# Patient Record
Sex: Male | Born: 1965 | Hispanic: No | Marital: Married | Smoking: Never smoker
Health system: Southern US, Community
[De-identification: ages and names within clinical notes are randomized; demographics above are authoritative.]

## PROBLEM LIST (undated history)

## (undated) DIAGNOSIS — I441 Atrioventricular block, second degree: Secondary | ICD-10-CM

## (undated) DIAGNOSIS — I639 Cerebral infarction, unspecified: Secondary | ICD-10-CM

## (undated) DIAGNOSIS — I4430 Unspecified atrioventricular block: Secondary | ICD-10-CM

## (undated) DIAGNOSIS — R001 Bradycardia, unspecified: Secondary | ICD-10-CM

## (undated) HISTORY — DX: Atrioventricular block, second degree: I44.1

## (undated) HISTORY — DX: Unspecified atrioventricular block: I44.30

## (undated) HISTORY — DX: Bradycardia, unspecified: R00.1

---

## 1998-10-30 ENCOUNTER — Other Ambulatory Visit: Admission: RE | Admit: 1998-10-30 | Discharge: 1998-10-30 | Payer: Self-pay | Admitting: Urology

## 2010-01-21 ENCOUNTER — Encounter: Payer: Self-pay | Admitting: Internal Medicine

## 2010-05-25 ENCOUNTER — Encounter: Payer: Self-pay | Admitting: Cardiology

## 2010-06-17 ENCOUNTER — Encounter: Payer: Self-pay | Admitting: Internal Medicine

## 2010-06-17 ENCOUNTER — Institutional Professional Consult (permissible substitution) (INDEPENDENT_AMBULATORY_CARE_PROVIDER_SITE_OTHER): Payer: BC Managed Care – PPO | Admitting: Internal Medicine

## 2010-06-17 DIAGNOSIS — I441 Atrioventricular block, second degree: Secondary | ICD-10-CM

## 2010-06-24 NOTE — Assessment & Plan Note (Signed)
Summary: sinoatrial node dysfunction/ [facility] [phone]/...   Vital Signs:  Patient profile:   45 year old male Height:      71 inches Weight:      152 pounds BMI:     21.28 Pulse rate:   45 / minute Resp:     16 per minute BP sitting:   131 / 84  (right arm)  Vitals Entered By: Marrion Coy, CNA (June 17, 2010 12:04 PM)  Visit Type:  Initial Consult Referring Provider:  Dr Anne Fu Primary Provider:  Dr Laurann Montana   History of Present Illness: Mr Coolman is a pleasant 45 yo WM with a h/o longstanding bradycardia who presents today for EP consultation.  He reports always noticing that his heart rate was "low".  He states that when giving blood in past (at least 10 years), his heart rate was almost always 40s.  Most recently he evaluated by Dr Cliffton Asters 9/11 for routine physical and was found to be bradycardic.  She did an ekg and found 2:1 AV conduction. The patient states that his energy is preserved.  He remains very active.  He will ride his bike up to 50 miles on occasion, without difficulty.  He finds mild SOB when going up hills, but is able to maintaing activies with good endurance.  He denies lightheadednss, dizziness, presyncope, or syncope.  He also denies CP, SOB, orthopnea, PND,  or edema.  He does not take medicines.  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  Maternal grandfather died suddenly at age 57 but no other FH of sudden death or arrhythmias.  THis grandfather weighed over 300 lbs and was felt to chronically be in poor health.  Social History: Pt lives in [place] with spouse.  Two healthy children.  Works as a Quarry manager.  Tob- none.  ETOH- rare.  Review of Systems       All  systems are reviewed and negative except as listed in the HPI.   Physical Exam  General:  Well developed, well nourished, in no acute distress. Head:  normocephalic and atraumatic Eyes:  PERRLA/EOM intact; conjunctiva and lids normal. Mouth:  Teeth, gums and palate normal. Oral mucosa normal. Neck:  Neck supple, no JVD. No masses, thyromegaly or abnormal cervical nodes. Lungs:  Clear bilaterally to auscultation and percussion. Heart:  brady regular rhythm, no m/r/g Abdomen:  Bowel sounds positive; abdomen soft and non-tender without masses, organomegaly, or hernias noted. No hepatosplenomegaly. Msk:  Back normal, normal gait. Muscle strength and tone normal. Extremities:  No clubbing or cyanosis. Neurologic:  Alert and oriented x 3. Skin:  Intact without lesions or rashes. Psych:  Normal affect.   Echocardiogram  Procedure date:  01/092012  Findings:      Upper normal left ventricular septal wall thickness. There were no regional wall motion abnormalities. Left ventricular ejection fraction est. at 65-70%. Mild mitral valve regugitation. Trivial tricuspid regurgitation. Mildly elevated estemated right ventricular systolic pressure. Upper normal RV size.  Donato Schultz, MD    Impression & Recommendations:  Problem # 1:  ATRIOVENTRICULAR BLOCK, 2ND DEGREE (ICD-426.13) The patient presents with second degree AV block.  He appears to be completely asymptomatic.  He reports heart rates in the 40s for 10 years  or more but has not previously had ekgs.  I suspect that this is therefore a longstanding issue for him.  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  I have reviewed his recent holter monitor in detail with reveals 2:1 AV block with occasional junctional escape beats, but no progression past 2:1 block. I also reviewed the patients GXT performed by Dr Anne Fu.  Interestingly he has persistent 2:1 AV block with  exercise.  He does not convert to 1:1 conduction or have progression to further heart block.  His Echo is benign.   Given that this is longstanding, QRS is very narrow, and he is asypmtomatic, I would not recommend pacemaker implantation at this time.  He will follow closely with Dr Anne Fu.  Should he begin to develop presyncope, syncope, or significant decline in exercise tolerance, then we should consider pacemaker at that time.  I have discussed the patient's case and reviewed ekgs/holters/gxt with both Dr Anne Fu and also Dr Ladona Ridgel who agree with my assessment and recomendations.  I will see the patient as needed.  Patient Instructions: 1)  Your physician recommends that you schedule a follow-up appointment with Dr. Anne Fu. Follow up with Dr. Johney Frame as needed.

## 2010-07-14 NOTE — Letter (Signed)
Summary: [facility] Cardiology 2011  [facility] Cardiology 2011   Imported By: Marylou Mccoy 07/09/2010 14:38:20  _____________________________________________________________________  External Attachment:    Type:   Image     Comment:   External Document

## 2019-08-24 ENCOUNTER — Telehealth: Payer: Self-pay

## 2019-08-24 NOTE — Telephone Encounter (Signed)
Called pt to update fam hx on medical record. Left message asking pt to call the office.

## 2019-08-28 ENCOUNTER — Encounter: Payer: Self-pay | Admitting: Cardiology

## 2019-08-28 ENCOUNTER — Ambulatory Visit: Payer: 59 | Admitting: Cardiology

## 2019-08-28 ENCOUNTER — Other Ambulatory Visit: Payer: Self-pay

## 2019-08-28 ENCOUNTER — Encounter: Payer: Self-pay | Admitting: *Deleted

## 2019-08-28 ENCOUNTER — Telehealth: Payer: Self-pay | Admitting: *Deleted

## 2019-08-28 VITALS — BP 124/90 | HR 46 | Ht 71.0 in | Wt 156.8 lb

## 2019-08-28 DIAGNOSIS — R001 Bradycardia, unspecified: Secondary | ICD-10-CM | POA: Diagnosis not present

## 2019-08-28 DIAGNOSIS — R9431 Abnormal electrocardiogram [ECG] [EKG]: Secondary | ICD-10-CM

## 2019-08-28 DIAGNOSIS — I441 Atrioventricular block, second degree: Secondary | ICD-10-CM | POA: Diagnosis not present

## 2019-08-28 NOTE — Progress Notes (Signed)
Cardiology Office Note:    Date:  08/28/2019   ID:  Luke Jimenez, DOB 26-Mar-1966, MRN [id number]  PCP:  Shon Hale, MD  Cardiologist:  No primary care provider on file.  Electrophysiologist:  None   Referring MD: Shon Hale, *     History of Present Illness:    Luke Jimenez is a 54 y.o. male here for the evaluation of AV block at the request of Dr. Chanetta Marshall.  Today EKG demonstrates second-degree AV block heart rate 46 bpm with 2-1 AV nodal conduction.  Secondary P wave is buried within the end of T wave.  Saw Dr. Johney Frame 2012. Now when going up hill more SOB, recover quickly. Changed a lot since then.  Please see below for further details.  Father died 39 from Parkinson's.  Maternal uncle had CABG age 47.  Never smoked.  He is a Architectural technologist. -LDL 148 creatinine 1.1 potassium 5.5  Past Medical History:  Diagnosis Date  . AV block   . Bradycardia     History reviewed. No pertinent surgical history.  Current Medications: Current Meds  Medication Sig  . cetirizine (ZYRTEC) 10 MG tablet Take 10 mg by mouth daily.     Allergies:   Patient has no known allergies.   Social History   Socioeconomic History  . Marital status: Unknown    Spouse name: Not on file  . Number of children: Not on file  . Years of education: Not on file  . Highest education level: Not on file  Occupational History  . Not on file  Tobacco Use  . Smoking status: Never Smoker  . Smokeless tobacco: Never Used  Substance and Sexual Activity  . Alcohol use: Yes    Comment: 1 drink a week  . Drug use: Never  . Sexual activity: Not on file  Other Topics Concern  . Not on file  Social History Narrative  . Not on file   Social Determinants of Health   Financial Resource Strain:   . Difficulty of Paying Living Expenses:   Food Insecurity:   . Worried About Programme researcher, broadcasting/film/video in the Last Year:   . Barista in the Last Year:   Transportation Needs:   . Sales promotion account executive (Medical):   Marland Kitchen Lack of Transportation (Non-Medical):   Physical Activity:   . Days of Exercise per Week:   . Minutes of Exercise per Session:   Stress:   . Feeling of Stress :   Social Connections:   . Frequency of Communication with Friends and Family:   . Frequency of Social Gatherings with Friends and Family:   . Attends Religious Services:   . Active Member of Clubs or Organizations:   . Attends Banker Meetings:   Marland Kitchen Marital Status:      Family History: The patient's family history includes Heart disease in his maternal grandfather.  ROS:   Please see the history of present illness.    Denies any syncope, chest pain, orthopnea, PND all other systems reviewed and are negative.  EKGs/Labs/Other Studies Reviewed:    The following studies were reviewed today: Prior exercise treadmill test office notes consultation with EP Dr. Johney Frame lab work reviewed  EKG:  EKG is  ordered today.  The ekg ordered today demonstrates 2-1 AV conduction heart rate 46 bpm, P wave buried within T wave  Recent Labs: No results found for requested labs within last 8760 hours.  Recent Lipid Panel  No results found for: CHOL, TRIG, HDL, CHOLHDL, VLDL, LDLCALC, LDLDIRECT  Physical Exam:    VS:  BP 124/90   Pulse (!) 46   Ht 5\' 11"  (1.803 m)   Wt 156 lb 12.8 oz (71.1 kg)   SpO2 99%   BMI 21.87 kg/m     Wt Readings from Last 3 Encounters:  08/28/19 156 lb 12.8 oz (71.1 kg)  06/17/10 152 lb (68.9 kg)     GEN:  Well nourished, well developed in no acute distress HEENT: Normal NECK: No JVD; No carotid bruits LYMPHATICS: No lymphadenopathy CARDIAC: Bradycardic regular, no murmurs, rubs, gallops RESPIRATORY:  Clear to auscultation without rales, wheezing or rhonchi  ABDOMEN: Soft, non-tender, non-distended MUSCULOSKELETAL:  No edema; No deformity  SKIN: Warm and dry NEUROLOGIC:  Alert and oriented x 3 PSYCHIATRIC:  Normal affect   ASSESSMENT:    1. Heart  block AV second degree   2. Bradycardia   3. Nonspecific abnormal electrocardiogram (ECG) (EKG)    PLAN:    In order of problems listed above:  Second-degree heart block -2-1 AV nodal conduction.  He has been this way for years.  In 2012 saw Dr. Rayann Heman.  Similar symptoms back then.  Hills during activity would be troublesome.  They have however 10 years later become even harder exertional effort.  His wife and he enjoys hiking and she usually has better stamina going uphill than he does.  He recovers quite quickly however when he needs to stop.  He has not had any dangerous or adverse syncopal episodes.  No chest discomfort with this.  Prior exercise treadmill test showed continuation of 2-1 AV block over 10 to 12 minutes of exercise.  Exercise treadmill reviewed from 2012.  Holter monitor showed no evidence of worsening heart block previously. -I will go ahead and check an echocardiogram to ensure continued proper structure and function of his heart.  Prior echocardiogram showed EF of 70%. -I will also check a Zio patch monitor to ensure that there are no worsening conduction difficulties.  I have asked him to exercise with this patch monitor to see what his peak heart rate is. -Ultimately, we may have him revisit with Dr. Rayann Heman.  I explained to him that a pacemaker may be warranted at some point but we have to figure out what that inflection point is.  Nevertheless, we will have a year follow-up.  I will follow up with monitor and echo.   Medication Adjustments/Labs and Tests Ordered: Current medicines are reviewed at length with the patient today.  Concerns regarding medicines are outlined above.  Orders Placed This Encounter  Procedures  . LONG TERM MONITOR (3-14 DAYS)  . EKG 12-Lead  . ECHOCARDIOGRAM COMPLETE   No orders of the defined types were placed in this encounter.   Patient Instructions  Medication Instructions:  NO CHANGES *If you need a refill on your cardiac  medications before your next appointment, please call your pharmacy*    Testing/Procedures: Your physician has requested that you have an echocardiogram. Echocardiography is a painless test that uses sound waves to create images of your heart. It provides your doctor with information about the size and shape of your heart and how well your heart's chambers and valves are working. This procedure takes approximately one hour. There are no restrictions for this procedure.   ZIO PATCH MONITOR  14 DAY   Follow-Up: At [facility], you and your health needs are our priority.  As part of our  continuing mission to provide you with exceptional heart care, we have created designated Provider Care Teams.  These Care Teams include your primary Cardiologist (physician) and Advanced Practice Providers (APPs -  Physician Assistants and Nurse Practitioners) who all work together to provide you with the care you need, when you need it.  We recommend signing up for the patient portal called "MyChart".  Sign up information is provided on this After Visit Summary.  MyChart is used to connect with patients for Virtual Visits (Telemedicine).  Patients are able to view lab/test results, encounter notes, upcoming appointments, etc.  Non-urgent messages can be sent to your provider as well.   To learn more about what you can do with MyChart, go to [email or web address].    Your next appointment:   1 year(s)  The format for your next appointment:   In Person  Provider:   Donato Schultz, MD   Other Instructions NONE     Signed, Donato Schultz, MD  08/28/2019 9:28 AM    [facility]

## 2019-08-28 NOTE — Telephone Encounter (Signed)
Patient enrolled for [employer] to mail a 14 day ZIO XT long term holter monitor to his home.  Instructions mailed to his home and will also be included in his monitor kit.

## 2019-08-28 NOTE — Patient Instructions (Signed)
Medication Instructions:  NO CHANGES *If you need a refill on your cardiac medications before your next appointment, please call your pharmacy*    Testing/Procedures: Your physician has requested that you have an echocardiogram. Echocardiography is a painless test that uses sound waves to create images of your heart. It provides your doctor with information about the size and shape of your heart and how well your heart's chambers and valves are working. This procedure takes approximately one hour. There are no restrictions for this procedure.   ZIO PATCH MONITOR  14 DAY   Follow-Up: At [facility], you and your health needs are our priority.  As part of our continuing mission to provide you with exceptional heart care, we have created designated Provider Care Teams.  These Care Teams include your primary Cardiologist (physician) and Advanced Practice Providers (APPs -  Physician Assistants and Nurse Practitioners) who all work together to provide you with the care you need, when you need it.  We recommend signing up for the patient portal called "MyChart".  Sign up information is provided on this After Visit Summary.  MyChart is used to connect with patients for Virtual Visits (Telemedicine).  Patients are able to view lab/test results, encounter notes, upcoming appointments, etc.  Non-urgent messages can be sent to your provider as well.   To learn more about what you can do with MyChart, go to [email or web address].    Your next appointment:   1 year(s)  The format for your next appointment:   In Person  Provider:   Donato Schultz, MD   Other Instructions NONE

## 2019-09-06 ENCOUNTER — Other Ambulatory Visit (INDEPENDENT_AMBULATORY_CARE_PROVIDER_SITE_OTHER): Payer: 59

## 2019-09-06 ENCOUNTER — Ambulatory Visit (HOSPITAL_COMMUNITY): Payer: 59 | Attending: Cardiovascular Disease

## 2019-09-06 ENCOUNTER — Other Ambulatory Visit: Payer: Self-pay

## 2019-09-06 DIAGNOSIS — R9431 Abnormal electrocardiogram [ECG] [EKG]: Secondary | ICD-10-CM

## 2019-09-06 DIAGNOSIS — I441 Atrioventricular block, second degree: Secondary | ICD-10-CM | POA: Diagnosis not present

## 2019-09-06 DIAGNOSIS — R001 Bradycardia, unspecified: Secondary | ICD-10-CM

## 2019-10-05 ENCOUNTER — Telehealth: Payer: Self-pay | Admitting: Cardiology

## 2019-10-05 NOTE — Telephone Encounter (Signed)
Luke Jimenez..calling with abnormal end of service summary  Possible complete heart block HR 38 bpm...11:52 am 09/11/19.   Pt wore for 13 days --last day 09/20/19.    14 pauses during that time.   Longest pause was for 3.6 seconds on 09/07/19 at 12:56 am.  Report is available on [employer] site.

## 2019-10-05 NOTE — Telephone Encounter (Signed)
New Message  Luke Jimenez is calling to report an abnormal zio patch result

## 2019-10-10 ENCOUNTER — Other Ambulatory Visit: Payer: Self-pay | Admitting: *Deleted

## 2019-10-10 DIAGNOSIS — I441 Atrioventricular block, second degree: Secondary | ICD-10-CM

## 2019-10-10 DIAGNOSIS — R001 Bradycardia, unspecified: Secondary | ICD-10-CM

## 2019-10-10 NOTE — Progress Notes (Signed)
Order for referral to Dr Johney Frame

## 2019-10-30 ENCOUNTER — Telehealth: Payer: Self-pay

## 2019-10-30 NOTE — Telephone Encounter (Signed)
Spoke with pt about his virtuall appt on 10/31/19. Pt stated he can not check his BP. Pt was informed that is not a problem. Pt agreed and confirmed virtual appt.

## 2019-10-31 ENCOUNTER — Encounter: Payer: Self-pay | Admitting: Internal Medicine

## 2019-10-31 ENCOUNTER — Telehealth (INDEPENDENT_AMBULATORY_CARE_PROVIDER_SITE_OTHER): Payer: No Typology Code available for payment source | Admitting: Internal Medicine

## 2019-10-31 ENCOUNTER — Other Ambulatory Visit: Payer: Self-pay

## 2019-10-31 VITALS — Ht 70.0 in | Wt 150.0 lb

## 2019-10-31 DIAGNOSIS — I441 Atrioventricular block, second degree: Secondary | ICD-10-CM | POA: Diagnosis not present

## 2019-10-31 NOTE — Progress Notes (Signed)
Electrophysiology TeleHealth Note   Due to national recommendations of social distancing due to [place] 19, Audio/video telehealth visit is felt to be most appropriate for this patient at this time.  See MyChart message from today for patient consent regarding telehealth for [name].   Date:  11/07/2019   ID:  Luke Jimenez, DOB 1965/12/29, MRN [id number]  Location: home Provider location: [place] [facility] Evaluation Performed: New patient consult  PCP:  Glenis Smoker, MD  Cardiologist:  Dr Marlou Porch Electrophysiologist:  Dr Rayann Heman  Chief Complaint:  bradycardia  History of Present Illness:    Luke Jimenez is a 54 y.o. male who presents via audio/video conferencing for a telehealth visit today.   The patient is referred for new consultation regarding bradycardia by Dr Marlou Porch.  I saw him previously in 2012 (my note reviewed). The patient has chronic second degree AV block.  He is very active and minimally symptomatic. He continues to hike and exercise without limitation.  He does have occasional fatigue but attributes this to "getting older".  He has had NO dizziness, presyncope or syncope.  He is able to do all of his desired activities.   Today, he denies symptoms of palpitations, chest pain, shortness of breath, orthopnea, PND, lower extremity edema, claudication, dizziness, presyncope, syncope, bleeding, or neurologic sequela. The patient is tolerating medications without difficulties and is otherwise without complaint today.     Past Medical History:  Diagnosis Date  . Bradycardia   . Second degree AV block     History reviewed. No pertinent surgical history.  Current Outpatient Medications  Medication Sig Dispense Refill  . cetirizine (ZYRTEC) 10 MG tablet Take 10 mg by mouth daily.    . chlorpheniramine (CHLOR-TRIMETON) 4 MG tablet Take 4 mg by mouth daily.     No current facility-administered medications for this visit.    Allergies:   Patient has no known  allergies.   Social History:  The patient  reports that he has never smoked. He has never used smokeless tobacco. He reports current alcohol use. He reports that he does not use drugs.   Family History:  The patient's  family history includes Heart disease in his maternal grandfather.    ROS:  Please see the history of present illness.   All other systems are personally reviewed and negative.    Exam:    Vital Signs:  Ht 5\' 10"  (1.778 m)   Wt 150 lb (68 kg)   BMI 21.52 kg/m    Well appearing, alert and conversant, regular work of breathing,  good skin color Eyes- anicteric, neuro- grossly intact, skin- no apparent rash or lesions or cyanosis, mouth- oral mucosa is pink   Labs/Other Tests and Data Reviewed:    Recent Labs: No results found for requested labs within last 8760 hours.   Wt Readings from Last 3 Encounters:  10/31/19 150 lb (68 kg)  08/28/19 156 lb 12.8 oz (71.1 kg)  06/17/10 152 lb (68.9 kg)     Other studies personally reviewed: Additional studies/ records that were reviewed today include: my prior notes,  Dr Marlou Porch notes,  Recent event monitor  Review of the above records today demonstrates: as above   ASSESSMENT & PLAN:    1.  Second degree AV block He has documented 2:1 AV block for at least 10 years.  He is minimally symptomatic.  His QRS is narrow and suggests that the issue is within the AV note. Recent event monitor reveals bradycardia  with pauses, which are primarily nocturnal.  He has had no daytime symptomatic pauses. We discussed at length today with his wife on the phone. I did mention risks of worsening AV block with syncope or even death as a possible sequelae.  I have offered PPM today.  We could proceed with pacing with left bundle pacing technique.  I do not think that leadless pacing would allow desired AV tracking and therefore would not be the best option for him. Risks and benefits to PPM were discussed at length including risks of  bleeding and infection.  He is clear that he would not like to proceed with pacing at this time but will contact my office if he changes his mind.  He will follow with Dr Anne Fu and I will see as needed.   Today, I have spent 20 minutes with the patient with telehealth technology discussing AV block .    Signed, Hillis Range MD, [facility] 11/07/2019 9:59 AM   [name] [address] [name] [address] [phone] (office) [phone] (fax)

## 2019-11-07 ENCOUNTER — Encounter: Payer: Self-pay | Admitting: Internal Medicine

## 2019-11-21 ENCOUNTER — Telehealth: Payer: Self-pay

## 2019-11-21 DIAGNOSIS — I441 Atrioventricular block, second degree: Secondary | ICD-10-CM

## 2019-11-21 NOTE — Telephone Encounter (Signed)
Pt scheduled for PPM on August 5  Will get labs/soap august 2  Instruction letter sent  Work up complete

## 2019-12-17 ENCOUNTER — Other Ambulatory Visit: Payer: No Typology Code available for payment source

## 2019-12-17 ENCOUNTER — Telehealth: Payer: Self-pay | Admitting: *Deleted

## 2019-12-17 ENCOUNTER — Other Ambulatory Visit: Payer: Self-pay

## 2019-12-17 DIAGNOSIS — I441 Atrioventricular block, second degree: Secondary | ICD-10-CM

## 2019-12-17 LAB — BASIC METABOLIC PANEL
BUN/Creatinine Ratio: 20 (ref 9–20)
BUN: 25 mg/dL — ABNORMAL HIGH (ref 6–24)
CO2: 22 mmol/L (ref 20–29)
Calcium: 9.8 mg/dL (ref 8.7–10.2)
Chloride: 102 mmol/L (ref 96–106)
Creatinine, Ser: 1.26 mg/dL (ref 0.76–1.27)
GFR calc Af Amer: 73 mL/min/{1.73_m2} (ref >59)
GFR calc non Af Amer: 63 mL/min/{1.73_m2} (ref >59)
Glucose: 93 mg/dL (ref 65–99)
Potassium: 4.5 mmol/L (ref 3.5–5.2)
Sodium: 137 mmol/L (ref 134–144)

## 2019-12-17 LAB — CBC WITH DIFFERENTIAL/PLATELET
Basophils Absolute: 0 10*3/uL (ref 0.0–0.2)
Basos: 1 %
EOS (ABSOLUTE): 0.1 10*3/uL (ref 0.0–0.4)
Eos: 2 %
Hematocrit: 35.6 % — ABNORMAL LOW (ref 37.5–51.0)
Hemoglobin: 11.7 g/dL — ABNORMAL LOW (ref 13.0–17.7)
Lymphocytes Absolute: 1.9 10*3/uL (ref 0.7–3.1)
Lymphs: 34 %
MCH: 25.4 pg — ABNORMAL LOW (ref 26.6–33.0)
MCHC: 32.9 g/dL (ref 31.5–35.7)
MCV: 77 fL — ABNORMAL LOW (ref 79–97)
Monocytes Absolute: 0.7 10*3/uL (ref 0.1–0.9)
Monocytes: 13 %
Neutrophils Absolute: 2.8 10*3/uL (ref 1.4–7.0)
Neutrophils: 50 %
Platelets: 334 10*3/uL (ref 150–450)
RBC: 4.6 x10E6/uL (ref 4.14–5.80)
RDW: 18.5 % — ABNORMAL HIGH (ref 11.6–15.4)
WBC: 5.5 10*3/uL (ref 3.4–10.8)

## 2019-12-17 NOTE — Telephone Encounter (Signed)
Notified pt with update procedure time of 5:30 am Aug 5th.   Patient agreeable

## 2019-12-20 ENCOUNTER — Encounter (HOSPITAL_COMMUNITY): Payer: Self-pay | Admitting: Internal Medicine

## 2019-12-20 ENCOUNTER — Ambulatory Visit (HOSPITAL_COMMUNITY)
Admission: RE | Admit: 2019-12-20 | Discharge: 2019-12-20 | Disposition: A | Discharge status: Home / Self Care | Payer: No Typology Code available for payment source | Attending: Internal Medicine | Admitting: Internal Medicine

## 2019-12-20 ENCOUNTER — Ambulatory Visit (HOSPITAL_COMMUNITY): Payer: No Typology Code available for payment source

## 2019-12-20 ENCOUNTER — Encounter (HOSPITAL_COMMUNITY)
Admission: RE | Disposition: A | Discharge status: Home / Self Care | Payer: Self-pay | Source: Home / Self Care | Attending: Internal Medicine

## 2019-12-20 ENCOUNTER — Other Ambulatory Visit: Payer: Self-pay

## 2019-12-20 DIAGNOSIS — Z950 Presence of cardiac pacemaker: Secondary | ICD-10-CM

## 2019-12-20 DIAGNOSIS — Z20822 Contact with and (suspected) exposure to covid-19: Secondary | ICD-10-CM | POA: Diagnosis not present

## 2019-12-20 DIAGNOSIS — R001 Bradycardia, unspecified: Secondary | ICD-10-CM | POA: Insufficient documentation

## 2019-12-20 DIAGNOSIS — I441 Atrioventricular block, second degree: Secondary | ICD-10-CM

## 2019-12-20 HISTORY — PX: PACEMAKER IMPLANT: EP1218

## 2019-12-20 LAB — SARS CORONAVIRUS 2 BY RT PCR (HOSPITAL ORDER, PERFORMED IN ~~LOC~~ HOSPITAL LAB): SARS Coronavirus 2: NEGATIVE

## 2019-12-20 SURGERY — PACEMAKER IMPLANT

## 2019-12-20 MED ORDER — MIDAZOLAM HCL 5 MG/5ML IJ SOLN
INTRAMUSCULAR | Status: DC | PRN
  Administered 2019-12-20 (×2): 1 mg via INTRAVENOUS
  Administered 2019-12-20: 2 mg via INTRAVENOUS

## 2019-12-20 MED ORDER — HEPARIN (PORCINE) IN NACL 1000-0.9 UT/500ML-% IV SOLN
INTRAVENOUS | Status: DC | PRN
  Administered 2019-12-20: 500 mL

## 2019-12-20 MED ORDER — LIDOCAINE HCL (PF) 1 % IJ SOLN
INTRAMUSCULAR | Status: DC | PRN
  Administered 2019-12-20: 50 mL

## 2019-12-20 MED ORDER — HEPARIN (PORCINE) IN NACL 1000-0.9 UT/500ML-% IV SOLN
INTRAVENOUS | Status: AC
  Filled 2019-12-20: qty 500

## 2019-12-20 MED ORDER — FENTANYL CITRATE (PF) 100 MCG/2ML IJ SOLN
INTRAMUSCULAR | Status: DC | PRN
  Administered 2019-12-20: 25 ug via INTRAVENOUS

## 2019-12-20 MED ORDER — CEFAZOLIN SODIUM-DEXTROSE 2-4 GM/100ML-% IV SOLN
2.0000 g | INTRAVENOUS | Status: AC
  Administered 2019-12-20: 2 g via INTRAVENOUS

## 2019-12-20 MED ORDER — LIDOCAINE HCL (PF) 1 % IJ SOLN
INTRAMUSCULAR | Status: AC
  Filled 2019-12-20: qty 30

## 2019-12-20 MED ORDER — SODIUM CHLORIDE 0.9 % IV SOLN: 250.0000 mL | INTRAVENOUS | Status: DC | PRN

## 2019-12-20 MED ORDER — CEFAZOLIN SODIUM-DEXTROSE 2-4 GM/100ML-% IV SOLN
INTRAVENOUS | Status: AC
  Filled 2019-12-20: qty 100

## 2019-12-20 MED ORDER — SODIUM CHLORIDE 0.9 % IV SOLN
INTRAVENOUS | Status: AC
  Filled 2019-12-20: qty 2

## 2019-12-20 MED ORDER — ACETAMINOPHEN 325 MG PO TABS
325.0000 mg | ORAL_TABLET | ORAL | Status: DC | PRN
  Filled 2019-12-20: qty 2

## 2019-12-20 MED ORDER — SODIUM CHLORIDE 0.9 % IV SOLN
80.0000 mg | INTRAVENOUS | Status: AC
  Administered 2019-12-20: 80 mg
  Filled 2019-12-20: qty 2

## 2019-12-20 MED ORDER — SODIUM CHLORIDE 0.9% FLUSH: 3.0000 mL | INTRAVENOUS | Status: DC | PRN

## 2019-12-20 MED ORDER — CHLORHEXIDINE GLUCONATE 4 % EX LIQD: 4.0000 "application " | Freq: Once | CUTANEOUS | Status: DC

## 2019-12-20 MED ORDER — FENTANYL CITRATE (PF) 100 MCG/2ML IJ SOLN
INTRAMUSCULAR | Status: AC
  Filled 2019-12-20: qty 2

## 2019-12-20 MED ORDER — SODIUM CHLORIDE 0.9% FLUSH: 3.0000 mL | Freq: Two times a day (BID) | INTRAVENOUS | Status: DC

## 2019-12-20 MED ORDER — SODIUM CHLORIDE 0.9 % IV SOLN: INTRAVENOUS | Status: DC

## 2019-12-20 MED ORDER — MIDAZOLAM HCL 5 MG/5ML IJ SOLN
INTRAMUSCULAR | Status: AC
  Filled 2019-12-20: qty 5

## 2019-12-20 MED ORDER — ONDANSETRON HCL 4 MG/2ML IJ SOLN: 4.0000 mg | Freq: Four times a day (QID) | INTRAMUSCULAR | Status: DC | PRN

## 2019-12-20 SURGICAL SUPPLY — 12 items
CABLE SURGICAL S-101-97-12 (CABLE) ×2 IMPLANT
CATH RIGHTSITE C315HIS02 (CATHETERS) ×2 IMPLANT
IPG PACE AZUR XT DR MRI W1DR01 (Pacemaker) IMPLANT
LEAD CAPSURE NOVUS 5076-52CM (Lead) ×1 IMPLANT
LEAD SELECT SECURE 3830 383069 (Lead) IMPLANT
PACE AZURE XT DR MRI W1DR01 (Pacemaker) ×2 IMPLANT
PAD PRO RADIOLUCENT 2001M-C (PAD) ×2 IMPLANT
SELECT SECURE 3830 383069 (Lead) ×2 IMPLANT
SHEATH 7FR PRELUDE SNAP 13 (SHEATH) ×2 IMPLANT
SLITTER 6232ADJ (MISCELLANEOUS) ×1 IMPLANT
TRAY PACEMAKER INSERTION (PACKS) ×2 IMPLANT
WIRE HI TORQ VERSACORE-J 145CM (WIRE) ×1 IMPLANT

## 2019-12-20 NOTE — Progress Notes (Signed)
Pt back from CXR, no complications.

## 2019-12-20 NOTE — Discharge Instructions (Signed)
    Supplemental Discharge Instructions for  Pacemaker/Defibrillator Patients   Tomorrow, 12/21/2019, PLEASE SEND A REMOTE DEVICE TRANSMISSION    Activity No heavy lifting or vigorous activity with your left/right arm for 6 to 8 weeks.  Do not raise your left/right arm above your head for one week.  Gradually raise your affected arm as drawn below.             12/24/2019                   12/25/2019                12/26/2019               12/27/2019              __  NO DRIVING for  1 week  ; you may begin driving on  [id number]  .  WOUND CARE - Keep the wound area clean and dry.  Do not get this area wet, no showersuntil cleared to at your wound check visit - Tomorrow, 12/21/2019 remove the arm sling - Tomorrow, 12/21/2019, remove the outer plastic bandage.  Underneath there are steri strips, DO NOT remove these - The tape/steri-strips on your wound will fall off; do not pull them off.  No bandage is needed on the site.  DO  NOT apply any creams, oils, or ointments to the wound area. - If you notice any drainage or discharge from the wound, any swelling or bruising at the site, or you develop a fever > 101? F after you are discharged home, call the office at once.  Special Instructions - You are still able to use cellular telephones; use the ear opposite the side where you have your pacemaker/defibrillator.  Avoid carrying your cellular phone near your device. - When traveling through airports, show security personnel your identification card to avoid being screened in the metal detectors.  Ask the security personnel to use the hand wand. - Avoid arc welding equipment, MRI testing (magnetic resonance imaging), TENS units (transcutaneous nerve stimulators).  Call the office for questions about other devices. - Avoid electrical appliances that are in poor condition or are not properly grounded. - Microwave ovens are safe to be near or to operate.

## 2019-12-20 NOTE — H&P (Signed)
Chief Complaint:  bradycardia  History of Present Illness:    Luke Jimenez is a 54 y.o. male who presents for pacemaker implantation.   The patient has chronic second degree AV block.  He is very active and minimally symptomatic. He continues to hike and exercise without limitation.  He does have occasional fatigue but attributes this to "getting older".  He has had NO dizziness, presyncope or syncope.  He is able to do all of his desired activities.   Today, he denies symptoms of palpitations, chest pain, shortness of breath, orthopnea, PND, lower extremity edema, claudication, dizziness, presyncope, syncope, bleeding, or neurologic sequela. The patient is tolerating medications without difficulties and is otherwise without complaint today.         Past Medical History:  Diagnosis Date  . Bradycardia   . Second degree AV block     History reviewed. No pertinent surgical history.        Current Outpatient Medications  Medication Sig Dispense Refill  . cetirizine (ZYRTEC) 10 MG tablet Take 10 mg by mouth daily.    . chlorpheniramine (CHLOR-TRIMETON) 4 MG tablet Take 4 mg by mouth daily.     No current facility-administered medications for this visit.    Allergies:   Patient has no known allergies.   Social History:  The patient  reports that he has never smoked. He has never used smokeless tobacco. He reports current alcohol use. He reports that he does not use drugs.   Family History:  The patient's  family history includes Heart disease in his maternal grandfather.    ROS:  Please see the history of present illness.   All other systems are personally reviewed and negative.   Marland Kitchen Physical Exam: Vitals:   12/20/19 0542  Pulse: (!) 49  Resp: 16  Temp: 98.1 F (36.7 C)  TempSrc: Oral  SpO2: 100%  Weight: 68 kg  Height: 5\' 10"  (1.778 m)    GEN- The patient is well appearing, alert and oriented x 3 today.   Head- normocephalic, atraumatic Eyes-   Sclera clear, conjunctiva pink Ears- hearing intact Oropharynx- clear Neck- supple, Lungs-  , normal work of breathing Heart- bradycardic rhythm  GI- soft  Extremities- no clubbing, cyanosis, or edema, groin is without hematoma    Labs/Other Tests and Data Reviewed:    Recent Labs: No results found for requested labs within last 8760 hours.      Wt Readings from Last 3 Encounters:  10/31/19 150 lb (68 kg)  08/28/19 156 lb 12.8 oz (71.1 kg)  06/17/10 152 lb (68.9 kg)      ASSESSMENT & PLAN:    1.  Second degree AV block He has documented 2:1 AV block for at least 10 years.  He is minimally symptomatic.  His QRS is narrow and suggests that the issue is within the AV note. Recent event monitor reveals bradycardia with pauses, which are primarily nocturnal.  He has had no daytime symptomatic pauses. The patient has symptomatic bradycardia.  I would therefore recommend pacemaker implantation at this time.  Risks, benefits, alternatives to pacemaker implantation were discussed in detail with the patient today. The patient understands that the risks include but are not limited to bleeding, infection, pneumothorax, perforation, tamponade, vascular damage, renal failure, MI, stroke, death,  and lead dislodgement and wishes to proceed.   08/16/10 MD, [facility] 12/20/2019 7:28 AM

## 2019-12-20 NOTE — Progress Notes (Addendum)
pts bp elevated, manual x 2 right arm 20 minutes apart 146/97, 150/98, pt states that manual cuffs usually read him higher than his normal, auto cuff now on right ankle, I paged renee Macky Lower PA EP to inform.  Update was completed, no further orders followed, will continue to f/u with manual pressures. 1130 Dr Johney Frame said pt can leave early.

## 2020-01-01 ENCOUNTER — Other Ambulatory Visit: Payer: Self-pay

## 2020-01-01 ENCOUNTER — Ambulatory Visit (INDEPENDENT_AMBULATORY_CARE_PROVIDER_SITE_OTHER): Payer: No Typology Code available for payment source | Admitting: Emergency Medicine

## 2020-01-01 DIAGNOSIS — I442 Atrioventricular block, complete: Secondary | ICD-10-CM | POA: Diagnosis not present

## 2020-01-01 DIAGNOSIS — Z950 Presence of cardiac pacemaker: Secondary | ICD-10-CM

## 2020-01-09 LAB — CUP PACEART INCLINIC DEVICE CHECK
Battery Remaining Longevity: 129 mo
Battery Voltage: 3.22 V
Brady Statistic AP VP Percent: 0.27 %
Brady Statistic AP VS Percent: 0.01 %
Brady Statistic AS VP Percent: 99.63 %
Brady Statistic AS VS Percent: 0.1 %
Brady Statistic RA Percent Paced: 0.32 %
Brady Statistic RV Percent Paced: 99.89 %
Date Time Interrogation Session: 20210817103700
Implantable Lead Implant Date: 20210805
Implantable Lead Implant Date: 20210805
Implantable Lead Location: 753859
Implantable Lead Location: 753860
Implantable Lead Model: 3830
Implantable Lead Model: 5076
Implantable Pulse Generator Implant Date: 20210805
Lead Channel Impedance Value: 323 Ohm
Lead Channel Impedance Value: 380 Ohm
Lead Channel Impedance Value: 418 Ohm
Lead Channel Impedance Value: 513 Ohm
Lead Channel Pacing Threshold Amplitude: 0.5 V
Lead Channel Pacing Threshold Amplitude: 0.75 V
Lead Channel Pacing Threshold Pulse Width: 0.4 ms
Lead Channel Pacing Threshold Pulse Width: 0.4 ms
Lead Channel Sensing Intrinsic Amplitude: 2.75 mV
Lead Channel Sensing Intrinsic Amplitude: 7.125 mV
Lead Channel Setting Pacing Amplitude: 3.5 V
Lead Channel Setting Pacing Amplitude: 3.5 V
Lead Channel Setting Pacing Pulse Width: 0.4 ms
Lead Channel Setting Sensing Sensitivity: 0.9 mV

## 2020-01-09 NOTE — Progress Notes (Signed)
Wound check appointment. Steri-strips removed. Wound without redness or edema. Incision edges approximated, wound well healed. Normal device function. Thresholds, sensing, and impedances consistent with implant measurements. Device programmed at 3.5V/auto capture programmed on for extra safety margin until 3 month visit. Histogram distribution appropriate for patient and level of activity. AT/AF burden < 0.1%, AT/AF episodes with EGMs that show AT. Presenting rhythm today is AT/ VP , DR Camnitz (DOD) aware , no change in treatment plan at this time.No high ventricular rates noted. Patient educated about wound care, arm mobility, lifting restrictions. ROV with Dr Johney Frame 03/27/20. Enrolled in remote monitoring and next remote 03/19/20.

## 2020-03-24 ENCOUNTER — Ambulatory Visit (INDEPENDENT_AMBULATORY_CARE_PROVIDER_SITE_OTHER): Payer: No Typology Code available for payment source

## 2020-03-24 DIAGNOSIS — I442 Atrioventricular block, complete: Secondary | ICD-10-CM

## 2020-03-26 LAB — CUP PACEART REMOTE DEVICE CHECK
Battery Remaining Longevity: 75 mo
Battery Voltage: 3.14 V
Brady Statistic AP VP Percent: 1.19 %
Brady Statistic AP VS Percent: 0.01 %
Brady Statistic AS VP Percent: 98.44 %
Brady Statistic AS VS Percent: 0.35 %
Brady Statistic RA Percent Paced: 1.98 %
Brady Statistic RV Percent Paced: 99.57 %
Date Time Interrogation Session: 20211108002239
Implantable Lead Implant Date: 20210805
Implantable Lead Implant Date: 20210805
Implantable Lead Location: 753859
Implantable Lead Location: 753860
Implantable Lead Model: 3830
Implantable Lead Model: 5076
Implantable Pulse Generator Implant Date: 20210805
Lead Channel Impedance Value: 342 Ohm
Lead Channel Impedance Value: 380 Ohm
Lead Channel Impedance Value: 475 Ohm
Lead Channel Impedance Value: 494 Ohm
Lead Channel Pacing Threshold Amplitude: 0.375 V
Lead Channel Pacing Threshold Amplitude: 2.25 V
Lead Channel Pacing Threshold Pulse Width: 0.4 ms
Lead Channel Pacing Threshold Pulse Width: 0.4 ms
Lead Channel Sensing Intrinsic Amplitude: 3 mV
Lead Channel Sensing Intrinsic Amplitude: 3 mV
Lead Channel Sensing Intrinsic Amplitude: 7.75 mV
Lead Channel Sensing Intrinsic Amplitude: 7.75 mV
Lead Channel Setting Pacing Amplitude: 3.5 V
Lead Channel Setting Pacing Amplitude: 4.5 V
Lead Channel Setting Pacing Pulse Width: 0.4 ms
Lead Channel Setting Sensing Sensitivity: 0.9 mV

## 2020-03-26 NOTE — Progress Notes (Signed)
Remote pacemaker transmission.   

## 2020-03-27 ENCOUNTER — Other Ambulatory Visit: Payer: Self-pay

## 2020-03-27 ENCOUNTER — Encounter: Payer: Self-pay | Admitting: *Deleted

## 2020-03-27 ENCOUNTER — Ambulatory Visit: Payer: No Typology Code available for payment source | Admitting: Internal Medicine

## 2020-03-27 ENCOUNTER — Encounter: Payer: Self-pay | Admitting: Internal Medicine

## 2020-03-27 VITALS — BP 130/96 | HR 106 | Ht 70.0 in | Wt 151.6 lb

## 2020-03-27 DIAGNOSIS — I442 Atrioventricular block, complete: Secondary | ICD-10-CM | POA: Diagnosis not present

## 2020-03-27 NOTE — Progress Notes (Signed)
      Primary EP:  Dr Johney Frame  Luke Jimenez is a 54 y.o. male who presents today for routine electrophysiology followup.  Since his ppm implant, the patient reports doing very well. No concerns today. Today, he denies symptoms of palpitations, chest pain, shortness of breath,  lower extremity edema, dizziness, presyncope, or syncope.  The patient is otherwise without complaint today.   Past Medical History:  Diagnosis Date  . Bradycardia   . Second degree AV block    Past Surgical History:  Procedure Laterality Date  . PACEMAKER IMPLANT N/A 12/20/2019   Procedure: PACEMAKER IMPLANT;  Surgeon: Hillis Range, MD;  Location: [facility];  Service: Cardiovascular;  Laterality: N/A;    ROS- all systems are reviewed and negative except as per HPI above  Current Outpatient Medications  Medication Sig Dispense Refill  . cetirizine (ZYRTEC) 10 MG tablet Take 10 mg by mouth daily.    . chlorpheniramine (CHLOR-TRIMETON) 4 MG tablet Take 4 mg by mouth daily.    . fluticasone (FLONASE) 50 MCG/ACT nasal spray Place 1 spray into both nostrils daily as needed for allergies or rhinitis.     No current facility-administered medications for this visit.    Physical Exam: Vitals:   03/27/20 0941  BP: (!) 130/96  Pulse: (!) 106  SpO2: 99%  Weight: 151 lb 9.6 oz (68.8 kg)  Height: 5\' 10"  (1.778 m)    GEN- The patient is well appearing, alert and oriented x 3 today.   Head- normocephalic, atraumatic Eyes-  Sclera clear, conjunctiva pink Ears- hearing intact Oropharynx- clear Lungs- Clear to ausculation bilaterally, normal work of breathing Chest- pacemaker pocket is well healed Heart- Regular rate and rhythm, no murmurs, rubs or gallops, PMI not laterally displaced GI- soft, NT, ND, + BS Extremities- no clubbing, cyanosis, or edema  Pacemaker interrogation- reviewed in detail today,  See PACEART report  ekg tracing ordered today is personally reviewed and shows sinus tachycardia, V  pacing  Assessment and Plan:  1. Symptomatic  complete heart block Normal pacemaker function with left bundle pacing He had far field atrial sensing with inappropriate af detection.  I have adjusted to partial plus today to reduce oversening. See Art report No changes today he is not device dependant today  2. Sinus tachycardia Asymptomatic  Risks, benefits and potential toxicities for medications prescribed and/or refilled reviewed with patient today.   Arita Miss MD, [facility] 03/27/2020 10:12 AM

## 2020-03-27 NOTE — Patient Instructions (Addendum)
Medication Instructions:  Your physician recommends that you continue on your current medications as directed. Please refer to the Current Medication list given to you today.  *If you need a refill on your cardiac medications before your next appointment, please call your pharmacy*  Lab Work: None ordered.  If you have labs (blood work) drawn today and your tests are completely normal, you will receive your results only by: Marland Kitchen MyChart Message (if you have MyChart) OR . A paper copy in the mail If you have any lab test that is abnormal or we need to change your treatment, we will call you to review the results.  Testing/Procedures: None ordered.  Follow-Up: At [facility], you and your health needs are our priority.  As part of our continuing mission to provide you with exceptional heart care, we have created designated Provider Care Teams.  These Care Teams include your primary Cardiologist (physician) and Advanced Practice Providers (APPs -  Physician Assistants and Nurse Practitioners) who all work together to provide you with the care you need, when you need it.  We recommend signing up for the patient portal called "MyChart".  Sign up information is provided on this After Visit Summary.  MyChart is used to connect with patients for Virtual Visits (Telemedicine).  Patients are able to view lab/test results, encounter notes, upcoming appointments, etc.  Non-urgent messages can be sent to your provider as well.   To learn more about what you can do with MyChart, go to [email or web address].    Your next appointment:   Your physician wants you to follow-up in: 1 year with Luke Jimenez. You will receive a reminder letter in the mail two months in advance. If you don't receive a letter, please call our office to schedule the follow-up appointment.  Remote monitoring is used to monitor your Pacemaker from home. This monitoring reduces the number of office visits required to check your  device to one time per year. It allows [place] to keep an eye on the functioning of your device to ensure it is working properly. You are scheduled for a device check from home on 06/23/20. You may send your transmission at any time that day. If you have a wireless device, the transmission will be sent automatically. After your physician reviews your transmission, you will receive a postcard with your next transmission date.  Other Instructions:

## 2020-06-23 ENCOUNTER — Ambulatory Visit (INDEPENDENT_AMBULATORY_CARE_PROVIDER_SITE_OTHER): Payer: No Typology Code available for payment source

## 2020-06-23 DIAGNOSIS — I442 Atrioventricular block, complete: Secondary | ICD-10-CM

## 2020-06-25 LAB — CUP PACEART REMOTE DEVICE CHECK
Battery Remaining Longevity: 108 mo
Battery Voltage: 3.05 V
Brady Statistic AP VP Percent: 0.11 %
Brady Statistic AP VS Percent: 0.01 %
Brady Statistic AS VP Percent: 99.04 %
Brady Statistic AS VS Percent: 0.84 %
Brady Statistic RA Percent Paced: 0.12 %
Brady Statistic RV Percent Paced: 99.15 %
Date Time Interrogation Session: 20220207033742
Implantable Lead Implant Date: 20210805
Implantable Lead Implant Date: 20210805
Implantable Lead Location: 753859
Implantable Lead Location: 753860
Implantable Lead Model: 3830
Implantable Lead Model: 5076
Implantable Pulse Generator Implant Date: 20210805
Lead Channel Impedance Value: 380 Ohm
Lead Channel Impedance Value: 380 Ohm
Lead Channel Impedance Value: 494 Ohm
Lead Channel Impedance Value: 627 Ohm
Lead Channel Pacing Threshold Amplitude: 0.625 V
Lead Channel Pacing Threshold Amplitude: 1.375 V
Lead Channel Pacing Threshold Pulse Width: 0.4 ms
Lead Channel Pacing Threshold Pulse Width: 0.4 ms
Lead Channel Sensing Intrinsic Amplitude: 3.5 mV
Lead Channel Sensing Intrinsic Amplitude: 3.5 mV
Lead Channel Sensing Intrinsic Amplitude: 8.625 mV
Lead Channel Sensing Intrinsic Amplitude: 8.625 mV
Lead Channel Setting Pacing Amplitude: 1.5 V
Lead Channel Setting Pacing Amplitude: 3.25 V
Lead Channel Setting Pacing Pulse Width: 0.4 ms
Lead Channel Setting Sensing Sensitivity: 0.9 mV

## 2020-06-30 NOTE — Progress Notes (Signed)
Remote pacemaker transmission.   

## 2020-09-22 ENCOUNTER — Ambulatory Visit (INDEPENDENT_AMBULATORY_CARE_PROVIDER_SITE_OTHER): Payer: No Typology Code available for payment source

## 2020-09-22 DIAGNOSIS — I442 Atrioventricular block, complete: Secondary | ICD-10-CM

## 2020-09-23 LAB — CUP PACEART REMOTE DEVICE CHECK
Battery Remaining Longevity: 114 mo
Battery Voltage: 3.01 V
Brady Statistic AP VP Percent: 0.2 %
Brady Statistic AP VS Percent: 0.01 %
Brady Statistic AS VP Percent: 98.02 %
Brady Statistic AS VS Percent: 1.77 %
Brady Statistic RA Percent Paced: 0.22 %
Brady Statistic RV Percent Paced: 98.22 %
Date Time Interrogation Session: 20220509001518
Implantable Lead Implant Date: 20210805
Implantable Lead Implant Date: 20210805
Implantable Lead Location: 753859
Implantable Lead Location: 753860
Implantable Lead Model: 3830
Implantable Lead Model: 5076
Implantable Pulse Generator Implant Date: 20210805
Lead Channel Impedance Value: 361 Ohm
Lead Channel Impedance Value: 361 Ohm
Lead Channel Impedance Value: 475 Ohm
Lead Channel Impedance Value: 475 Ohm
Lead Channel Pacing Threshold Amplitude: 0.5 V
Lead Channel Pacing Threshold Amplitude: 1.375 V
Lead Channel Pacing Threshold Pulse Width: 0.4 ms
Lead Channel Pacing Threshold Pulse Width: 0.4 ms
Lead Channel Sensing Intrinsic Amplitude: 4 mV
Lead Channel Sensing Intrinsic Amplitude: 4 mV
Lead Channel Sensing Intrinsic Amplitude: 8.25 mV
Lead Channel Sensing Intrinsic Amplitude: 8.25 mV
Lead Channel Setting Pacing Amplitude: 1.5 V
Lead Channel Setting Pacing Amplitude: 2.75 V
Lead Channel Setting Pacing Pulse Width: 0.4 ms
Lead Channel Setting Sensing Sensitivity: 0.9 mV

## 2020-10-14 NOTE — Progress Notes (Signed)
Remote pacemaker transmission.   

## 2020-12-22 ENCOUNTER — Ambulatory Visit (INDEPENDENT_AMBULATORY_CARE_PROVIDER_SITE_OTHER): Payer: No Typology Code available for payment source

## 2020-12-22 DIAGNOSIS — I442 Atrioventricular block, complete: Secondary | ICD-10-CM | POA: Diagnosis not present

## 2020-12-23 LAB — CUP PACEART REMOTE DEVICE CHECK
Battery Remaining Longevity: 121 mo
Battery Voltage: 3.01 V
Brady Statistic AP VP Percent: 0.17 %
Brady Statistic AP VS Percent: 0.01 %
Brady Statistic AS VP Percent: 99.02 %
Brady Statistic AS VS Percent: 0.81 %
Brady Statistic RA Percent Paced: 0.18 %
Brady Statistic RV Percent Paced: 99.18 %
Date Time Interrogation Session: 20220807234342
Implantable Lead Implant Date: 20210805
Implantable Lead Implant Date: 20210805
Implantable Lead Location: 753859
Implantable Lead Location: 753860
Implantable Lead Model: 3830
Implantable Lead Model: 5076
Implantable Pulse Generator Implant Date: 20210805
Lead Channel Impedance Value: 342 Ohm
Lead Channel Impedance Value: 361 Ohm
Lead Channel Impedance Value: 475 Ohm
Lead Channel Impedance Value: 627 Ohm
Lead Channel Pacing Threshold Amplitude: 0.75 V
Lead Channel Pacing Threshold Amplitude: 1.25 V
Lead Channel Pacing Threshold Pulse Width: 0.4 ms
Lead Channel Pacing Threshold Pulse Width: 0.4 ms
Lead Channel Sensing Intrinsic Amplitude: 3.75 mV
Lead Channel Sensing Intrinsic Amplitude: 3.75 mV
Lead Channel Sensing Intrinsic Amplitude: 8.25 mV
Lead Channel Sensing Intrinsic Amplitude: 8.25 mV
Lead Channel Setting Pacing Amplitude: 1.5 V
Lead Channel Setting Pacing Amplitude: 2.5 V
Lead Channel Setting Pacing Pulse Width: 0.4 ms
Lead Channel Setting Sensing Sensitivity: 0.9 mV

## 2021-01-15 NOTE — Progress Notes (Signed)
Remote pacemaker transmission.   

## 2021-03-23 ENCOUNTER — Ambulatory Visit (INDEPENDENT_AMBULATORY_CARE_PROVIDER_SITE_OTHER): Payer: No Typology Code available for payment source

## 2021-03-23 ENCOUNTER — Ambulatory Visit: Payer: No Typology Code available for payment source | Admitting: Cardiology

## 2021-03-23 ENCOUNTER — Other Ambulatory Visit: Payer: Self-pay

## 2021-03-23 ENCOUNTER — Encounter: Payer: Self-pay | Admitting: Cardiology

## 2021-03-23 VITALS — BP 120/80 | HR 57 | Ht 70.0 in | Wt 149.0 lb

## 2021-03-23 DIAGNOSIS — I442 Atrioventricular block, complete: Secondary | ICD-10-CM

## 2021-03-23 DIAGNOSIS — Z950 Presence of cardiac pacemaker: Secondary | ICD-10-CM

## 2021-03-23 LAB — CUP PACEART REMOTE DEVICE CHECK
Battery Remaining Longevity: 101 mo
Battery Voltage: 3 V
Brady Statistic AP VP Percent: 0.22 %
Brady Statistic AP VS Percent: 0.01 %
Brady Statistic AS VP Percent: 98.74 %
Brady Statistic AS VS Percent: 1.04 %
Brady Statistic RA Percent Paced: 0.23 %
Brady Statistic RV Percent Paced: 98.94 %
Date Time Interrogation Session: 20221106225318
Implantable Lead Implant Date: 20210805
Implantable Lead Implant Date: 20210805
Implantable Lead Location: 753859
Implantable Lead Location: 753860
Implantable Lead Model: 3830
Implantable Lead Model: 5076
Implantable Pulse Generator Implant Date: 20210805
Lead Channel Impedance Value: 342 Ohm
Lead Channel Impedance Value: 361 Ohm
Lead Channel Impedance Value: 456 Ohm
Lead Channel Impedance Value: 589 Ohm
Lead Channel Pacing Threshold Amplitude: 0.75 V
Lead Channel Pacing Threshold Amplitude: 1.5 V
Lead Channel Pacing Threshold Pulse Width: 0.4 ms
Lead Channel Pacing Threshold Pulse Width: 0.4 ms
Lead Channel Sensing Intrinsic Amplitude: 4.5 mV
Lead Channel Sensing Intrinsic Amplitude: 4.5 mV
Lead Channel Sensing Intrinsic Amplitude: 7 mV
Lead Channel Sensing Intrinsic Amplitude: 7 mV
Lead Channel Setting Pacing Amplitude: 1.5 V
Lead Channel Setting Pacing Amplitude: 3 V
Lead Channel Setting Pacing Pulse Width: 0.4 ms
Lead Channel Setting Sensing Sensitivity: 0.9 mV

## 2021-03-23 NOTE — Patient Instructions (Signed)
Medication Instructions:  The current medical regimen is effective;  continue present plan and medications.  *If you need a refill on your cardiac medications before your next appointment, please call your pharmacy*  Follow-Up: At [facility], you and your health needs are our priority.  As part of our continuing mission to provide you with exceptional heart care, we have created designated Provider Care Teams.  These Care Teams include your primary Cardiologist (physician) and Advanced Practice Providers (APPs -  Physician Assistants and Nurse Practitioners) who all work together to provide you with the care you need, when you need it.  We recommend signing up for the patient portal called "MyChart".  Sign up information is provided on this After Visit Summary.  MyChart is used to connect with patients for Virtual Visits (Telemedicine).  Patients are able to view lab/test results, encounter notes, upcoming appointments, etc.  Non-urgent messages can be sent to your provider as well.   To learn more about what you can do with MyChart, go to [email or web address].    Your next appointment:   1 year(s)  The format for your next appointment:   In Person  Provider:   Mark Skains, MD   Thank you for choosing [facility]!!    

## 2021-03-23 NOTE — Progress Notes (Signed)
Cardiology Office Note:    Date:  03/23/2021   ID:  Luke Jimenez, DOB Apr 02, 1966, MRN [id number]  PCP:  Shon Hale, MD  Cardiologist:  Donato Schultz, MD  Electrophysiologist:  None   Referring MD: Shon Hale, *    History of Present Illness:    Luke Jimenez is a 55 y.o. male here for the follow-up of bradycardia and second degree AV block status post pacemaker placement in 2021 by Dr. Johney Frame after symptomatic complete heart block noted.   At his last visit, EKG demonstrated second-degree AV block heart rate 46 bpm with 2-1 AV nodal conduction.  Secondary P wave was buried within the end of T wave.  When going up hill more SOB, recover quickly. Changed a lot since then.    He had a pacemaker implanted by Dr. Johney Frame on 12/20/19.  Today, he has been doing well. He is recovering well after the pacemaker implantation.  He reports becoming short of breath only when going uphill. He went on a 10 mile hike and was at 2000-3000 ft elevation. He was doing well on flat and downgrade slopes. However, he had to stop to catch his breath on the inclines.  He denies any palpitations or chest pain. No lightheadedness, headaches, syncope, orthopnea, PND or lower extremity edema.   Past Medical History:  Diagnosis Date   Bradycardia    Second degree AV block     Past Surgical History:  Procedure Laterality Date   PACEMAKER IMPLANT N/A 12/20/2019   Procedure: PACEMAKER IMPLANT;  Surgeon: Hillis Range, MD;  Location: [facility];  Service: Cardiovascular;  Laterality: N/A;    Current Medications: Current Meds  Medication Sig   cetirizine (ZYRTEC) 10 MG tablet Take 10 mg by mouth daily.   chlorpheniramine (CHLOR-TRIMETON) 4 MG tablet Take 4 mg by mouth daily.   fluticasone (FLONASE) 50 MCG/ACT nasal spray Place 1 spray into both nostrils daily as needed for allergies or rhinitis.     Allergies:   Patient has no known allergies.   Social History   Socioeconomic  History   Marital status: Married    Spouse name: Not on file   Number of children: Not on file   Years of education: Not on file   Highest education level: Not on file  Occupational History   Not on file  Tobacco Use   Smoking status: Never   Smokeless tobacco: Never  Vaping Use   Vaping Use: Never used  Substance and Sexual Activity   Alcohol use: Yes    Comment: 1 drink a week   Drug use: Never   Sexual activity: Not on file  Other Topics Concern   Not on file  Social History Narrative   Not on file   Social Determinants of Health   Financial Resource Strain: Not on file  Food Insecurity: Not on file  Transportation Needs: Not on file  Physical Activity: Not on file  Stress: Not on file  Social Connections: Not on file     Family History: The patient's family history includes Heart disease in his maternal grandfather.  ROS:   Please see the history of present illness.  (+) Dyspnea on exertion   all other systems reviewed and are negative.  EKGs/Labs/Other Studies Reviewed:    The following studies were reviewed today: Prior exercise treadmill test office notes consultation with EP Dr. Johney Frame lab work reviewed  Monitor 10/05/19 Occasional high degree AV block (several P waves with non conduced  QRS) Majority occur at night - question sleep apnea. However 2:1 AVB was noted in clinic. Max heart rate 75 bpm (during activity), min 21 bpm, avg 44 Longest pause 3.7 seconds (14 total events 3.0-3.7 seconds).  Echo 09/06/19 1. Left ventricular ejection fraction, by estimation, is 55 to 60%. The  left ventricle has normal function. The left ventricle has no regional  wall motion abnormalities. Left ventricular diastolic parameters are  indeterminate.   2. Right ventricular systolic function is normal. The right ventricular  size is normal. There is mildly elevated pulmonary artery systolic  pressure.   3. Left atrial size was mildly dilated.   4. Cannot r/o PFO.    5. ? patient in 2:1 AV block so MR is diastolic at times. The mitral  valve is normal in structure. Mild mitral valve regurgitation. No evidence  of mitral stenosis.   6. The aortic valve is tricuspid. Aortic valve regurgitation is not  visualized. No aortic stenosis is present.   7. Aortic dilatation noted. There is mild dilatation of the aortic root  measuring 39 mm.   8. The inferior vena cava is normal in size with greater than 50%  respiratory variability, suggesting right atrial pressure of 3 mmHg.  EKG:  EKG was personally reviewed 03/23/21: Sinus rhythm with PVC appears to be 2-1 AV nodal conduction once again, rate 57 bpm, deep T wave inversion in V2 V3.  Mild T wave inversion noted in inferior leads. 08/28/19: 2-1 AV conduction heart rate 46 bpm, P wave buried within T wave  Recent Labs: No results found for requested labs within last 8760 hours.  Recent Lipid Panel No results found for: CHOL, TRIG, HDL, CHOLHDL, VLDL, LDLCALC, LDLDIRECT  Physical Exam:    VS:  BP 120/80 (BP Location: Left Arm, Patient Position: Sitting, Cuff Size: Normal)   Pulse (!) 57   Ht 5\' 10"  (1.778 m)   Wt 149 lb (67.6 kg)   SpO2 95%   BMI 21.38 kg/m     Wt Readings from Last 3 Encounters:  03/23/21 149 lb (67.6 kg)  03/27/20 151 lb 9.6 oz (68.8 kg)  12/20/19 150 lb (68 kg)     GEN:  Well nourished, well developed in no acute distress HEENT: Normal NECK: No JVD; No carotid bruits LYMPHATICS: No lymphadenopathy CARDIAC: RRR, no rubs, gallops  RESPIRATORY:  Clear to auscultation without rales, wheezing or rhonchi  ABDOMEN: Soft, non-tender, non-distended MUSCULOSKELETAL:  No edema; No deformity  SKIN: Warm and dry NEUROLOGIC:  Alert and oriented x 3 PSYCHIATRIC:  Normal affect   ASSESSMENT:    1. Heart block AV complete (HCC)   2. Cardiac pacemaker     PLAN:   No problem-specific Assessment & Plan notes found for this encounter.  In order of problems listed above:  Second-degree  heart block -2-1 AV nodal conduction.  He has been this way for years.  In 2012 saw Dr. 2013.  Similar symptoms back then.  Hills during activity would be troublesome.  They have however 10 years later become even harder exertional effort.  His wife and he enjoys hiking and she usually has better stamina going uphill than he does.  He recovers quite quickly however when he needs to stop.  He has not had any dangerous or adverse syncopal episodes.  No chest discomfort with this.  Prior exercise treadmill test showed continuation of 2-1 AV block over 10 to 12 minutes of exercise.  Exercise treadmill reviewed from 2012.  Holter  monitor showed no evidence of worsening heart block previously. -I will go ahead and check an echocardiogram to ensure continued proper structure and function of his heart.  Prior echocardiogram showed EF of 70%. -I will also check a Zio patch monitor to ensure that there are no worsening conduction difficulties.  I have asked him to exercise with this patch monitor to see what his peak heart rate is. -Ultimately, we may have him revisit with Dr. Johney Frame.  I explained to him that a pacemaker may be warranted at some point but we have to figure out what that inflection point is.  Nevertheless, we will have a year follow-up.  I will follow up with monitor and echo.   Medication Adjustments/Labs and Tests Ordered: Current medicines are reviewed at length with the patient today.  Concerns regarding medicines are outlined above.  Orders Placed This Encounter  Procedures   EKG 12-Lead    No orders of the defined types were placed in this encounter.   Patient Instructions  Medication Instructions:  The current medical regimen is effective;  continue present plan and medications.  *If you need a refill on your cardiac medications before your next appointment, please call your pharmacy*  Follow-Up: At [facility], you and your health needs are our priority.  As part of our  continuing mission to provide you with exceptional heart care, we have created designated Provider Care Teams.  These Care Teams include your primary Cardiologist (physician) and Advanced Practice Providers (APPs -  Physician Assistants and Nurse Practitioners) who all work together to provide you with the care you need, when you need it.  We recommend signing up for the patient portal called "MyChart".  Sign up information is provided on this After Visit Summary.  MyChart is used to connect with patients for Virtual Visits (Telemedicine).  Patients are able to view lab/test results, encounter notes, upcoming appointments, etc.  Non-urgent messages can be sent to your provider as well.   To learn more about what you can do with MyChart, go to [email or web address].    Your next appointment:   1 year(s)  The format for your next appointment:   In Person  Provider:   Donato Schultz, MD     Thank you for choosing [facility]!!     Sherin Quarry as a scribe for Donato Schultz, MD.,have documented all relevant documentation on the behalf of Donato Schultz, MD,as directed by  Donato Schultz, MD while in the presence of Donato Schultz, MD.  I, Donato Schultz, MD, have reviewed all documentation for this visit. The documentation on 03/23/21 for the exam, diagnosis, procedures, and orders are all accurate and complete.   Signed, Donato Schultz, MD  03/23/2021 1:41 PM    [facility]

## 2021-03-31 NOTE — Progress Notes (Signed)
Remote pacemaker transmission.   

## 2021-04-03 NOTE — Progress Notes (Signed)
Electrophysiology Office Note Date: 04/06/2021  ID:  Tamara Kenyon, DOB December 10, 1965, MRN [id number]  PCP: Shon Hale, MD Primary Cardiologist: Donato Schultz, MD Electrophysiologist: Hillis Range, MD   CC: Pacemaker follow-up  Luke Jimenez is a 55 y.o. male seen today for Hillis Range, MD for routine electrophysiology followup.  Since last being seen in our clinic the patient reports doing very well.  he denies chest pain, palpitations, dyspnea, PND, orthopnea, nausea, vomiting, dizziness, syncope, edema, weight gain, or early satiety.  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     chlorpheniramine (CHLOR-TRIMETON) 4 MG tablet Take 4 mg by mouth daily.     fluticasone (FLONASE) 50 MCG/ACT nasal spray Place 1 spray into both nostrils daily as needed for allergies or rhinitis.     testosterone cypionate (DEPOTESTOSTERONE CYPIONATE) 200 MG/ML injection once a week.     thyroid (ARMOUR) 60 MG tablet Take 60 mg by mouth daily.     No current facility-administered medications for this visit.    Allergies:   Patient has no known allergies.   Social History: Social History   Socioeconomic History   Marital status: Married    Spouse name: Not on file   Number of children: Not on file   Years of education: Not on file   Highest education level: Not on file  Occupational History   Not on file  Tobacco Use   Smoking status: Never   Smokeless tobacco: Never  Vaping Use   Vaping Use: Never used   Substance and Sexual Activity   Alcohol use: Yes    Comment: 1 drink a week   Drug use: Never   Sexual activity: Not on file  Other Topics Concern   Not on file  Social History Narrative   Not on file   Social Determinants of Health   Financial Resource Strain: Not on file  Food Insecurity: Not on file  Transportation Needs: Not on file  Physical Activity: Not on file  Stress: Not on file  Social Connections: Not on file  Intimate Partner Violence: Not on file    Family History: Family History  Problem Relation Age of Onset   Heart disease Maternal Grandfather      Review of Systems: All other systems reviewed and are otherwise negative except as noted above.  Physical Exam: Vitals:   04/06/21 0807  BP: 118/90  Pulse: 98  SpO2: 100%  Weight: 152 lb 3.2 oz (69 kg)  Height: 5\' 10"  (1.778 m)     GEN- The patient is well appearing, alert and oriented x 3 today.   HEENT: normocephalic, atraumatic; sclera clear, conjunctiva pink; hearing intact; oropharynx clear; neck supple  Lungs- Clear to ausculation bilaterally, normal work of breathing.  No wheezes, rales, rhonchi Heart- Regular rate and rhythm, no murmurs, rubs or gallops  GI- soft, non-tender, non-distended, bowel sounds present  Extremities- no clubbing or cyanosis. No edema MS- no significant deformity or atrophy Skin- warm and dry, no rash or  lesion; PPM pocket well healed Psych- euthymic mood, full affect Neuro- strength and sensation are intact  PPM Interrogation- reviewed in detail today,  See PACEART report  EKG:  EKG is not ordered today. Personal review of ekg ordered  03/23/21  shows sinus brady at 57 bpm   Recent Labs: No results found for requested labs within last 8760 hours.   Wt Readings from Last 3 Encounters:  04/06/21 152 lb 3.2 oz (69 kg)  03/23/21 149 lb (67.6 kg)  03/27/20 151 lb 9.6 oz (68.8 kg)     Other studies Reviewed: Additional studies/ records that were reviewed today  include: Previous EP office notes, Previous remote checks, Most recent labwork.   Assessment and Plan:  1. Symptomatic bradycardia / second degree AV block s/p Medtronic PPM  Normal PPM function See Pace Art report Adjustments to amplitude made to further longevity   Current medicines are reviewed at length with the patient today.    Disposition:   Follow up with EP APP in 12 months    Signed, Graciella Freer, PA-C  04/06/2021 8:30 AM  [facility] [address] [place] [address] [phone] (office) [phone] (fax)

## 2021-04-06 ENCOUNTER — Other Ambulatory Visit: Payer: Self-pay

## 2021-04-06 ENCOUNTER — Ambulatory Visit: Payer: No Typology Code available for payment source | Admitting: Student

## 2021-04-06 ENCOUNTER — Encounter: Payer: Self-pay | Admitting: Student

## 2021-04-06 VITALS — BP 118/90 | HR 98 | Ht 70.0 in | Wt 152.2 lb

## 2021-04-06 DIAGNOSIS — R001 Bradycardia, unspecified: Secondary | ICD-10-CM | POA: Diagnosis not present

## 2021-04-06 DIAGNOSIS — I441 Atrioventricular block, second degree: Secondary | ICD-10-CM

## 2021-04-06 LAB — CUP PACEART INCLINIC DEVICE CHECK
Battery Remaining Longevity: 103 mo
Battery Voltage: 2.99 V
Brady Statistic AP VP Percent: 0.17 %
Brady Statistic AP VS Percent: 0.01 %
Brady Statistic AS VP Percent: 98.7 %
Brady Statistic AS VS Percent: 1.12 %
Brady Statistic RA Percent Paced: 0.18 %
Brady Statistic RV Percent Paced: 98.87 %
Date Time Interrogation Session: 20221121082732
Implantable Lead Implant Date: 20210805
Implantable Lead Implant Date: 20210805
Implantable Lead Location: 753859
Implantable Lead Location: 753860
Implantable Lead Model: 3830
Implantable Lead Model: 5076
Implantable Pulse Generator Implant Date: 20210805
Lead Channel Impedance Value: 342 Ohm
Lead Channel Impedance Value: 380 Ohm
Lead Channel Impedance Value: 475 Ohm
Lead Channel Impedance Value: 570 Ohm
Lead Channel Pacing Threshold Amplitude: 0.625 V
Lead Channel Pacing Threshold Amplitude: 1.625 V
Lead Channel Pacing Threshold Pulse Width: 0.4 ms
Lead Channel Pacing Threshold Pulse Width: 0.4 ms
Lead Channel Sensing Intrinsic Amplitude: 2.5 mV
Lead Channel Sensing Intrinsic Amplitude: 2.625 mV
Lead Channel Sensing Intrinsic Amplitude: 7.125 mV
Lead Channel Sensing Intrinsic Amplitude: 7.875 mV
Lead Channel Setting Pacing Amplitude: 1.5 V
Lead Channel Setting Pacing Amplitude: 2.5 V
Lead Channel Setting Pacing Pulse Width: 0.6 ms
Lead Channel Setting Sensing Sensitivity: 0.9 mV

## 2021-04-06 NOTE — Patient Instructions (Signed)
Medication Instructions:  Your physician recommends that you continue on your current medications as directed. Please refer to the Current Medication list given to you today.  *If you need a refill on your cardiac medications before your next appointment, please call your pharmacy*   Lab Work: None If you have labs (blood work) drawn today and your tests are completely normal, you will receive your results only by: MyChart Message (if you have MyChart) OR A paper copy in the mail If you have any lab test that is abnormal or we need to change your treatment, we will call you to review the results.   Follow-Up: At [facility], you and your health needs are our priority.  As part of our continuing mission to provide you with exceptional heart care, we have created designated Provider Care Teams.  These Care Teams include your primary Cardiologist (physician) and Advanced Practice Providers (APPs -  Physician Assistants and Nurse Practitioners) who all work together to provide you with the care you need, when you need it.   Your next appointment:   1 year(s)  The format for your next appointment:   In Person  Provider:   You may see James Allred, MD or one of the following Advanced Practice Providers on your designated Care Team:   Renee Ursuy, PA-C Michael "Andy" Tillery, PA-C  

## 2021-05-15 IMAGING — CR DG CHEST 2V
2 series · 2 of 2 positions shown · non-contrast
Comparison: None.

CLINICAL DATA: Pacemaker placement

EXAM:
CHEST - 2 VIEW

[w chest pa]
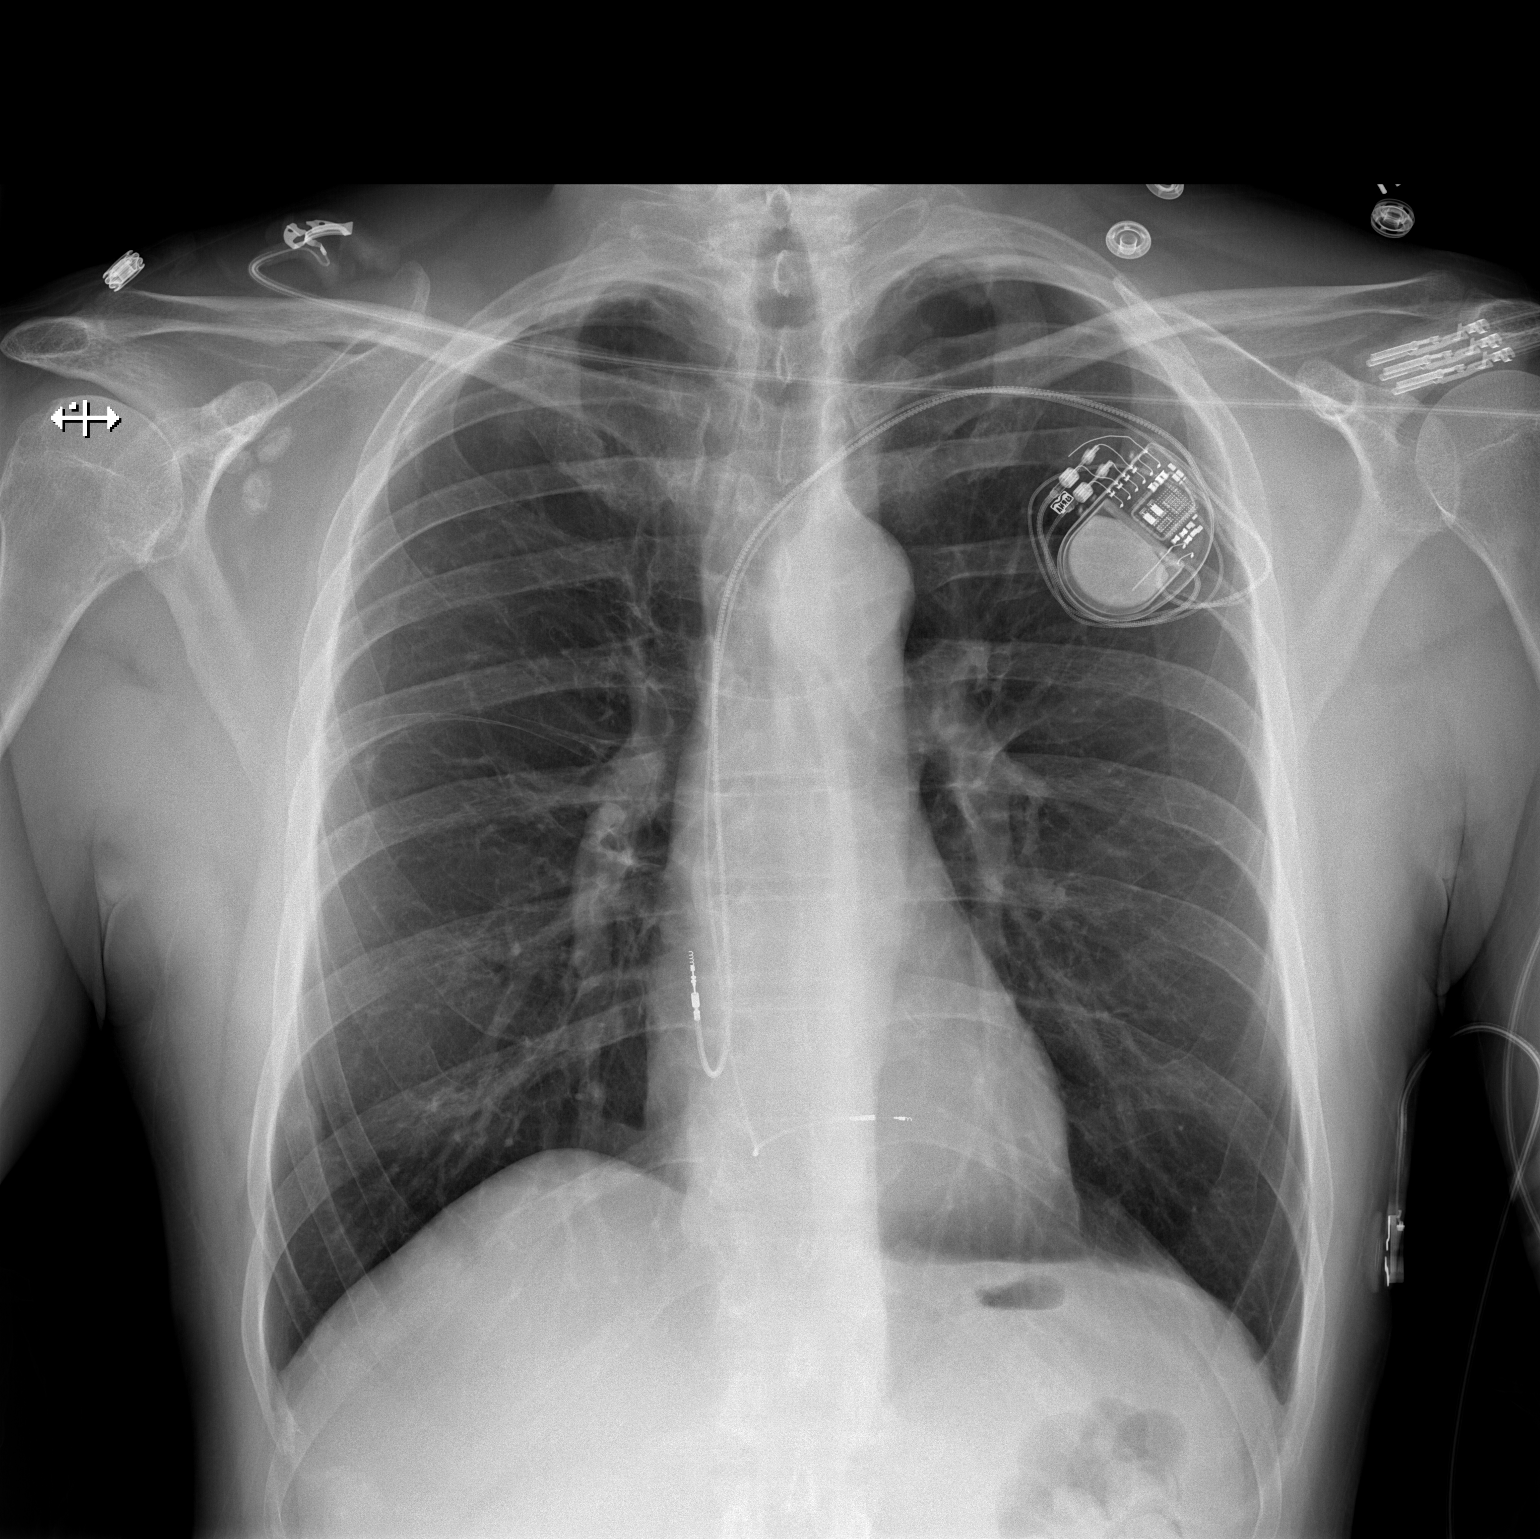

[w chest lat]
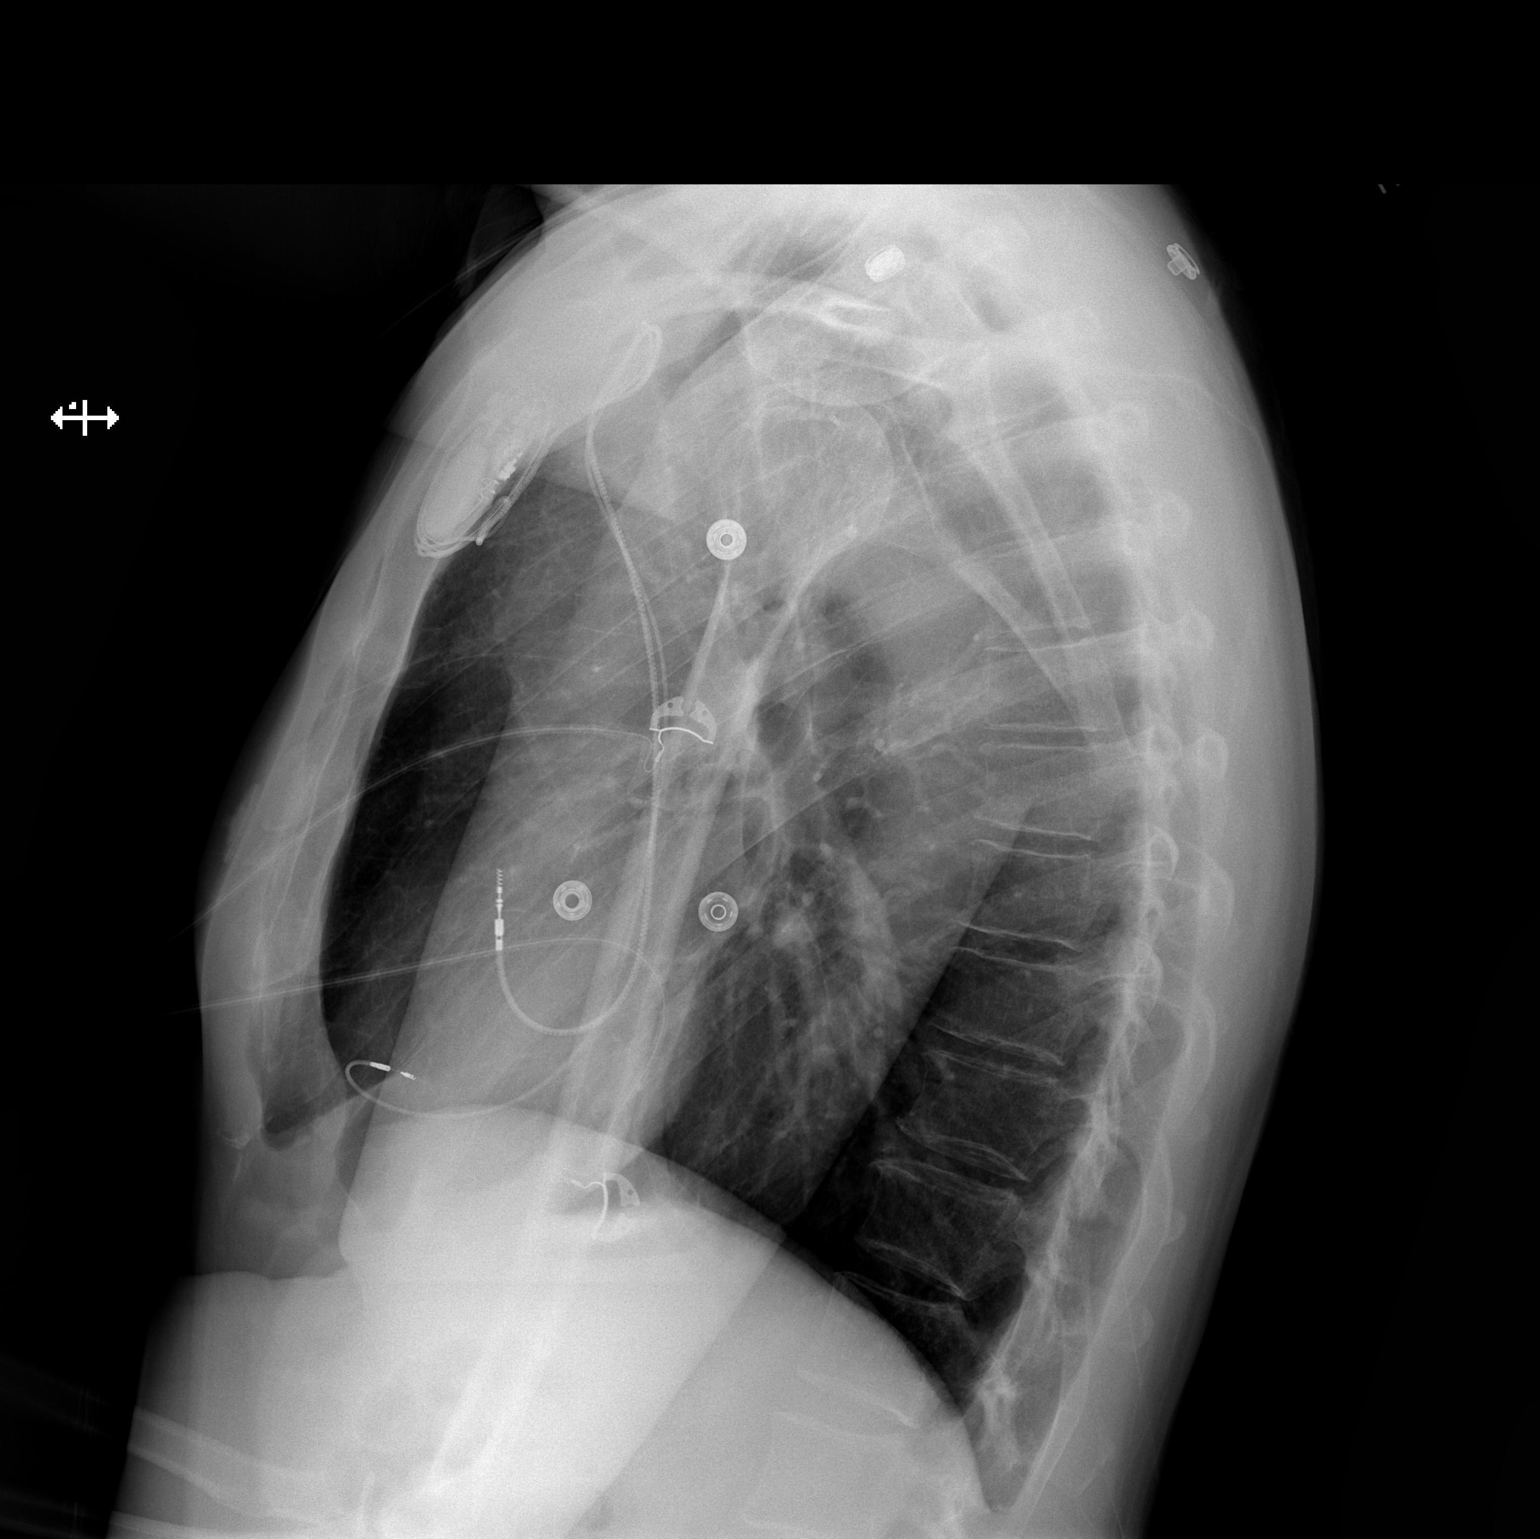

[2 of 2 positions shown; findings below may reference images not displayed]

FINDINGS: Left chest wall dual lead pacemaker. Leads overlie the right atrium
and right ventricle. No pneumothorax. Clear lungs. No pleural
effusion. Normal heart size. Included osseous structures are
unremarkable.
IMPRESSION: Left chest wall pacemaker with leads overlying right atrium and
right ventricle. No pneumothorax.

## 2021-06-22 ENCOUNTER — Ambulatory Visit (INDEPENDENT_AMBULATORY_CARE_PROVIDER_SITE_OTHER): Payer: No Typology Code available for payment source

## 2021-06-22 DIAGNOSIS — I441 Atrioventricular block, second degree: Secondary | ICD-10-CM | POA: Diagnosis not present

## 2021-06-23 LAB — CUP PACEART REMOTE DEVICE CHECK
Battery Remaining Longevity: 115 mo
Battery Voltage: 3 V
Brady Statistic AP VP Percent: 0.1 %
Brady Statistic AP VS Percent: 0.01 %
Brady Statistic AS VP Percent: 98.45 %
Brady Statistic AS VS Percent: 1.45 %
Brady Statistic RA Percent Paced: 0.11 %
Brady Statistic RV Percent Paced: 98.55 %
Date Time Interrogation Session: 20230207133515
Implantable Lead Implant Date: 20210805
Implantable Lead Implant Date: 20210805
Implantable Lead Location: 753859
Implantable Lead Location: 753860
Implantable Lead Model: 3830
Implantable Lead Model: 5076
Implantable Pulse Generator Implant Date: 20210805
Lead Channel Impedance Value: 361 Ohm
Lead Channel Impedance Value: 361 Ohm
Lead Channel Impedance Value: 475 Ohm
Lead Channel Impedance Value: 570 Ohm
Lead Channel Pacing Threshold Amplitude: 0.625 V
Lead Channel Pacing Threshold Amplitude: 1.125 V
Lead Channel Pacing Threshold Pulse Width: 0.4 ms
Lead Channel Pacing Threshold Pulse Width: 0.4 ms
Lead Channel Sensing Intrinsic Amplitude: 3.375 mV
Lead Channel Sensing Intrinsic Amplitude: 3.375 mV
Lead Channel Sensing Intrinsic Amplitude: 7 mV
Lead Channel Sensing Intrinsic Amplitude: 7 mV
Lead Channel Setting Pacing Amplitude: 1.5 V
Lead Channel Setting Pacing Amplitude: 2.5 V
Lead Channel Setting Pacing Pulse Width: 0.4 ms
Lead Channel Setting Sensing Sensitivity: 0.9 mV

## 2021-06-25 NOTE — Progress Notes (Signed)
Remote pacemaker transmission.   

## 2021-09-21 ENCOUNTER — Ambulatory Visit (INDEPENDENT_AMBULATORY_CARE_PROVIDER_SITE_OTHER): Payer: No Typology Code available for payment source

## 2021-09-21 DIAGNOSIS — I441 Atrioventricular block, second degree: Secondary | ICD-10-CM | POA: Diagnosis not present

## 2021-09-22 LAB — CUP PACEART REMOTE DEVICE CHECK
Battery Remaining Longevity: 110 mo
Battery Voltage: 3 V
Brady Statistic AP VP Percent: 0.08 %
Brady Statistic AP VS Percent: 0.01 %
Brady Statistic AS VP Percent: 97.68 %
Brady Statistic AS VS Percent: 2.23 %
Brady Statistic RA Percent Paced: 0.09 %
Brady Statistic RV Percent Paced: 97.77 %
Date Time Interrogation Session: 20230509114004
Implantable Lead Implant Date: 20210805
Implantable Lead Implant Date: 20210805
Implantable Lead Location: 753859
Implantable Lead Location: 753860
Implantable Lead Model: 3830
Implantable Lead Model: 5076
Implantable Pulse Generator Implant Date: 20210805
Lead Channel Impedance Value: 342 Ohm
Lead Channel Impedance Value: 361 Ohm
Lead Channel Impedance Value: 456 Ohm
Lead Channel Impedance Value: 513 Ohm
Lead Channel Pacing Threshold Amplitude: 0.5 V
Lead Channel Pacing Threshold Amplitude: 1 V
Lead Channel Pacing Threshold Pulse Width: 0.4 ms
Lead Channel Pacing Threshold Pulse Width: 0.4 ms
Lead Channel Sensing Intrinsic Amplitude: 2.75 mV
Lead Channel Sensing Intrinsic Amplitude: 2.75 mV
Lead Channel Sensing Intrinsic Amplitude: 8.25 mV
Lead Channel Sensing Intrinsic Amplitude: 8.25 mV
Lead Channel Setting Pacing Amplitude: 1.5 V
Lead Channel Setting Pacing Amplitude: 2.5 V
Lead Channel Setting Pacing Pulse Width: 0.4 ms
Lead Channel Setting Sensing Sensitivity: 0.9 mV

## 2021-10-08 NOTE — Progress Notes (Signed)
Remote pacemaker transmission.   

## 2021-12-21 ENCOUNTER — Ambulatory Visit (INDEPENDENT_AMBULATORY_CARE_PROVIDER_SITE_OTHER): Payer: No Typology Code available for payment source

## 2021-12-21 DIAGNOSIS — I441 Atrioventricular block, second degree: Secondary | ICD-10-CM | POA: Diagnosis not present

## 2021-12-22 LAB — CUP PACEART REMOTE DEVICE CHECK
Battery Remaining Longevity: 110 mo
Battery Voltage: 2.99 V
Brady Statistic AP VP Percent: 0.06 %
Brady Statistic AP VS Percent: 0 %
Brady Statistic AS VP Percent: 98.13 %
Brady Statistic AS VS Percent: 1.81 %
Brady Statistic RA Percent Paced: 0.07 %
Brady Statistic RV Percent Paced: 98.18 %
Date Time Interrogation Session: 20230808114127
Implantable Lead Implant Date: 20210805
Implantable Lead Implant Date: 20210805
Implantable Lead Location: 753859
Implantable Lead Location: 753860
Implantable Lead Model: 3830
Implantable Lead Model: 5076
Implantable Pulse Generator Implant Date: 20210805
Lead Channel Impedance Value: 361 Ohm
Lead Channel Impedance Value: 380 Ohm
Lead Channel Impedance Value: 494 Ohm
Lead Channel Impedance Value: 532 Ohm
Lead Channel Pacing Threshold Amplitude: 0.625 V
Lead Channel Pacing Threshold Amplitude: 1.25 V
Lead Channel Pacing Threshold Pulse Width: 0.4 ms
Lead Channel Pacing Threshold Pulse Width: 0.4 ms
Lead Channel Sensing Intrinsic Amplitude: 3.625 mV
Lead Channel Sensing Intrinsic Amplitude: 3.625 mV
Lead Channel Sensing Intrinsic Amplitude: 8.25 mV
Lead Channel Sensing Intrinsic Amplitude: 8.25 mV
Lead Channel Setting Pacing Amplitude: 1.5 V
Lead Channel Setting Pacing Amplitude: 2.5 V
Lead Channel Setting Pacing Pulse Width: 0.4 ms
Lead Channel Setting Sensing Sensitivity: 0.9 mV

## 2022-01-28 NOTE — Progress Notes (Signed)
Remote pacemaker transmission.   

## 2022-03-22 ENCOUNTER — Ambulatory Visit (INDEPENDENT_AMBULATORY_CARE_PROVIDER_SITE_OTHER): Payer: No Typology Code available for payment source

## 2022-03-22 DIAGNOSIS — I441 Atrioventricular block, second degree: Secondary | ICD-10-CM | POA: Diagnosis not present

## 2022-03-24 LAB — CUP PACEART REMOTE DEVICE CHECK
Battery Remaining Longevity: 109 mo
Battery Voltage: 2.99 V
Brady Statistic AP VP Percent: 0.07 %
Brady Statistic AP VS Percent: 0.01 %
Brady Statistic AS VP Percent: 97.99 %
Brady Statistic AS VS Percent: 1.93 %
Brady Statistic RA Percent Paced: 0.08 %
Brady Statistic RV Percent Paced: 98.06 %
Date Time Interrogation Session: 20231106074951
Implantable Lead Connection Status: 753985
Implantable Lead Connection Status: 753985
Implantable Lead Implant Date: 20210805
Implantable Lead Implant Date: 20210805
Implantable Lead Location: 753859
Implantable Lead Location: 753860
Implantable Lead Model: 3830
Implantable Lead Model: 5076
Implantable Pulse Generator Implant Date: 20210805
Lead Channel Impedance Value: 323 Ohm
Lead Channel Impedance Value: 342 Ohm
Lead Channel Impedance Value: 456 Ohm
Lead Channel Impedance Value: 494 Ohm
Lead Channel Pacing Threshold Amplitude: 0.5 V
Lead Channel Pacing Threshold Amplitude: 1.125 V
Lead Channel Pacing Threshold Pulse Width: 0.4 ms
Lead Channel Pacing Threshold Pulse Width: 0.4 ms
Lead Channel Sensing Intrinsic Amplitude: 2.75 mV
Lead Channel Sensing Intrinsic Amplitude: 2.75 mV
Lead Channel Sensing Intrinsic Amplitude: 7.125 mV
Lead Channel Sensing Intrinsic Amplitude: 7.125 mV
Lead Channel Setting Pacing Amplitude: 1.5 V
Lead Channel Setting Pacing Amplitude: 2.25 V
Lead Channel Setting Pacing Pulse Width: 0.4 ms
Lead Channel Setting Sensing Sensitivity: 0.9 mV
Zone Setting Status: 755011

## 2022-04-28 NOTE — Progress Notes (Signed)
Remote pacemaker transmission.   

## 2022-06-21 ENCOUNTER — Ambulatory Visit: Payer: No Typology Code available for payment source

## 2022-06-21 DIAGNOSIS — I442 Atrioventricular block, complete: Secondary | ICD-10-CM

## 2022-06-22 LAB — CUP PACEART REMOTE DEVICE CHECK
Battery Remaining Longevity: 107 mo
Battery Voltage: 2.99 V
Brady Statistic AP VP Percent: 0.04 %
Brady Statistic AP VS Percent: 0.01 %
Brady Statistic AS VP Percent: 98 %
Brady Statistic AS VS Percent: 1.95 %
Brady Statistic RA Percent Paced: 0.05 %
Brady Statistic RV Percent Paced: 98.05 %
Date Time Interrogation Session: 20240204192808
Implantable Lead Connection Status: 753985
Implantable Lead Connection Status: 753985
Implantable Lead Implant Date: 20210805
Implantable Lead Implant Date: 20210805
Implantable Lead Location: 753859
Implantable Lead Location: 753860
Implantable Lead Model: 3830
Implantable Lead Model: 5076
Implantable Pulse Generator Implant Date: 20210805
Lead Channel Impedance Value: 342 Ohm
Lead Channel Impedance Value: 361 Ohm
Lead Channel Impedance Value: 456 Ohm
Lead Channel Impedance Value: 475 Ohm
Lead Channel Pacing Threshold Amplitude: 0.5 V
Lead Channel Pacing Threshold Amplitude: 1.125 V
Lead Channel Pacing Threshold Pulse Width: 0.4 ms
Lead Channel Pacing Threshold Pulse Width: 0.4 ms
Lead Channel Sensing Intrinsic Amplitude: 3 mV
Lead Channel Sensing Intrinsic Amplitude: 3 mV
Lead Channel Sensing Intrinsic Amplitude: 6.25 mV
Lead Channel Sensing Intrinsic Amplitude: 6.25 mV
Lead Channel Setting Pacing Amplitude: 1.5 V
Lead Channel Setting Pacing Amplitude: 2.25 V
Lead Channel Setting Pacing Pulse Width: 0.4 ms
Lead Channel Setting Sensing Sensitivity: 0.9 mV
Zone Setting Status: 755011

## 2022-08-05 NOTE — Progress Notes (Signed)
Remote pacemaker transmission.   

## 2022-08-09 ENCOUNTER — Inpatient Hospital Stay (HOSPITAL_COMMUNITY)
Admission: EM | Admit: 2022-08-09 | Discharge: 2022-08-13 | DRG: 064 | Disposition: A | Discharge status: Home / Self Care | Payer: No Typology Code available for payment source | Attending: Family Medicine | Admitting: Family Medicine

## 2022-08-09 ENCOUNTER — Emergency Department (HOSPITAL_COMMUNITY): Payer: No Typology Code available for payment source

## 2022-08-09 ENCOUNTER — Other Ambulatory Visit: Payer: Self-pay

## 2022-08-09 ENCOUNTER — Observation Stay (HOSPITAL_COMMUNITY): Payer: No Typology Code available for payment source

## 2022-08-09 DIAGNOSIS — Z8249 Family history of ischemic heart disease and other diseases of the circulatory system: Secondary | ICD-10-CM

## 2022-08-09 DIAGNOSIS — I63432 Cerebral infarction due to embolism of left posterior cerebral artery: Principal | ICD-10-CM | POA: Diagnosis present

## 2022-08-09 DIAGNOSIS — I959 Hypotension, unspecified: Secondary | ICD-10-CM | POA: Diagnosis not present

## 2022-08-09 DIAGNOSIS — R4182 Altered mental status, unspecified: Secondary | ICD-10-CM | POA: Diagnosis not present

## 2022-08-09 DIAGNOSIS — R404 Transient alteration of awareness: Secondary | ICD-10-CM

## 2022-08-09 DIAGNOSIS — I4719 Other supraventricular tachycardia: Secondary | ICD-10-CM | POA: Diagnosis present

## 2022-08-09 DIAGNOSIS — R29701 NIHSS score 1: Secondary | ICD-10-CM | POA: Diagnosis present

## 2022-08-09 DIAGNOSIS — R0981 Nasal congestion: Secondary | ICD-10-CM | POA: Diagnosis present

## 2022-08-09 DIAGNOSIS — I442 Atrioventricular block, complete: Secondary | ICD-10-CM | POA: Diagnosis present

## 2022-08-09 DIAGNOSIS — I472 Ventricular tachycardia, unspecified: Secondary | ICD-10-CM | POA: Diagnosis present

## 2022-08-09 DIAGNOSIS — I340 Nonrheumatic mitral (valve) insufficiency: Secondary | ICD-10-CM | POA: Diagnosis present

## 2022-08-09 DIAGNOSIS — I5022 Chronic systolic (congestive) heart failure: Secondary | ICD-10-CM | POA: Diagnosis present

## 2022-08-09 DIAGNOSIS — Z79899 Other long term (current) drug therapy: Secondary | ICD-10-CM

## 2022-08-09 DIAGNOSIS — E1165 Type 2 diabetes mellitus with hyperglycemia: Secondary | ICD-10-CM | POA: Diagnosis present

## 2022-08-09 DIAGNOSIS — E039 Hypothyroidism, unspecified: Secondary | ICD-10-CM | POA: Diagnosis present

## 2022-08-09 DIAGNOSIS — I639 Cerebral infarction, unspecified: Secondary | ICD-10-CM

## 2022-08-09 DIAGNOSIS — I6310 Cerebral infarction due to embolism of unspecified precerebral artery: Secondary | ICD-10-CM

## 2022-08-09 DIAGNOSIS — R4701 Aphasia: Secondary | ICD-10-CM | POA: Diagnosis present

## 2022-08-09 DIAGNOSIS — Z45018 Encounter for adjustment and management of other part of cardiac pacemaker: Secondary | ICD-10-CM

## 2022-08-09 DIAGNOSIS — I618 Other nontraumatic intracerebral hemorrhage: Secondary | ICD-10-CM | POA: Diagnosis present

## 2022-08-09 DIAGNOSIS — E785 Hyperlipidemia, unspecified: Secondary | ICD-10-CM | POA: Diagnosis present

## 2022-08-09 DIAGNOSIS — R569 Unspecified convulsions: Secondary | ICD-10-CM | POA: Diagnosis present

## 2022-08-09 DIAGNOSIS — R413 Other amnesia: Secondary | ICD-10-CM | POA: Diagnosis present

## 2022-08-09 DIAGNOSIS — I429 Cardiomyopathy, unspecified: Secondary | ICD-10-CM

## 2022-08-09 DIAGNOSIS — I110 Hypertensive heart disease with heart failure: Secondary | ICD-10-CM | POA: Diagnosis present

## 2022-08-09 LAB — COMPREHENSIVE METABOLIC PANEL
ALT: 12 U/L (ref 0–44)
AST: 22 U/L (ref 15–41)
Albumin: 4.2 g/dL (ref 3.5–5.0)
Alkaline Phosphatase: 56 U/L (ref 38–126)
Anion gap: 11 (ref 5–15)
BUN: 14 mg/dL (ref 6–20)
CO2: 24 mmol/L (ref 22–32)
Calcium: 9.2 mg/dL (ref 8.9–10.3)
Chloride: 104 mmol/L (ref 98–111)
Creatinine, Ser: 1.03 mg/dL (ref 0.61–1.24)
GFR, Estimated: >60 mL/min (ref >60)
Glucose, Bld: 114 mg/dL — ABNORMAL HIGH (ref 70–99)
Potassium: 4.3 mmol/L (ref 3.5–5.1)
Sodium: 139 mmol/L (ref 135–145)
Total Bilirubin: 0.3 mg/dL (ref 0.3–1.2)
Total Protein: 6.9 g/dL (ref 6.5–8.1)

## 2022-08-09 LAB — TROPONIN I (HIGH SENSITIVITY)
Troponin I (High Sensitivity): 9 ng/L (ref <18)
Troponin I (High Sensitivity): 11 ng/L (ref <18)

## 2022-08-09 LAB — CBC
HCT: 45.5 % (ref 39.0–52.0)
Hemoglobin: 15.8 g/dL (ref 13.0–17.0)
MCH: 33.7 pg (ref 26.0–34.0)
MCHC: 34.7 g/dL (ref 30.0–36.0)
MCV: 97 fL (ref 80.0–100.0)
Platelets: 285 10*3/uL (ref 150–400)
RBC: 4.69 MIL/uL (ref 4.22–5.81)
RDW: 12.7 % (ref 11.5–15.5)
WBC: 8.7 10*3/uL (ref 4.0–10.5)
nRBC: 0 % (ref 0.0–0.2)

## 2022-08-09 LAB — CBG MONITORING, ED: Glucose-Capillary: 102 mg/dL — ABNORMAL HIGH (ref 70–99)

## 2022-08-09 LAB — TSH: TSH: 0.063 u[IU]/mL — ABNORMAL LOW (ref 0.350–4.500)

## 2022-08-09 LAB — HIV ANTIBODY (ROUTINE TESTING W REFLEX): HIV Screen 4th Generation wRfx: NONREACTIVE

## 2022-08-09 LAB — ETHANOL: Alcohol, Ethyl (B): <10 mg/dL (ref <10)

## 2022-08-09 MED ORDER — ONDANSETRON HCL 4 MG PO TABS: 4.0000 mg | ORAL_TABLET | Freq: Four times a day (QID) | ORAL | Status: DC | PRN

## 2022-08-09 MED ORDER — ONDANSETRON HCL 4 MG/2ML IJ SOLN: 4.0000 mg | Freq: Four times a day (QID) | INTRAMUSCULAR | Status: DC | PRN

## 2022-08-09 NOTE — Progress Notes (Signed)
Per RN, pt is now in room and avail.pt was moved from ED 009c to 3w09

## 2022-08-09 NOTE — ED Notes (Signed)
MRI notified this nurse that scan will be done tomorrow due to patients pacemaker and no one available until tomorrow. MD notified

## 2022-08-09 NOTE — ED Triage Notes (Signed)
Patient here because he "he feels off". Denies vision changes, denies headache, no facial droop, no dysarthria, no arm or leg drift, no sensation differences right versus left in face, arms, nor legs. Patient is alert and in no apparent distress at this time. Patient has difficulty recalling his own date of birth.

## 2022-08-09 NOTE — Progress Notes (Signed)
Patient arrived from ED via stretcher; oriented to room and unit routine; MD notified of arrival to the room and obtained a diet order.

## 2022-08-09 NOTE — ED Provider Triage Note (Signed)
Emergency Medicine Provider Triage Evaluation Note  Luke Jimenez , a 57 y.o. male  was evaluated in triage.  Pt complains of AMS. Last known normal at 9 PM last night.  Pt notes that he feels off and "foggy" in his head. Pt unable to discuss with [place] what feels off about her. Denies chest pain, shortness of breath, abdominal pain, nausea, vomiting, numbness, tingling, weakness.  Review of Systems  Positive:  Negative:   Physical Exam  There were no vitals taken for this visit. Gen:   Awake, no distress   Resp:  Normal effort  MSK:   Moves extremities without difficulty  Other:  Pt with difficulty recalling his full birth date. Negative pronator drift. Grip strength 5/5 bilaterally.  Strength and sensation intact to BUE and BLE. Able to ambulate without assistance or difficulty.   Medical Decision Making  Medically screening exam initiated at 2:46 PM.  Appropriate orders placed.  Kadeen Montaque was informed that the remainder of the evaluation will be completed by another provider, this initial triage assessment does not replace that evaluation, and the importance of remaining in the ED until their evaluation is complete.  2:01 PM - Discussed with RN that patient is in need of a room immediately. RN aware and working on room placement.    Davone Shinault A, PA-C 08/09/22 1500

## 2022-08-09 NOTE — ED Provider Notes (Signed)
[facility] Provider Note   CSN: [id number] Arrival date & time: 08/09/22  1344     History  Chief Complaint  Patient presents with   Altered Mental Status    Luke Jimenez is a 57 y.o. male.  He is brought in by family member for evaluation of possible altered mental status.  Patient states he feels fine although does feel kind of tired and feels his thought process may be a little slower.  He feels this has been going on since he woke up and he felt completely fine going to bed last night.  He has never had this before.  He denies any specific illness or complaints.  No medication changes.  Denies any alcohol or drugs.  States baby has been drinking a little less fluid but otherwise cannot think of any specific aggravating factor.  Daughter-in-law who is with him states he just seems to be a little more slow to answer questions and is not as clear in his thinking.  Patient does endorse a mild headache.  The history is provided by the patient and a relative.  Altered Mental Status Presenting symptoms comment:  Slowed thinking Most recent episode:  Today Episode history:  Continuous Duration:  6 hours Timing:  Constant Progression:  Unchanged Chronicity:  New Context: not alcohol use, not drug use, not head injury, not recent change in medication, not recent illness and not recent infection   Associated symptoms: headaches   Associated symptoms: no abdominal pain, no difficulty breathing, no fever, no nausea, no palpitations, no seizures, no slurred speech, no visual change, no vomiting and no weakness        Home Medications Prior to Admission medications   Medication Sig Start Date End Date Taking? Authorizing Provider  cetirizine (ZYRTEC) 10 MG tablet Take 10 mg by mouth daily.    [provider]  chlorpheniramine (CHLOR-TRIMETON) 4 MG tablet Take 4 mg by mouth daily.    [provider]  fluticasone (FLONASE) 50  MCG/ACT nasal spray Place 1 spray into both nostrils daily as needed for allergies or rhinitis.    [provider]  testosterone cypionate (DEPOTESTOSTERONE CYPIONATE) 200 MG/ML injection once a week. 01/29/21   [provider]  thyroid (ARMOUR) 60 MG tablet Take 60 mg by mouth daily. 01/29/21   [provider]      Allergies    Patient has no known allergies.    Review of Systems   Review of Systems  Constitutional:  Negative for fever.  Eyes:  Negative for visual disturbance.  Respiratory:  Negative for shortness of breath.   Cardiovascular:  Negative for chest pain and palpitations.  Gastrointestinal:  Negative for abdominal pain, nausea and vomiting.  Neurological:  Positive for headaches. Negative for seizures and weakness.    Physical Exam Updated Vital Signs BP 132/85   Pulse (!) 116   Temp 98.8 F (37.1 C)   Resp 16   SpO2 99%  Physical Exam Vitals and nursing note reviewed.  Constitutional:      General: He is not in acute distress.    Appearance: Normal appearance. He is well-developed.  HENT:     Head: Normocephalic and atraumatic.  Eyes:     Conjunctiva/sclera: Conjunctivae normal.  Cardiovascular:     Rate and Rhythm: Normal rate and regular rhythm.     Heart sounds: No murmur heard. Pulmonary:     Effort: Pulmonary effort is normal. No respiratory distress.  Breath sounds: Normal breath sounds.  Abdominal:     Palpations: Abdomen is soft.     Tenderness: There is no abdominal tenderness. There is no guarding or rebound.  Musculoskeletal:        General: No deformity. Normal range of motion.     Cervical back: Neck supple.  Skin:    General: Skin is warm and dry.     Capillary Refill: Capillary refill takes less than 2 seconds.  Neurological:     General: No focal deficit present.     Mental Status: He is alert.     Cranial Nerves: No cranial nerve deficit.     Sensory: No sensory deficit.     Motor: No weakness.      Gait: Gait normal.     ED Results / Procedures / Treatments   Labs (all labs ordered are listed, but only abnormal results are displayed) Labs Reviewed  COMPREHENSIVE METABOLIC PANEL - Abnormal; Notable for the following components:      Result Value   Glucose, Bld 114 (*)    All other components within normal limits  CBG MONITORING, ED - Abnormal; Notable for the following components:   Glucose-Capillary 102 (*)    All other components within normal limits  CBC  ETHANOL  URINALYSIS, ROUTINE W REFLEX MICROSCOPIC  RAPID URINE DRUG SCREEN, HOSP PERFORMED  TSH  TROPONIN I (HIGH SENSITIVITY)  TROPONIN I (HIGH SENSITIVITY)    EKG EKG Interpretation  Date/Time:  Monday August 09 2022 13:37:57 EDT Ventricular Rate:  125 PR Interval:  168 QRS Duration: 164 QT Interval:  342 QTC Calculation: 493 R Axis:   44 Text Interpretation: Atrial-sensed ventricular-paced rhythm Abnormal ECG When compared with ECG of 20-Dec-2019 11:21, increased rate and voltage, increased ST and T changes from prior 8/21 Confirmed by Aletta Edouard [phone]) on 08/09/2022 2:31:37 PM  Radiology CT Head Wo Contrast  Result Date: 08/09/2022 CLINICAL DATA:  Altered mental status EXAM: CT HEAD WITHOUT CONTRAST TECHNIQUE: Contiguous axial images were obtained from the base of the skull through the vertex without intravenous contrast. RADIATION DOSE REDUCTION: This exam was performed according to the departmental dose-optimization program which includes automated exposure control, adjustment of the mA and/or kV according to patient size and/or use of iterative reconstruction technique. COMPARISON:  None Available. FINDINGS: Brain: There is asymmetric hypodensity along the medial aspect of the left temporal lobe (series 2, image 12), which may be artifactual, but is suspicious for an acute infarct. Recommend further evaluation with brain MRI no evidence of hemorrhage, hydrocephalus, extra-axial collection or mass lesion/mass  effect. Vascular: No disproportionately hyperdense vessel or unexpected calcification. Skull: Normal. Negative for fracture or focal lesion. Sinuses/Orbits: No middle ear or mastoid effusion. Mucosal thickening left maxillary sinus. Orbits are unremarkable Other: None. IMPRESSION: Asymmetric hypodensity and loss of gray/white differentiation along the medial aspect of the left temporal lobe may be artifactual, but is suspicious for an acute infarct. Recommend further evaluation with brain MRI. Electronically Signed   By: Marin Roberts M.D.   On: 08/09/2022 15:11    Procedures Procedures    Medications Ordered in ED Medications - No data to display  ED Course/ Medical Decision Making/ A&P Clinical Course as of 08/09/22 1748  Mon Aug 09, 2022  1557 Patient was seen by neurology Dr. Luevenia Maxin.  He agrees [facility] with contrast, EEG.  Neurology will follow along with patient. [MB]  1622 Discussed with [facility] hospitalist  who will evaluate patient for admission. [MB]    Clinical Course User Index [MB] Hayden Rasmussen, MD                             Medical Decision Making Amount and/or Complexity of Data Reviewed Radiology: ordered.  Risk Decision regarding hospitalization.   This patient complains of slowed cognition and possible word finding difficulties; this involves an extensive number of treatment Options and is a complaint that carries with it a high risk of complications and morbidity. The differential includes stroke, bleed, mass, seizure, hypoglycemia, intoxication  I ordered, reviewed and interpreted labs, which included CBC normal chemistries normal other than mildly elevated glucose, alcohol negative, troponin normal I ordered imaging studies which included CT head and MRI brain and I independently    visualized and interpreted imaging which showed suspicious for temporal lesion.  MRI pending. Additional history  obtained from patient's daughter-in-law Previous records obtained and reviewed in epic no recent admissions I consulted neurology Dr. Luevenia Maxin and [facility] hospitalist Dr. Darrick Meigs and discussed lab and imaging findings and discussed disposition.  Cardiac monitoring reviewed, paced rhythm Social determinants considered, no significant barriers Critical Interventions: None  After the interventions stated above, I reevaluated the patient and found patient in no distress and grossly neuro intact Admission and further testing considered, [facility] including [facility].  MRI unable to be done emergently due to his pacemaker.  Neurology also planning on getting an EEG.         Final Clinical Impression(s) / ED Diagnoses Final diagnoses:  Altered mental status, unspecified altered mental status type    Rx / DC Orders ED Discharge Orders     None         Hayden Rasmussen, MD 08/09/22 1752

## 2022-08-09 NOTE — ED Notes (Signed)
ED TO INPATIENT HANDOFF REPORT  ED Nurse Name and Phone #: Newman Pies [phone]  S Name/Age/Gender Luke Jimenez 57 y.o. male Room/Bed: 040C/040C  Code Status   Code Status: Full Code  Home/SNF/Other Home Patient oriented to: self, place, time, and situation Is this baseline? Yes   Triage Complete: Triage complete  Chief Complaint AMS (altered mental status) [R41.82]  Triage Note Patient here because he "he feels off". Denies vision changes, denies headache, no facial droop, no dysarthria, no arm or leg drift, no sensation differences right versus left in face, arms, nor legs. Patient is alert and in no apparent distress at this time. Patient has difficulty recalling his own date of birth.   Allergies No Known Allergies  Level of Care/Admitting Diagnosis ED Disposition     ED Disposition  Admit   Condition  --   Comment  Hospital Area: [place] [[id number]]  Level of Care: Telemetry Medical [104]  May place patient in observation at [facility] or [place] if equivalent level of care is available:: No  Covid Evaluation: Symptomatic Person Under Investigation (PUI) or recent exposure (last 10 days) *Testing Required*  Diagnosis: AMS (altered mental status) [id number]  Admitting Physician: LAMA, [place]  Attending Physician: [name] [[id number]]          B Medical/Surgery History Past Medical History:  Diagnosis Date   Bradycardia    Second degree AV block    Past Surgical History:  Procedure Laterality Date   PACEMAKER IMPLANT N/A 12/20/2019   Procedure: PACEMAKER IMPLANT;  Surgeon: Thompson Grayer, MD;  Location: [place];  Service: Cardiovascular;  Laterality: N/A;     A IV Location/Drains/Wounds Patient Lines/Drains/Airways Status     Active Line/Drains/Airways     Name Placement date Placement time Site Days   Peripheral IV 08/09/22 20 G Anterior;Distal;Right;Upper Arm 08/09/22  1618  Arm  less than 1             Intake/Output Last 24 hours No intake or output data in the 24 hours ending 08/09/22 1734  Labs/Imaging Results for orders placed or performed during the hospital encounter of 08/09/22 (from the past 48 hour(s))  Comprehensive metabolic panel     Status: Abnormal   Collection Time: 08/09/22  2:07 PM  Result Value Ref Range   Sodium 139 135 - 145 mmol/L   Potassium 4.3 3.5 - 5.1 mmol/L   Chloride 104 98 - 111 mmol/L   CO2 24 22 - 32 mmol/L   Glucose, Bld 114 (H) 70 - 99 mg/dL    Comment: Glucose reference range applies only to samples taken after fasting for at least 8 hours.   BUN 14 6 - 20 mg/dL   Creatinine, Ser 1.03 0.61 - 1.24 mg/dL   Calcium 9.2 8.9 - 10.3 mg/dL   Total Protein 6.9 6.5 - 8.1 g/dL   Albumin 4.2 3.5 - 5.0 g/dL   AST 22 15 - 41 U/L   ALT 12 0 - 44 U/L   Alkaline Phosphatase 56 38 - 126 U/L   Total Bilirubin 0.3 0.3 - 1.2 mg/dL   GFR, Estimated >60 >60 mL/min    Comment: (NOTE) Calculated using the CKD-EPI Creatinine Equation (2021)    Anion gap 11 5 - 15    Comment: Performed at [facility] [address], [place], [place] [address]  CBC     Status: None   Collection Time: 08/09/22  2:07 PM  Result Value Ref Range   WBC 8.7 4.0 - 10.5 K/uL   RBC 4.69 4.22 - 5.81 MIL/uL   Hemoglobin 15.8 13.0 - 17.0 g/dL   HCT 45.5 39.0 - 52.0 %   MCV 97.0 80.0 - 100.0 fL   MCH 33.7 26.0 - 34.0 pg   MCHC 34.7 30.0 - 36.0 g/dL   RDW 12.7 11.5 - 15.5 %   Platelets 285 150 - 400 K/uL   nRBC 0.0 0.0 - 0.2 %    Comment: Performed at [facility], [place] [address] [place], [place] [address]  Ethanol     Status: None   Collection Time: 08/09/22  2:07 PM  Result Value Ref Range   Alcohol, Ethyl (B) <10 <10 mg/dL    Comment: (NOTE) Lowest detectable limit for serum alcohol is 10 mg/dL.  For medical purposes only. Performed at [facility], [place] [address], [place], [place] [address]   Troponin I (High Sensitivity)     Status: None    Collection Time: 08/09/22  2:07 PM  Result Value Ref Range   Troponin I (High Sensitivity) 9 <18 ng/L    Comment: (NOTE) Elevated high sensitivity troponin I (hsTnI) values and significant  changes across serial measurements may suggest ACS but many other  chronic and acute conditions are known to elevate hsTnI results.  Refer to the "Links" section for chest pain algorithms and additional  guidance. Performed at [facility], [place] [address], [place], [place] [address]   CBG monitoring, ED     Status: Abnormal   Collection Time: 08/09/22  2:25 PM  Result Value Ref Range   Glucose-Capillary 102 (H) 70 - 99 mg/dL    Comment: Glucose reference range applies only to samples taken after fasting for at least 8 hours.  Troponin I (High Sensitivity)     Status: None   Collection Time: 08/09/22  4:15 PM  Result Value Ref Range   Troponin I (High Sensitivity) 11 <18 ng/L    Comment: (NOTE) Elevated high sensitivity troponin I (hsTnI) values and significant  changes across serial measurements may suggest ACS but many other  chronic and acute conditions are known to elevate hsTnI results.  Refer to the "Links" section for chest pain algorithms and additional  guidance. Performed at [facility], [place] [address], [place],  [address]    CT Head Wo Contrast  Result Date: 08/09/2022 CLINICAL DATA:  Altered mental status EXAM: CT HEAD WITHOUT CONTRAST TECHNIQUE: Contiguous axial images were obtained from the base of the skull through the vertex without intravenous contrast. RADIATION DOSE REDUCTION: This exam was performed according to the departmental dose-optimization program which includes automated exposure control, adjustment of the mA and/or kV according to patient size and/or use of iterative reconstruction technique. COMPARISON:  None Available. FINDINGS: Brain: There is asymmetric hypodensity along the medial aspect of the left temporal lobe (series 2, image 12), which may  be artifactual, but is suspicious for an acute infarct. Recommend further evaluation with brain MRI no evidence of hemorrhage, hydrocephalus, extra-axial collection or mass lesion/mass effect. Vascular: No disproportionately hyperdense vessel or unexpected calcification. Skull: Normal. Negative for fracture or focal lesion. Sinuses/Orbits: No middle ear or mastoid effusion. Mucosal thickening left maxillary sinus. Orbits are unremarkable Other: None. IMPRESSION: Asymmetric hypodensity and loss of gray/white differentiation along the medial aspect of the left temporal lobe may be artifactual, but is suspicious for an acute infarct. Recommend further evaluation with brain MRI.  Electronically Signed   By: Marin Roberts M.D.   On: 08/09/2022 15:11    Pending Labs Unresulted Labs (From admission, onward)     Start     Ordered   08/09/22 1641  TSH  Once,   URGENT        08/09/22 1640   08/09/22 1401  Urinalysis, Routine w reflex microscopic -Urine, Clean Catch  Once,   URGENT       Question:  Specimen Source  Answer:  Urine, Clean Catch   08/09/22 1401   08/09/22 1401  Rapid urine drug screen (hospital performed)  ONCE - STAT,   STAT        08/09/22 1401   Signed and Held  HIV Antibody (routine testing w rflx)  (HIV Antibody (Routine testing w reflex) panel)  Once,   R        Signed and Held   Signed and Held  CBC  Tomorrow morning,   R        Signed and Held   Signed and Held  Comprehensive metabolic panel  Tomorrow morning,   R        Signed and Held            Vitals/Pain Today's Vitals   08/09/22 1355 08/09/22 1713  BP:  132/85  Pulse:  (!) 116  Resp:  16  Temp:  98.8 F (37.1 C)  SpO2:  99%  PainSc: 0-No pain     Isolation Precautions No active isolations  Medications Medications - No data to display  Mobility walks     Focused Assessments Neuro Assessment Handoff:  Swallow screen pass? Yes    NIH Stroke Scale  Interval:  (2 hr neuro check) Level of Consciousness  (1a.)   : Alert, keenly responsive LOC Questions (1b. )   : Answers one question correctly LOC Commands (1c. )   : Performs both tasks correctly Best Gaze (2. )  : Normal Visual (3. )  : No visual loss Facial Palsy (4. )    : Normal symmetrical movements Motor Arm, Left (5a. )   : No drift Motor Arm, Right (5b. ) : No drift Motor Leg, Left (6a. )  : No drift Motor Leg, Right (6b. ) : No drift Limb Ataxia (7. ): Absent Sensory (8. )  : Normal, no sensory loss Best Language (9. )  : No aphasia Dysarthria (10. ): Normal Extinction/Inattention (11.)   : No Abnormality Complete NIHSS TOTAL: 1     Neuro Assessment:   Neuro Checks:    (2 hr neuro check) (08/09/22 1600)  Has TPA been given? No If patient is a Neuro Trauma and patient is going to OR before floor call report to [facility] nurse: [phone] or [phone]   R Recommendations: See Admitting Provider Note  Report given to:   Additional Notes: Pt cannot remember his own birthday even with prompting; otherwise a/o with no obvious deficits noted. Denies pain.

## 2022-08-09 NOTE — H&P (Signed)
TRH H&P    Patient Demographics:    Luke Jimenez, is a 57 y.o. male  MRN: [id number]  DOB - 05-Jun-1965  Admit Date - 08/09/2022  Referring MD/NP/PA: Dr. Melina Copa  Outpatient Primary MD for the patient is Glenis Smoker, MD  Patient coming from: Home  Chief complaint-feeling unwell   HPI:    Luke Jimenez  is a 57 y.o. male, with history of second-degree AV block with pacemaker, presented to ED with complaints of nonspecific complaints of feeling off this morning.  Patient family was concerned that he was having difficulty with speaking over the phone.  Also was having difficulty finding words with forgetfulness.  Patient had slight headache yesterday but no other symptoms.  CT head showed hypodensity in the left temporal lobe suspicious for acute infarct.  MRI could not be obtained as patient has pacemaker, MRI will be obtained in a.m. neurology was consulted, recommended overnight EEG to rule out seizure.  He denies chest pain or shortness of breath. Denies dysuria or abdominal pain Denies nausea vomiting or diarrhea Denies runny or stuffy nose Denies taking any new medications    Review of systems:    In addition to the HPI above,   All other systems reviewed and are negative.    Past History of the following :    Past Medical History:  Diagnosis Date   Bradycardia    Second degree AV block       Past Surgical History:  Procedure Laterality Date   PACEMAKER IMPLANT N/A 12/20/2019   Procedure: PACEMAKER IMPLANT;  Surgeon: Thompson Grayer, MD;  Location: [facility];  Service: Cardiovascular;  Laterality: N/A;      Social History:      Social History   Tobacco Use   Smoking status: Never   Smokeless tobacco: Never  Substance Use Topics   Alcohol use: Yes    Comment: 1 drink a week       Family History :     Family History  Problem Relation Age of Onset   Heart disease  Maternal Grandfather       Home Medications:   Prior to Admission medications   Medication Sig Start Date End Date Taking? Authorizing Provider  cetirizine (ZYRTEC) 10 MG tablet Take 10 mg by mouth daily.    [provider]  chlorpheniramine (CHLOR-TRIMETON) 4 MG tablet Take 4 mg by mouth daily.    [provider]  fluticasone (FLONASE) 50 MCG/ACT nasal spray Place 1 spray into both nostrils daily as needed for allergies or rhinitis.    [provider]  testosterone cypionate (DEPOTESTOSTERONE CYPIONATE) 200 MG/ML injection once a week. 01/29/21   [provider]  thyroid (ARMOUR) 60 MG tablet Take 60 mg by mouth daily. 01/29/21   [provider]     Allergies:    No Known Allergies   Physical Exam:   Vitals  There were no vitals taken for this visit.  1.  General: Appears in no acute distress  2. Psychiatric: Alert, oriented x 3,  intact insight and judgment  3. Neurologic: Cranial nerves II through XII grossly intact, following commands however slow to react  4. HEENMT:  Atraumatic normocephalic, extraocular muscle intact  5. Respiratory : Lungs clear to auscultation bilaterally  6. Cardiovascular : S1-S2, regular, no murmur auscultated  7. Gastrointestinal:  Abdomen is soft, nontender, no organomegaly     Data Review:    CBC Recent Labs  Lab 08/09/22 1407  WBC 8.7  HGB 15.8  HCT 45.5  PLT 285  MCV 97.0  MCH 33.7  MCHC 34.7  RDW 12.7   ------------------------------------------------------------------------------------------------------------------  Results for orders placed or performed during the hospital encounter of 08/09/22 (from the past 48 hour(s))  Comprehensive metabolic panel     Status: Abnormal   Collection Time: 08/09/22  2:07 PM  Result Value Ref Range   Sodium 139 135 - 145 mmol/L   Potassium 4.3 3.5 - 5.1 mmol/L   Chloride 104 98 - 111 mmol/L   CO2 24 22 - 32 mmol/L   Glucose, Bld 114  (H) 70 - 99 mg/dL    Comment: Glucose reference range applies only to samples taken after fasting for at least 8 hours.   BUN 14 6 - 20 mg/dL   Creatinine, Ser 1.03 0.61 - 1.24 mg/dL   Calcium 9.2 8.9 - 10.3 mg/dL   Total Protein 6.9 6.5 - 8.1 g/dL   Albumin 4.2 3.5 - 5.0 g/dL   AST 22 15 - 41 U/L   ALT 12 0 - 44 U/L   Alkaline Phosphatase 56 38 - 126 U/L   Total Bilirubin 0.3 0.3 - 1.2 mg/dL   GFR, Estimated >60 >60 mL/min    Comment: (NOTE) Calculated using the CKD-EPI Creatinine Equation (2021)    Anion gap 11 5 - 15    Comment: Performed at [facility] [address] [place] [address]  CBC     Status: None   Collection Time: 08/09/22  2:07 PM  Result Value Ref Range   WBC 8.7 4.0 - 10.5 K/uL   RBC 4.69 4.22 - 5.81 MIL/uL   Hemoglobin 15.8 13.0 - 17.0 g/dL   HCT 45.5 39.0 - 52.0 %   MCV 97.0 80.0 - 100.0 fL   MCH 33.7 26.0 - 34.0 pg   MCHC 34.7 30.0 - 36.0 g/dL   RDW 12.7 11.5 - 15.5 %   Platelets 285 150 - 400 K/uL   nRBC 0.0 0.0 - 0.2 %    Comment: Performed at [facility], [place] [address] [place], [place] [address]  Ethanol     Status: None   Collection Time: 08/09/22  2:07 PM  Result Value Ref Range   Alcohol, Ethyl (B) <10 <10 mg/dL    Comment: (NOTE) Lowest detectable limit for serum alcohol is 10 mg/dL.  For medical purposes only. Performed at [facility], [place] [address] [place], [place] [address]   Troponin I (High Sensitivity)     Status: None   Collection Time: 08/09/22  2:07 PM  Result Value Ref Range   Troponin I (High Sensitivity) 9 <18 ng/L    Comment: (NOTE) Elevated high sensitivity troponin I (hsTnI) values and significant  changes across serial measurements may suggest ACS but many other  chronic and acute conditions are known to elevate hsTnI results.  Refer to the "Links" section for chest pain algorithms and additional  guidance. Performed at [facility], [place] [address], [place],  [address]  CBG monitoring, ED     Status: Abnormal   Collection Time: 08/09/22  2:25 PM  Result Value Ref Range   Glucose-Capillary 102 (H) 70 - 99 mg/dL    Comment: Glucose reference range applies only to samples taken after fasting for at least 8 hours.    Chemistries  Recent Labs  Lab 08/09/22 1407  NA 139  K 4.3  CL 104  CO2 24  GLUCOSE 114*  BUN 14  CREATININE 1.03  CALCIUM 9.2  AST 22  ALT 12  ALKPHOS 56  BILITOT 0.3   ------------------------------------------------------------------------------------------------------------------  ------------------------------------------------------------------------------------------------------------------ GFR: CrCl cannot be calculated (Unknown ideal weight.). Liver Function Tests: Recent Labs  Lab 08/09/22 1407  AST 22  ALT 12  ALKPHOS 56  BILITOT 0.3  PROT 6.9  ALBUMIN 4.2   No results for input(s): "LIPASE", "AMYLASE" in the last 168 hours. No results for input(s): "AMMONIA" in the last 168 hours. Coagulation Profile: No results for input(s): "INR", "PROTIME" in the last 168 hours. Cardiac Enzymes: No results for input(s): "CKTOTAL", "CKMB", "CKMBINDEX", "TROPONINI" in the last 168 hours. BNP (last 3 results) No results for input(s): "PROBNP" in the last 8760 hours. HbA1C: No results for input(s): "HGBA1C" in the last 72 hours. CBG: Recent Labs  Lab 08/09/22 1425  GLUCAP 102*   Lipid Profile: No results for input(s): "CHOL", "HDL", "LDLCALC", "TRIG", "CHOLHDL", "LDLDIRECT" in the last 72 hours. Thyroid Function Tests: No results for input(s): "TSH", "T4TOTAL", "FREET4", "T3FREE", "THYROIDAB" in the last 72 hours. Anemia Panel: No results for input(s): "VITAMINB12", "FOLATE", "FERRITIN", "TIBC", "IRON", "RETICCTPCT" in the last 72 hours.  --------------------------------------------------------------------------------------------------------------- Urine analysis: No results found for: "COLORURINE",  "APPEARANCEUR", "LABSPEC", "PHURINE", "GLUCOSEU", "HGBUR", "BILIRUBINUR", "KETONESUR", "PROTEINUR", "UROBILINOGEN", "NITRITE", "LEUKOCYTESUR"    Imaging Results:    CT Head Wo Contrast  Result Date: 08/09/2022 CLINICAL DATA:  Altered mental status EXAM: CT HEAD WITHOUT CONTRAST TECHNIQUE: Contiguous axial images were obtained from the base of the skull through the vertex without intravenous contrast. RADIATION DOSE REDUCTION: This exam was performed according to the departmental dose-optimization program which includes automated exposure control, adjustment of the mA and/or kV according to patient size and/or use of iterative reconstruction technique. COMPARISON:  None Available. FINDINGS: Brain: There is asymmetric hypodensity along the medial aspect of the left temporal lobe (series 2, image 12), which may be artifactual, but is suspicious for an acute infarct. Recommend further evaluation with brain MRI no evidence of hemorrhage, hydrocephalus, extra-axial collection or mass lesion/mass effect. Vascular: No disproportionately hyperdense vessel or unexpected calcification. Skull: Normal. Negative for fracture or focal lesion. Sinuses/Orbits: No middle ear or mastoid effusion. Mucosal thickening left maxillary sinus. Orbits are unremarkable Other: None. IMPRESSION: Asymmetric hypodensity and loss of gray/white differentiation along the medial aspect of the left temporal lobe may be artifactual, but is suspicious for an acute infarct. Recommend further evaluation with brain MRI. Electronically Signed   By: Marin Roberts M.D.   On: 08/09/2022 15:11    My personal review of EKG: Rhythm atrial sensed paced rhythm   Assessment & Plan:    Principal Problem:   AMS (altered mental status)   Altered mental status-unclear etiology, patient is slow to respond and having some word finding difficulty.  CT head is unremarkable.  Overnight EEG ordered by neurology.  MRI with and without contrast ordered to rule  out temporal lobe mass.  Will monitor overnight with telemetry.  Follow UA.  Keep n.p.o. check CBG every 6 hours. Hypothyroidism-patient takes thyroid Armour at  home.  Will obtain TSH.    DVT Prophylaxis-   SCD's  AM Labs Ordered, also please review Full Orders  Family Communication: Admission, patients condition and plan of care including tests being ordered have been discussed with the patient  who indicate understanding and agree with the plan and Code Status.  Code Status:  Full code  Admission status: Observation/Inpatient :The appropriate admission status for this patient is INPATIENT. Inpatient status is judged to be reasonable and necessary in order to provide the required intensity of service to ensure the patient's safety. The patient's presenting symptoms, physical exam findings, and initial radiographic and laboratory data in the context of their chronic comorbidities is felt to place them at high risk for further clinical deterioration. Furthermore, it is not anticipated that the patient will be medically stable for discharge from the hospital within 2 midnights of admission. The following factors support the admission status of inpatient.       Time spent in minutes : 60 min   Trigo Winterbottom S Lindsay Soulliere M.D

## 2022-08-09 NOTE — Consult Note (Signed)
Neurology Consultation  Reason for Consult: Altered mental status Referring Physician: Dr. Melina Copa  CC: Nonspecifically feeling unwell  History is obtained from: Patient, son and chart  HPI: Lister Capelle is a 57 y.o. male with history of second-degree AV block with pacemaker who presents feeling nonspecifically off.  Patient states he was directed to seek medical attention by his family who are concerned about him because he had some difficulties when he was speaking to them over the phone.  Patient's son, who is in the room states that patient was having some word finding difficulties and forgetfulness.  These difficulties started today after the patient woke up, and he was completely normal yesterday.  He had a slight headache yesterday but has no other symptoms.  Patient states that he has no history of seizures, no family history of seizures, no history of traumatic brain injury and no history of encephalitis or meningitis.  He does not smoke or use illicit drugs but does drink alcohol approximately once a week.   LKW: 3/24 evening TNK given?: no, side of window and unclear if symptoms due to stroke IR Thrombectomy? No, consistent with LVO Modified Rankin Scale: 0-Completely asymptomatic and back to baseline post- stroke  ROS: A complete ROS was performed and is negative except as noted in the HPI.   Past Medical History:  Diagnosis Date   Bradycardia    Second degree AV block      Family History  Problem Relation Age of Onset   Heart disease Maternal Grandfather      Social History:   reports that he has never smoked. He has never used smokeless tobacco. He reports current alcohol use. He reports that he does not use drugs.  Medications No current facility-administered medications for this encounter.  Current Outpatient Medications:    cetirizine (ZYRTEC) 10 MG tablet, Take 10 mg by mouth daily., Disp: , Rfl:    chlorpheniramine (CHLOR-TRIMETON) 4 MG tablet, Take 4 mg by  mouth daily., Disp: , Rfl:    fluticasone (FLONASE) 50 MCG/ACT nasal spray, Place 1 spray into both nostrils daily as needed for allergies or rhinitis., Disp: , Rfl:    testosterone cypionate (DEPOTESTOSTERONE CYPIONATE) 200 MG/ML injection, once a week., Disp: , Rfl:    thyroid (ARMOUR) 60 MG tablet, Take 60 mg by mouth daily., Disp: , Rfl:    Exam: Current vital signs: There were no vitals taken for this visit. Vital signs in last 24 hours:    GENERAL: Awake, alert, in no acute distress Psych: Affect appropriate for situation, patient is calm and cooperative with examination Head: Normocephalic and atraumatic, without obvious abnormality EENT: Normal conjunctivae, moist mucous membranes, no OP obstruction LUNGS: Normal respiratory effort. Non-labored breathing on room air Extremities: warm, well perfused, without obvious deformity  NEURO:  Mental Status: Awake, alert, and oriented to person, able to state he is in the hospital but is unable to state which hospital he is in. He is not able to provide a clear and coherent history of present illness. Speech/Language: speech is clear but halting with some difficulties in word finding.   Comprehension is intact, but patient is unable to name common objects No neglect is noted Cranial Nerves:  II: PERRL visual fields full.  III, IV, VI: EOMI. Lid elevation symmetric and full.  VII: Face is symmetric resting and smiling.  VIII: Hearing intact to voice IX, X: Phonation normal.  XI: Normal sternocleidomastoid and trapezius muscle strength XII: Tongue protrudes midline without fasciculations.  Motor: 5/5 strength is all muscle groups.  Tone is normal. Bulk is normal.  Sensation: Intact to light touch bilaterally in all four extremities. No extinction to DSS present.  Coordination: FTN intact bilaterally. HKS intact bilaterally. No pronator drift.  Gait: Deferred  NIHSS: 1a Level of Conscious.: 0 1b LOC Questions: 0 1c LOC Commands:  0 2 Best Gaze: 0 3 Visual: 0 4 Facial Palsy: 0 5a Motor Arm - left: 0 5b Motor Arm - Right: 0 6a Motor Leg - Left: 0 6b Motor Leg - Right:  7 Limb Ataxia: 0 8 Sensory:0  9 Best Language: 1 10 Dysarthria: 0 11 Extinct. and Inatten.: 0 TOTAL: 1   Labs I have reviewed labs in epic and the results pertinent to this consultation are:   CBC    Component Value Date/Time   WBC 8.7 08/09/2022 1407   RBC 4.69 08/09/2022 1407   HGB 15.8 08/09/2022 1407   HGB 11.7 (L) 12/17/2019 1142   HCT 45.5 08/09/2022 1407   HCT 35.6 (L) 12/17/2019 1142   PLT 285 08/09/2022 1407   PLT 334 12/17/2019 1142   MCV 97.0 08/09/2022 1407   MCV 77 (L) 12/17/2019 1142   MCH 33.7 08/09/2022 1407   MCHC 34.7 08/09/2022 1407   RDW 12.7 08/09/2022 1407   RDW 18.5 (H) 12/17/2019 1142   LYMPHSABS 1.9 12/17/2019 1142   EOSABS 0.1 12/17/2019 1142   BASOSABS 0.0 12/17/2019 1142    CMP     Component Value Date/Time   NA 137 12/17/2019 1142   K 4.5 12/17/2019 1142   CL 102 12/17/2019 1142   CO2 22 12/17/2019 1142   GLUCOSE 93 12/17/2019 1142   BUN 25 (H) 12/17/2019 1142   CREATININE 1.26 12/17/2019 1142   CALCIUM 9.8 12/17/2019 1142   GFRNONAA 63 12/17/2019 1142   GFRAA 73 12/17/2019 1142    Lipid Panel  No results found for: "CHOL", "TRIG", "HDL", "CHOLHDL", "VLDL", "LDLCALC", "LDLDIRECT"   Imaging I have reviewed the images obtained:  CT-scan of the brain: Asymmetric hypodensity and loss of gray-white differentiation along the medial aspect of the left temporal lobe, suspicious for acute infarct  MRI examination of the brain: Pending  Assessment: 56 year old patient with history of second-degree AV block and pacemaker presents with altered mental status.  Sometime today, patient began to have word finding difficulties and forgetfulness and slight confusion.  Patient's son states that patient had forgotten to speak with his employer today and had forgotten to cancel some repair people coming  out to the house, which is not like him.  He has been having difficulties and word finding in communication with family.  According to family, patient's mental status was at his normal baseline yesterday.  On exam, he is able to state that he is in the hospital but cannot state which hospital he is in and has obvious difficulty with word finding.  He has some trouble describing things that he normally does, and is unable to name common objects such as a pen or glasses.  CT head reveals hypodensity in left temporal lobe, suspicious for acute infarct.  Will obtain MRI, however will have to wait until tomorrow for this as patient has a pacemaker.  Will need to obtain the MRI with and without contrast to rule out mass lesion.  Will obtain overnight EEG to rule out seizure activity causing altered mental status.  Impression: Altered mental status with broad differential at this point, to include seizure or mass lesion  of brain.  Recommendations: -Brain MRI with and without contrast -Long-term EEG -Will need full stroke workup if stroke seen on MRI  Pt seen by NP/Neuro and later by MD. Note/plan to be edited by MD as needed.  Baldwin , MSN, AGACNP-BC [facility] See Amion for schedule and pager information 08/09/2022 4:01 PM

## 2022-08-09 NOTE — Progress Notes (Cosign Needed Addendum)
LTM EEG hooked up and running - no initial skin breakdown - push button tested - [facility] monitoring.  Ct compatible leads, not MR compatible

## 2022-08-10 ENCOUNTER — Encounter (HOSPITAL_COMMUNITY): Payer: Self-pay | Admitting: Family Medicine

## 2022-08-10 DIAGNOSIS — I340 Nonrheumatic mitral (valve) insufficiency: Secondary | ICD-10-CM | POA: Diagnosis not present

## 2022-08-10 DIAGNOSIS — I429 Cardiomyopathy, unspecified: Secondary | ICD-10-CM | POA: Diagnosis present

## 2022-08-10 DIAGNOSIS — R569 Unspecified convulsions: Secondary | ICD-10-CM | POA: Diagnosis present

## 2022-08-10 DIAGNOSIS — I110 Hypertensive heart disease with heart failure: Secondary | ICD-10-CM | POA: Diagnosis present

## 2022-08-10 DIAGNOSIS — R4182 Altered mental status, unspecified: Secondary | ICD-10-CM | POA: Diagnosis present

## 2022-08-10 DIAGNOSIS — I472 Ventricular tachycardia, unspecified: Secondary | ICD-10-CM | POA: Diagnosis present

## 2022-08-10 DIAGNOSIS — I63532 Cerebral infarction due to unspecified occlusion or stenosis of left posterior cerebral artery: Secondary | ICD-10-CM | POA: Diagnosis not present

## 2022-08-10 DIAGNOSIS — E1165 Type 2 diabetes mellitus with hyperglycemia: Secondary | ICD-10-CM | POA: Diagnosis present

## 2022-08-10 DIAGNOSIS — R9431 Abnormal electrocardiogram [ECG] [EKG]: Secondary | ICD-10-CM | POA: Diagnosis not present

## 2022-08-10 DIAGNOSIS — Z45018 Encounter for adjustment and management of other part of cardiac pacemaker: Secondary | ICD-10-CM | POA: Diagnosis not present

## 2022-08-10 DIAGNOSIS — E785 Hyperlipidemia, unspecified: Secondary | ICD-10-CM | POA: Diagnosis present

## 2022-08-10 DIAGNOSIS — I618 Other nontraumatic intracerebral hemorrhage: Secondary | ICD-10-CM | POA: Diagnosis present

## 2022-08-10 DIAGNOSIS — I509 Heart failure, unspecified: Secondary | ICD-10-CM | POA: Diagnosis not present

## 2022-08-10 DIAGNOSIS — I639 Cerebral infarction, unspecified: Secondary | ICD-10-CM | POA: Diagnosis not present

## 2022-08-10 DIAGNOSIS — R0981 Nasal congestion: Secondary | ICD-10-CM | POA: Diagnosis present

## 2022-08-10 DIAGNOSIS — I6310 Cerebral infarction due to embolism of unspecified precerebral artery: Secondary | ICD-10-CM | POA: Diagnosis not present

## 2022-08-10 DIAGNOSIS — I442 Atrioventricular block, complete: Secondary | ICD-10-CM | POA: Diagnosis present

## 2022-08-10 DIAGNOSIS — E039 Hypothyroidism, unspecified: Secondary | ICD-10-CM | POA: Diagnosis present

## 2022-08-10 DIAGNOSIS — I63432 Cerebral infarction due to embolism of left posterior cerebral artery: Secondary | ICD-10-CM | POA: Diagnosis present

## 2022-08-10 DIAGNOSIS — I959 Hypotension, unspecified: Secondary | ICD-10-CM | POA: Diagnosis not present

## 2022-08-10 DIAGNOSIS — R4701 Aphasia: Secondary | ICD-10-CM | POA: Diagnosis present

## 2022-08-10 DIAGNOSIS — R413 Other amnesia: Secondary | ICD-10-CM | POA: Diagnosis present

## 2022-08-10 DIAGNOSIS — I3139 Other pericardial effusion (noninflammatory): Secondary | ICD-10-CM | POA: Diagnosis not present

## 2022-08-10 DIAGNOSIS — I5022 Chronic systolic (congestive) heart failure: Secondary | ICD-10-CM | POA: Diagnosis present

## 2022-08-10 DIAGNOSIS — I6389 Other cerebral infarction: Secondary | ICD-10-CM | POA: Diagnosis not present

## 2022-08-10 DIAGNOSIS — R29701 NIHSS score 1: Secondary | ICD-10-CM | POA: Diagnosis present

## 2022-08-10 DIAGNOSIS — Z8249 Family history of ischemic heart disease and other diseases of the circulatory system: Secondary | ICD-10-CM | POA: Diagnosis not present

## 2022-08-10 DIAGNOSIS — Z79899 Other long term (current) drug therapy: Secondary | ICD-10-CM | POA: Diagnosis not present

## 2022-08-10 DIAGNOSIS — I4719 Other supraventricular tachycardia: Secondary | ICD-10-CM | POA: Diagnosis present

## 2022-08-10 LAB — RAPID URINE DRUG SCREEN, HOSP PERFORMED
Amphetamines: NOT DETECTED
Barbiturates: NOT DETECTED
Benzodiazepines: NOT DETECTED
Cocaine: NOT DETECTED
Opiates: NOT DETECTED
Tetrahydrocannabinol: NOT DETECTED

## 2022-08-10 LAB — COMPREHENSIVE METABOLIC PANEL
ALT: 11 U/L (ref 0–44)
AST: 21 U/L (ref 15–41)
Albumin: 3.7 g/dL (ref 3.5–5.0)
Alkaline Phosphatase: 51 U/L (ref 38–126)
Anion gap: 10 (ref 5–15)
BUN: 12 mg/dL (ref 6–20)
CO2: 22 mmol/L (ref 22–32)
Calcium: 8.7 mg/dL — ABNORMAL LOW (ref 8.9–10.3)
Chloride: 101 mmol/L (ref 98–111)
Creatinine, Ser: 0.98 mg/dL (ref 0.61–1.24)
GFR, Estimated: >60 mL/min (ref >60)
Glucose, Bld: 93 mg/dL (ref 70–99)
Potassium: 3.5 mmol/L (ref 3.5–5.1)
Sodium: 133 mmol/L — ABNORMAL LOW (ref 135–145)
Total Bilirubin: 1 mg/dL (ref 0.3–1.2)
Total Protein: 6.4 g/dL — ABNORMAL LOW (ref 6.5–8.1)

## 2022-08-10 LAB — CBC
HCT: 43.1 % (ref 39.0–52.0)
Hemoglobin: 15.3 g/dL (ref 13.0–17.0)
MCH: 33.8 pg (ref 26.0–34.0)
MCHC: 35.5 g/dL (ref 30.0–36.0)
MCV: 95.1 fL (ref 80.0–100.0)
Platelets: 240 10*3/uL (ref 150–400)
RBC: 4.53 MIL/uL (ref 4.22–5.81)
RDW: 12.6 % (ref 11.5–15.5)
WBC: 8.3 10*3/uL (ref 4.0–10.5)
nRBC: 0 % (ref 0.0–0.2)

## 2022-08-10 LAB — URINALYSIS, ROUTINE W REFLEX MICROSCOPIC
Bilirubin Urine: NEGATIVE
Glucose, UA: 50 mg/dL — AB
Hgb urine dipstick: NEGATIVE
Ketones, ur: 80 mg/dL — AB
Leukocytes,Ua: NEGATIVE
Nitrite: NEGATIVE
Protein, ur: NEGATIVE mg/dL
Specific Gravity, Urine: 1.023 (ref 1.005–1.030)
pH: 6 (ref 5.0–8.0)

## 2022-08-10 LAB — T4, FREE: Free T4: 1.02 ng/dL (ref 0.61–1.12)

## 2022-08-10 LAB — GLUCOSE, CAPILLARY
Glucose-Capillary: 100 mg/dL — ABNORMAL HIGH (ref 70–99)
Glucose-Capillary: 94 mg/dL (ref 70–99)

## 2022-08-10 MED ORDER — LEVETIRACETAM 750 MG PO TABS
1500.0000 mg | ORAL_TABLET | Freq: Two times a day (BID) | ORAL | Status: DC
  Administered 2022-08-11 – 2022-08-13 (×5): 1500 mg via ORAL
  Filled 2022-08-10 (×5): qty 2

## 2022-08-10 MED ORDER — SODIUM CHLORIDE 0.9 % IV SOLN
3000.0000 mg | Freq: Once | INTRAVENOUS | Status: AC
  Administered 2022-08-10: 3000 mg via INTRAVENOUS
  Filled 2022-08-10: qty 30

## 2022-08-10 NOTE — Care Management Note (Signed)
MRI has been scheduled for tomorrow, 3/27 at 0830am.  This will have to be coordinated with the attending PA or NP due to the patient having a pacemaker.

## 2022-08-10 NOTE — Progress Notes (Signed)
   08/10/22 0451  Vitals  BP 105/80  BP Location Left Arm  BP Method Manual  Patient Position (if appropriate) Lying  Pulse Rate (!) 102  Pulse Rate Source Apical  MEWS COLOR  MEWS Score Color Green  MEWS Score  MEWS Temp 0  MEWS Systolic 0  MEWS Pulse 1  MEWS RR 0  MEWS LOC 0  MEWS Score 1  Provider Notification  Provider Name/Title Babs Bertin MD  Date Provider Notified 08/10/22  Time Provider Notified 0501  Method of Notification Page  Notification Reason Change in status  Provider response No new orders  Date of Provider Response 08/10/22  Time of Provider Response 0504  Note  Observations asymptomatic

## 2022-08-10 NOTE — Plan of Care (Signed)
  Problem: Education: Goal: Knowledge of General Education information will improve Description Including pain rating scale, medication(s)/side effects and non-pharmacologic comfort measures Outcome: Progressing   

## 2022-08-10 NOTE — TOC Initial Note (Signed)
Transition of Care [facility]) - Initial/Assessment Note    Patient Details  Name: Luke Jimenez MRN: [id number] Date of Birth: June 21, 1965  [facility] CM/SW Contact:    Pollie Friar, RN Phone Number: 08/10/2022, 11:29 AM  Clinical Narrative:                 Pt is from home with his son, daughter and daughter in law. He says there are times during the day that he is alone.  No DME at home. Pt was driving self as needed but family can assist after d/c.  Pt wasn't taking any prescription medications at home. [facility] choice [facility] [address].  Pt doesn't have a PCP. He did not want CM to assist in finding one at this time.  Family will transport home when medically ready for d/c.    Expected Discharge Plan: Home/Self Care Barriers to Discharge: Continued Medical Work up   Patient Goals and CMS Choice            Expected Discharge Plan and Services       Living arrangements for the past 2 months: Single Family Home                                      Prior Living Arrangements/Services Living arrangements for the past 2 months: Single Family Home Lives with:: Adult Children Patient language and need for interpreter reviewed:: Yes Do you feel safe going back to the place where you live?: Yes            Criminal Activity/Legal Involvement Pertinent to Current Situation/Hospitalization: No - Comment as needed  Activities of Daily Living Home Assistive Devices/Equipment: None ADL Screening (condition at time of admission) Patient's cognitive ability adequate to safely complete daily activities?: Yes Is the patient deaf or have difficulty hearing?: No Does the patient have difficulty seeing, even when wearing glasses/contacts?: No Does the patient have difficulty concentrating, remembering, or making decisions?: No Patient able to express need for assistance with ADLs?: Yes Does the patient have difficulty dressing or bathing?:  No Independently performs ADLs?: Yes (appropriate for developmental age) Does the patient have difficulty walking or climbing stairs?: No Weakness of Legs: None Weakness of Arms/Hands: None  Permission Sought/Granted                  Emotional Assessment Appearance:: Appears stated age Attitude/Demeanor/Rapport: Engaged Affect (typically observed): Accepting Orientation: : Oriented to Self, Oriented to Place, Oriented to Situation, Oriented to  Time (slow to process)   Psych Involvement: No (comment)  Admission diagnosis:  Altered mental status, unspecified altered mental status type [R41.82] AMS (altered mental status) [R41.82] Patient Active Problem List   Diagnosis Date Noted   AMS (altered mental status) 08/09/2022   ATRIOVENTRICULAR BLOCK, 2ND DEGREE 06/17/2010   PCP:  Glenis Smoker, MD Pharmacy:   [id number] [place] [place] [address] Phone: [phone] Fax: [phone]  [id number] [place], [address] [place] [address] Phone: [phone] Fax: [phone]     Social Determinants of Health (SDOH) Social History: SDOH Screenings   Food Insecurity: No Food Insecurity (08/10/2022)  Housing: Low Risk  (08/10/2022)  Transportation Needs: No Transportation Needs (08/10/2022)  Utilities: Not At Risk (08/10/2022)  Tobacco Use: Low Risk  (  08/10/2022)   SDOH Interventions:     Readmission Risk Interventions     No data to display

## 2022-08-10 NOTE — Progress Notes (Signed)
LTM maint complete - no skin breakdown  Impedance below 10kOhms  [facility] monitored, Event button test confirmed by [facility].   Regular leads replaced with MR compatible leads.

## 2022-08-10 NOTE — Procedures (Signed)
Patient Name: Luke Jimenez  MRN: [id number]  Epilepsy Attending: Lora Havens  Referring Physician/Provider: Katy Apo, NP  Duration: 08/09/2022 2224 to 08/10/2022 2224  Patient history: 57 y.o. male, with history of second-degree AV block with pacemaker, presented to ED with complaints of nonspecific complaints of feeling off this morning.  Patient family was concerned that he was having difficulty with speaking over the phone.  Also was having difficulty finding words with forgetfulness. EEG to evaluate for seizure  Level of alertness: Awake, asleep  AEDs during EEG study: None  Technical aspects: This EEG study was done with scalp electrodes positioned according to the 10-20 International system of electrode placement. Electrical activity was reviewed with band pass filter of 1-70Hz , sensitivity of 7 uV/mm, display speed of 75mm/sec with a 60Hz  notched filter applied as appropriate. EEG data were recorded continuously and digitally stored.  Video monitoring was available and reviewed as appropriate.  Description: The posterior dominant rhythm consists of 9-10 Hz activity of moderate voltage (25-35 uV) seen predominantly in posterior head regions, symmetric and reactive to eye opening and eye closing. Sleep was characterized by vertex waves, sleep spindles (12 to 14 Hz), maximal frontocentral region. EEG showed continuous 3 to 6 Hz theta-delta slowing in left temporal region. Hyperventilation and photic stimulation were not performed.     Seizure without clinical signs were noted arising from left temporal region. During seizure, EEG showed 45 Hz theta slowing in left frontotemporal region which then involved all of left hemisphere and eventually right hemisphere and then evolved into 2 to 3 Hz delta slowing.  Seizures were noted on 08/10/2022 at [place], [place], [place], [place], [place], [place], [place], [place], [place], [place], [place], [id number], [place], [place], [place], [place], 1743, 1853, 2012, 2157. Average duration  of seizures was about 5 minutes.   ABNORMALITY - Seizure without clinical signs, left temporal region - Continuous slow, left temporal region  IMPRESSION: This study showed seizure without clinical signs were noted arising from left temporal region as described above, average duration about 5 minutes. Last seizure was noted on 08/10/2022 at 2157. Additionally there is cortical dysfunction arising from left temporal region likely secondary to underlying structural abnormality.   Nataly Pacifico [name]

## 2022-08-10 NOTE — Care Management Note (Signed)
Nurse for patient called.  She advised that the MRI has been cancelled for today, therefore, the EEG does not have to be discontinued.

## 2022-08-10 NOTE — Progress Notes (Signed)
[facility]  PROGRESS NOTE  Luke Jimenez [id number] DOB: November 01, 1965 DOA: 08/09/2022 PCP: Glenis Smoker, MD   Brief HPI:    57 y.o. male, with history of second-degree AV block with pacemaker, presented to ED with complaints of nonspecific complaints of feeling off this morning.  Patient family was concerned that he was having difficulty with speaking over the phone.  Also was having difficulty finding words with forgetfulness.  Patient had slight headache yesterday but no other symptoms.  CT head showed hypodensity in the left temporal lobe suspicious for acute infarct.  MRI could not be obtained as patient has pacemaker, MRI will be obtained in a.m. neurology was consulted, recommended overnight EEG to rule out seizure.     Subjective   Patient seen and examined, feels much better this morning however still having difficulty comprehending written words.   Assessment/Plan:     Altered mental status -Resolved -Unclear etiology -CT head showed hypodensity in the left temporal lobe suspicious for acute infarct -MRI with and without contrast ordered, currently pending; not done yesterday as patient has pacemaker -Continuous EEG monitoring underway -Neurology following  Hypothyroidism -Patient takes thyroid Armour at home -TSH low at 0.063 -Obtain T3 and free T4 -Continue to hold thyroid Armour    Medications      Data Reviewed:   CBG:  Recent Labs  Lab 08/09/22 1425 08/10/22 0635  GLUCAP 102* 100*    SpO2: 100 %    Vitals:   08/09/22 2322 08/10/22 0353 08/10/22 0451 08/10/22 0700  BP: 131/89 96/86 105/80 (!) 115/90  Pulse: (!) 104 (!) 105 (!) 102 (!) 54  Resp: 18 18  18   Temp: 98.5 F (36.9 C) 99.9 F (37.7 C)  98.6 F (37 C)  TempSrc: Oral Oral  Oral  SpO2: 100% 99%  100%  Weight:    69 kg  Height:    5\' 10"  (1.778 m)      Data Reviewed:  Basic Metabolic Panel: Recent Labs  Lab 08/09/22 1407 08/10/22 0711  NA 139 133*  K 4.3  3.5  CL 104 101  CO2 24 22  GLUCOSE 114* 93  BUN 14 12  CREATININE 1.03 0.98  CALCIUM 9.2 8.7*    CBC: Recent Labs  Lab 08/09/22 1407 08/10/22 0711  WBC 8.7 8.3  HGB 15.8 15.3  HCT 45.5 43.1  MCV 97.0 95.1  PLT 285 240    LFT Recent Labs  Lab 08/09/22 1407 08/10/22 0711  AST 22 21  ALT 12 11  ALKPHOS 56 51  BILITOT 0.3 1.0  PROT 6.9 6.4*  ALBUMIN 4.2 3.7     Antibiotics: Anti-infectives (From admission, onward)    None        DVT prophylaxis: SCDs  Code Status: Full code  Family Communication: Discussed with patient's wife on phone on 08/09/2022   CONSULTS neurology   Objective    Physical Examination:   General-appears in no acute distress Heart-S1-S2, regular, no murmur auscultated Lungs-clear to auscultation bilaterally, no wheezing or crackles auscultated Abdomen-soft, nontender, no organomegaly Extremities-no edema in the lower extremities Neuro-alert, oriented x3, no focal deficit noted   Status is: Inpatient:             Luke Jimenez   [facility] If 7PM-7AM, please contact night-coverage at [email or web address], Office  [phone]   08/10/2022, 9:04 AM  LOS: 0 days

## 2022-08-10 NOTE — Progress Notes (Signed)
Neurology Progress Note  Brief HPI: 57 y.o. male with PMHx of second-degree AV block s/p PPM who presented to the ED 3/25 for evaluation of feeling "off" but without specific complaints.  Patient's family expressed concerns for the patient having difficulty speaking over the telephone, word finding difficulty, and forgetfulness.  Subjective: Patient endorses improvement in forgetfulness and word finding difficulties though states that reading words is still challenging.  He endorses improvement in confusion as well but also states that he is feeling "tired".  He does have some left/right confusion on examination this morning but with improvement overall.   EEG overnight with evidence of subclinical seizures.   Exam: Vitals:   08/10/22 0451 08/10/22 0700  BP: 105/80 (!) 115/90  Pulse: (!) 102 (!) 54  Resp:  18  Temp:  98.6 F (37 C)  SpO2:  100%   Gen: In bed, NAD Resp: non-labored breathing, no acute distress on room air  Abd: soft, nt  Neuro: Mental Status: Awake, alert, and oriented throughout.  Follows commands but does have some left/right confusion and delay in following commands at times.  Names objects and repeats phrases without error.  Cranial Nerves: PERRL, EOMI, VFF, face is symmetric resting and with movement, hearing is intact to voice Motor: Moves all extremities spontaneously and antigravity without asymmetry Sensory: intact and symmetric to light touch throughout Gait: Deferred  Pertinent Labs: CBC    Component Value Date/Time   WBC 8.3 08/10/2022 0711   RBC 4.53 08/10/2022 0711   HGB 15.3 08/10/2022 0711   HGB 11.7 (L) 12/17/2019 1142   HCT 43.1 08/10/2022 0711   HCT 35.6 (L) 12/17/2019 1142   PLT 240 08/10/2022 0711   PLT 334 12/17/2019 1142   MCV 95.1 08/10/2022 0711   MCV 77 (L) 12/17/2019 1142   MCH 33.8 08/10/2022 0711   MCHC 35.5 08/10/2022 0711   RDW 12.6 08/10/2022 0711   RDW 18.5 (H) 12/17/2019 1142   LYMPHSABS 1.9 12/17/2019 1142   EOSABS  0.1 12/17/2019 1142   BASOSABS 0.0 12/17/2019 1142   CMP     Component Value Date/Time   NA 133 (L) 08/10/2022 0711   NA 137 12/17/2019 1142   K 3.5 08/10/2022 0711   CL 101 08/10/2022 0711   CO2 22 08/10/2022 0711   GLUCOSE 93 08/10/2022 0711   BUN 12 08/10/2022 0711   BUN 25 (H) 12/17/2019 1142   CREATININE 0.98 08/10/2022 0711   CALCIUM 8.7 (L) 08/10/2022 0711   PROT 6.4 (L) 08/10/2022 0711   ALBUMIN 3.7 08/10/2022 0711   AST 21 08/10/2022 0711   ALT 11 08/10/2022 0711   ALKPHOS 51 08/10/2022 0711   BILITOT 1.0 08/10/2022 0711   GFRNONAA >60 08/10/2022 0711   GFRAA 73 12/17/2019 1142   Imaging Reviewed:  CT head: Asymmetric hypodensity and loss of gray-white differentiation along the medial aspect of the left temporal lobe, suspicious for acute infarct   MRI brain: Pending  EEG overnight 08/09/22 - 08/10/22: "ABNORMALITY - Seizure without clinical signs, left temporal region - Continuous slow, left temporal region  IMPRESSION: This study showed seizure without clinical signs were noted arising from left temporal region as described above, average duration about 5 minutes. Additionally there is cortical dysfunction arising from left temporal region likely secondary to underlying structural abnormality."  Assessment: 57 year old patient with history of second-degree AV block and PPM presents with AMS with word finding difficulties, forgetfulness, and slight confusion.  Patient's son states that patient had forgotten to speak  with his employer today and had forgotten to cancel some repair people coming out to the house, which is not like him.   On exam, he is able to state that he is in the hospital but cannot state which hospital he is in and has obvious difficulty with word finding.  He initially had some trouble describing things that he normally does, and is unable to name common objects such as a pen or glasses.  CT head reveals hypodensity in left temporal lobe, suspicious  for acute infarct.  Will obtain MRI when able due to patient's PPM.  Will need to obtain the MRI with and without contrast to rule out mass lesion.  Will obtain overnight EEG to rule out seizure activity causing altered mental status.   Impression: Presentation is most consistent with seizures identified on EEG. Seizure etiology pending further work up. MRI brain with and without contrast pending for evaluation of possible mass lesion of the brain.    Recommendations: - MRI brain pending (PPM) - Overnight EEG with evidence of left temporal seizures, EEG to be discontinued for MRI and will be reconnected following as leads are not MRI compatible - Load with Keppra 3,000 mg IV once followed by Keppra 1,500 mg BID PO daily - Seizure precautions - 2 mg IV Ativan for clinical seizure activity lasting > 3 minutes and notify neurology  Anibal Henderson, AGACNP-BC [facility] [phone]

## 2022-08-11 ENCOUNTER — Inpatient Hospital Stay (HOSPITAL_COMMUNITY): Payer: No Typology Code available for payment source

## 2022-08-11 DIAGNOSIS — I63532 Cerebral infarction due to unspecified occlusion or stenosis of left posterior cerebral artery: Secondary | ICD-10-CM | POA: Diagnosis not present

## 2022-08-11 DIAGNOSIS — R4701 Aphasia: Secondary | ICD-10-CM

## 2022-08-11 DIAGNOSIS — I639 Cerebral infarction, unspecified: Secondary | ICD-10-CM

## 2022-08-11 DIAGNOSIS — R4182 Altered mental status, unspecified: Secondary | ICD-10-CM | POA: Diagnosis not present

## 2022-08-11 DIAGNOSIS — R569 Unspecified convulsions: Secondary | ICD-10-CM | POA: Diagnosis not present

## 2022-08-11 LAB — GLUCOSE, CAPILLARY
Glucose-Capillary: 100 mg/dL — ABNORMAL HIGH (ref 70–99)
Glucose-Capillary: 101 mg/dL — ABNORMAL HIGH (ref 70–99)
Glucose-Capillary: 88 mg/dL (ref 70–99)
Glucose-Capillary: 89 mg/dL (ref 70–99)
Glucose-Capillary: 97 mg/dL (ref 70–99)

## 2022-08-11 LAB — HEMOGLOBIN A1C
Hgb A1c MFr Bld: 5.4 % (ref 4.8–5.6)
Mean Plasma Glucose: 108 mg/dL

## 2022-08-11 LAB — T3: T3, Total: 119 ng/dL (ref 71–180)

## 2022-08-11 MED ORDER — IOHEXOL 350 MG/ML SOLN
75.0000 mL | Freq: Once | INTRAVENOUS | Status: AC | PRN
  Administered 2022-08-11: 75 mL via INTRAVENOUS

## 2022-08-11 MED ORDER — ASPIRIN 325 MG PO TBEC
325.0000 mg | DELAYED_RELEASE_TABLET | Freq: Every day | ORAL | Status: DC
  Administered 2022-08-11 – 2022-08-13 (×3): 325 mg via ORAL
  Filled 2022-08-11 (×3): qty 1

## 2022-08-11 MED ORDER — STROKE: EARLY STAGES OF RECOVERY BOOK
Freq: Once | Status: AC
  Filled 2022-08-11: qty 1

## 2022-08-11 MED ORDER — LACOSAMIDE 200 MG PO TABS
200.0000 mg | ORAL_TABLET | Freq: Two times a day (BID) | ORAL | Status: DC
  Administered 2022-08-12 – 2022-08-13 (×3): 200 mg via ORAL
  Filled 2022-08-11 (×3): qty 1

## 2022-08-11 MED ORDER — GADOBUTROL 1 MMOL/ML IV SOLN
7.0000 mL | Freq: Once | INTRAVENOUS | Status: AC | PRN
  Administered 2022-08-11: 7 mL via INTRAVENOUS

## 2022-08-11 MED ORDER — SODIUM CHLORIDE 0.9 % IV SOLN
400.0000 mg | Freq: Once | INTRAVENOUS | Status: AC
  Administered 2022-08-11: 400 mg via INTRAVENOUS
  Filled 2022-08-11: qty 40

## 2022-08-11 NOTE — Progress Notes (Signed)
[facility]  PROGRESS NOTE  Luke Jimenez [id number] DOB: [birth year] DOA: 08/09/2022 PCP: Glenis Smoker, MD   Brief HPI:    57 y.o. male, with history of second-degree AV block with pacemaker, presented to ED with complaints of nonspecific complaints of feeling off this morning.  Patient family was concerned that he was having difficulty with speaking over the phone.  Also was having difficulty finding words with forgetfulness.  Patient had slight headache yesterday but no other symptoms.  CT head showed hypodensity in the left temporal lobe suspicious for acute infarct.  MRI could not be obtained as patient has pacemaker, MRI will be obtained in a.m. neurology was consulted, recommended overnight EEG to rule out seizure.     Subjective   Patient seen and examined, MRI brain shows acute infarct involving PCA territory.   Assessment/Plan:     Acute PCA territory infarct -MRI brain shows acute infarct involving PCA territory -Stroke workup has been ordered per neurology -Patient earlier presented with altered mental status, CT head showed hypodensity in the left temporal lobe suspicious for acute infarct -MRI with and without contrast ordered, currently pending; not done yesterday as patient has pacemaker -MRI was done this morning -Continuous EEG monitoring showed  evidence of left temporal seizures  -started on Keppra per neurology -Neurology following  Hypothyroidism -Patient takes thyroid Armour at home -TSH low at 0.063 - T3 119 and free T4 is 1.02 -Patient does not remember taking thyroid Armour at home -Will check with patient's wife   Medications     levETIRAcetam  1,500 mg Oral BID     Data Reviewed:   CBG:  Recent Labs  Lab 08/09/22 1425 08/10/22 0635 08/10/22 1915 08/11/22 0014 08/11/22 0608  GLUCAP 102* 100* 94 100* 97    SpO2: 100 %    Vitals:   08/10/22 2314 08/11/22 0340 08/11/22 0621 08/11/22 0804  BP: 109/78 (!) 88/64 (!)  88/72 102/74  Pulse: 95 90  95  Resp: 18 16  18   Temp: 98.2 F (36.8 C) 98.1 F (36.7 C)  98.3 F (36.8 C)  TempSrc: Oral Oral  Oral  SpO2: 91% 95%  100%  Weight:      Height:          Data Reviewed:  Basic Metabolic Panel: Recent Labs  Lab 08/09/22 1407 08/10/22 0711  NA 139 133*  K 4.3 3.5  CL 104 101  CO2 24 22  GLUCOSE 114* 93  BUN 14 12  CREATININE 1.03 0.98  CALCIUM 9.2 8.7*    CBC: Recent Labs  Lab 08/09/22 1407 08/10/22 0711  WBC 8.7 8.3  HGB 15.8 15.3  HCT 45.5 43.1  MCV 97.0 95.1  PLT 285 240    LFT Recent Labs  Lab 08/09/22 1407 08/10/22 0711  AST 22 21  ALT 12 11  ALKPHOS 56 51  BILITOT 0.3 1.0  PROT 6.9 6.4*  ALBUMIN 4.2 3.7     Antibiotics: Anti-infectives (From admission, onward)    None        DVT prophylaxis: SCDs  Code Status: Full code  Family Communication: Discussed with patient's wife on phone on 08/09/2022   CONSULTS neurology   Objective    Physical Examination:   General-appears in no acute distress Heart-S1-S2, regular, no murmur auscultated Lungs-clear to auscultation bilaterally, no wheezing or crackles auscultated Abdomen-soft, nontender, no organomegaly Extremities-no edema in the lower extremities Neuro-alert, oriented x3, no focal deficit noted   Status is: Inpatient:  [name]   [facility] If 7PM-7AM, please contact night-coverage at [email or web address], Office  [phone]   08/11/2022, 8:55 AM  LOS: 1 day

## 2022-08-11 NOTE — Progress Notes (Signed)
Informed of MRI for today.   Device system confirmed to be MRI conditional, with implant date > 6 weeks ago, and no evidence of abandoned or epicardial leads in review of most recent CXR Interrogation from today performed by industry.  Pt in atrial arrhyhtmia ~ 130s, underlying 2:1 AV block.   Change device settings for MRI to VOO at 80 bpm.   Tachy-therapies to off if applicable.  Program device back to pre-MRI settings after completion of exam.  Shirley Friar, PA-C  08/11/2022 9:03 AM

## 2022-08-11 NOTE — Plan of Care (Signed)
  Problem: Education: Goal: Knowledge of General Education information will improve Description Including pain rating scale, medication(s)/side effects and non-pharmacologic comfort measures Outcome: Progressing   

## 2022-08-11 NOTE — Progress Notes (Signed)
PT Cancellation Note  Patient Details Name: Luke Jimenez MRN: [id number] DOB: 1965-12-22   Cancelled Treatment:    Reason Eval/Treat Not Completed: Patient at procedure or test/unavailable  Wyona Almas, PT, DPT Acute Rehabilitation Services Office [place] 08/11/2022, 3:55 PM

## 2022-08-11 NOTE — Progress Notes (Signed)
Neurology Progress Note  Brief HPI: 57 y.o. male with PMHx of second-degree AV block s/p PPM who presented to the ED 3/25 for evaluation of feeling "off" but without specific complaints.  Patient's family expressed concerns for the patient having difficulty speaking over the telephone, word finding difficulty, and forgetfulness.  Subjective: Patient endorses improvement reading and confusion at this time. He is heading down for MRI.   Update: MRI shows an acute ischemic infarct in left PCA territory. Stroke work up ordered. LTM still showing seizures from the left temporal region.   Exam: Vitals:   08/11/22 0621 08/11/22 0804  BP: (!) 88/72 102/74  Pulse:  95  Resp:  18  Temp:  98.3 F (36.8 C)  SpO2:  100%   Gen: In bed, NAD Resp: non-labored breathing, no acute distress on room air  Abd: soft, nt  Neuro: Mental Status: Awake, alert, and oriented throughout.  Reading intact but delayed. Able to do simple addition and subtraction and name objects Cranial Nerves: PERRL, EOMI, VFF, face is symmetric resting and with movement, hearing is intact to voice Motor: Moves all extremities spontaneously and antigravity without asymmetry Sensory: intact and symmetric to light touch throughout Gait: Deferred  Pertinent Labs: CBC    Component Value Date/Time   WBC 8.3 08/10/2022 0711   RBC 4.53 08/10/2022 0711   HGB 15.3 08/10/2022 0711   HGB 11.7 (L) 12/17/2019 1142   HCT 43.1 08/10/2022 0711   HCT 35.6 (L) 12/17/2019 1142   PLT 240 08/10/2022 0711   PLT 334 12/17/2019 1142   MCV 95.1 08/10/2022 0711   MCV 77 (L) 12/17/2019 1142   MCH 33.8 08/10/2022 0711   MCHC 35.5 08/10/2022 0711   RDW 12.6 08/10/2022 0711   RDW 18.5 (H) 12/17/2019 1142   LYMPHSABS 1.9 12/17/2019 1142   EOSABS 0.1 12/17/2019 1142   BASOSABS 0.0 12/17/2019 1142   CMP     Component Value Date/Time   NA 133 (L) 08/10/2022 0711   NA 137 12/17/2019 1142   K 3.5 08/10/2022 0711   CL 101 08/10/2022 0711    CO2 22 08/10/2022 0711   GLUCOSE 93 08/10/2022 0711   BUN 12 08/10/2022 0711   BUN 25 (H) 12/17/2019 1142   CREATININE 0.98 08/10/2022 0711   CALCIUM 8.7 (L) 08/10/2022 0711   PROT 6.4 (L) 08/10/2022 0711   ALBUMIN 3.7 08/10/2022 0711   AST 21 08/10/2022 0711   ALT 11 08/10/2022 0711   ALKPHOS 51 08/10/2022 0711   BILITOT 1.0 08/10/2022 0711   GFRNONAA >60 08/10/2022 0711   GFRAA 73 12/17/2019 1142   Imaging Reviewed:  CT head: Asymmetric hypodensity and loss of gray-white differentiation along the medial aspect of the left temporal lobe, suspicious for acute infarct   MRI brain: Acute left PCA territory infarcts  EEG overnight 08/09/22 - 08/10/22: "ABNORMALITY - Seizure without clinical signs, left temporal region - Continuous slow, left temporal region  IMPRESSION: This study showed seizure without clinical signs were noted arising from left temporal region as described above, average duration about 5 minutes. Additionally there is cortical dysfunction arising from left temporal region likely secondary to underlying structural abnormality."  Assessment: 57 year old patient with history of second-degree AV block and PPM presents with AMS with word finding difficulties, forgetfulness, and slight confusion.  Patient's son states that patient had forgotten to speak with his employer today and had forgotten to cancel some repair people coming out to the house, which is not like him.  On exam, he is able to state that he is in the hospital but cannot state which hospital he is in and has obvious difficulty with word finding.  He initially had some trouble describing things that he normally does, and is unable to name common objects such as a pen or glasses.  CT head reveals hypodensity in left temporal lobe, suspicious for acute infarct.  Will obtain MRI when able due to patient's PPM.  Will need to obtain the MRI with and without contrast to rule out mass lesion.  Will obtain overnight EEG to  rule out seizure activity causing altered mental status.   Impression: Presentation is most consistent with seizures identified on EEG. Seizure etiology pending further work up. MRI brain with and without contrast pending for evaluation of possible mass lesion of the brain.    Recommendations: - Overnight EEG with evidence of left temporal seizures, EEG to be discontinued for MRI and will be reconnected following as leads are not MRI compatible - Load with Keppra 3,000 mg IV once followed by Keppra 1,500 mg BID PO daily - Seizure precautions - 2 mg IV Ativan for clinical seizure activity lasting > 3 minutes and notify neurology - Stroke work up - HgbA1c, fasting lipid panel - CTA Head and Neck - Frequent neuro checks - Echocardiogram - Prophylactic therapy-Antiplatelet med: Aspirin 325mg  - Risk factor modification - Telemetry monitoring - PT consult, OT consult, Speech consult - Stroke team to follow   Patient seen and examined by NP/APP with MD. MD to update note as needed.   Janine Ores, DNP, FNP-BC [facility] Pager: [phone]

## 2022-08-11 NOTE — Plan of Care (Signed)
  Problem: Education: Goal: Knowledge of General Education information will improve Description Including pain rating scale, medication(s)/side effects and non-pharmacologic comfort measures Outcome: Progressing   Problem: Health Behavior/Discharge Planning: Goal: Ability to manage health-related needs will improve Outcome: Progressing   

## 2022-08-11 NOTE — Progress Notes (Signed)
Bilateral lower extremity venous study completed.   Preliminary results relayed to RN.  Please see CV Procedures for preliminary results.  Mckala Pantaleon, RVT  2:44 PM 08/11/22

## 2022-08-11 NOTE — Plan of Care (Signed)
  Problem: Education: Goal: Knowledge of General Education information will improve Description: Including pain rating scale, medication(s)/side effects and non-pharmacologic comfort measures 08/11/2022 1408 by Emmaline Life, RN Outcome: Progressing 08/11/2022 1408 by Emmaline Life, RN Outcome: Progressing   Problem: Health Behavior/Discharge Planning: Goal: Ability to manage health-related needs will improve 08/11/2022 1408 by Emmaline Life, RN Outcome: Progressing 08/11/2022 1408 by Emmaline Life, RN Outcome: Progressing

## 2022-08-11 NOTE — Procedures (Incomplete)
Patient Name: Luke Jimenez  MRN: [id number]  Epilepsy Attending: Lora Havens  Referring Physician/Provider: Katy Apo, NP  Duration: 08/10/2022 2329 to 08/12/2022 0600   Patient history: 57 y.o. male, with history of second-degree AV block with pacemaker, presented to ED with complaints of nonspecific complaints of feeling off this morning.  Patient family was concerned that he was having difficulty with speaking over the phone.  Also was having difficulty finding words with forgetfulness. EEG to evaluate for seizure   Level of alertness: Awake, asleep   AEDs during EEG study: LEV, LCM   Technical aspects: This EEG study was done with scalp electrodes positioned according to the 10-20 International system of electrode placement. Electrical activity was reviewed with band pass filter of 1-70Hz , sensitivity of 7 uV/mm, display speed of 77mm/sec with a 60Hz  notched filter applied as appropriate. EEG data were recorded continuously and digitally stored.  Video monitoring was available and reviewed as appropriate.   Description: The posterior dominant rhythm consists of 9-10 Hz activity of moderate voltage (25-35 uV) seen predominantly in posterior head regions, symmetric and reactive to eye opening and eye closing. Sleep was characterized by vertex waves, sleep spindles (12 to 14 Hz), maximal frontocentral region. EEG showed continuous 3 to 6 Hz theta-delta slowing in left temporal region. Hyperventilation and photic stimulation were not performed.      Seizure without clinical signs were noted arising from left temporal region. During seizure, EEG showed 4-5 Hz theta slowing in left frontotemporal region which then involved all of left hemisphere and eventually right hemisphere and then evolved into 2 to 3 Hz delta slowing.  Seizures were noted on 08/11/2022 at [place], [place], [address], [place]. Average duration of seizures was about 5 minutes. Patient was then loaded with Vimpat and subsequently no  seizures were noted.  EEG was disconnected between 08/11/2022 at 0811 to 1146 for MRI Brain and between 1505 to 1557 for CTA head and neck.    ABNORMALITY - Seizure without clinical signs, left temporal region - Continuous slow, left temporal region   IMPRESSION: This study showed seizure without clinical signs were noted arising from left temporal region as described above, average duration about 5 minutes. Last seizure was noted on 08/11/2022 at 0746.  Patient was then loaded with Vimpat and subsequently no seizures were noted. Additionally there is cortical dysfunction arising from left temporal region likely secondary to underlying structural abnormality.    Quanta Robertshaw [name]

## 2022-08-12 ENCOUNTER — Inpatient Hospital Stay (HOSPITAL_COMMUNITY): Payer: No Typology Code available for payment source

## 2022-08-12 DIAGNOSIS — I639 Cerebral infarction, unspecified: Secondary | ICD-10-CM

## 2022-08-12 DIAGNOSIS — R569 Unspecified convulsions: Secondary | ICD-10-CM | POA: Diagnosis not present

## 2022-08-12 DIAGNOSIS — I6389 Other cerebral infarction: Secondary | ICD-10-CM

## 2022-08-12 DIAGNOSIS — R4182 Altered mental status, unspecified: Secondary | ICD-10-CM | POA: Diagnosis not present

## 2022-08-12 DIAGNOSIS — I429 Cardiomyopathy, unspecified: Secondary | ICD-10-CM | POA: Diagnosis not present

## 2022-08-12 DIAGNOSIS — I340 Nonrheumatic mitral (valve) insufficiency: Secondary | ICD-10-CM

## 2022-08-12 LAB — ECHOCARDIOGRAM COMPLETE BUBBLE STUDY
Area-P 1/2: 4.93 cm2
Calc EF: 27.1 %
MV M vel: 3.77 m/s
MV Peak grad: 56.9 mmHg
Radius: 0.3 cm
S' Lateral: 4.9 cm
Single Plane A2C EF: 32.5 %
Single Plane A4C EF: 27.3 %

## 2022-08-12 LAB — LIPID PANEL
Cholesterol: 210 mg/dL — ABNORMAL HIGH (ref 0–200)
HDL: 50 mg/dL (ref >40)
LDL Cholesterol: 149 mg/dL — ABNORMAL HIGH (ref 0–99)
Total CHOL/HDL Ratio: 4.2 RATIO
Triglycerides: 57 mg/dL (ref <150)
VLDL: 11 mg/dL (ref 0–40)

## 2022-08-12 LAB — GLUCOSE, CAPILLARY
Glucose-Capillary: 85 mg/dL (ref 70–99)
Glucose-Capillary: 126 mg/dL — ABNORMAL HIGH (ref 70–99)
Glucose-Capillary: 113 mg/dL — ABNORMAL HIGH (ref 70–99)

## 2022-08-12 LAB — SEDIMENTATION RATE: Sed Rate: 3 mm/hr (ref 0–16)

## 2022-08-12 LAB — APTT: aPTT: 30 seconds (ref 24–36)

## 2022-08-12 LAB — PROTIME-INR
INR: 1.1 (ref 0.8–1.2)
Prothrombin Time: 13.8 seconds (ref 11.4–15.2)

## 2022-08-12 LAB — C-REACTIVE PROTEIN: CRP: 0.5 mg/dL (ref <1.0)

## 2022-08-12 LAB — D-DIMER, QUANTITATIVE: D-Dimer, Quant: 1.02 ug/mL-FEU — ABNORMAL HIGH (ref 0.00–0.50)

## 2022-08-12 LAB — ANTITHROMBIN III: AntiThromb III Func: 110 % (ref 75–120)

## 2022-08-12 MED ORDER — ROSUVASTATIN CALCIUM 20 MG PO TABS
20.0000 mg | ORAL_TABLET | Freq: Every day | ORAL | Status: DC
  Administered 2022-08-12 – 2022-08-13 (×2): 20 mg via ORAL
  Filled 2022-08-12 (×2): qty 1

## 2022-08-12 MED ORDER — PERFLUTREN LIPID MICROSPHERE
1.0000 mL | INTRAVENOUS | Status: AC | PRN
  Administered 2022-08-12: 2 mL via INTRAVENOUS

## 2022-08-12 MED ORDER — IOHEXOL 350 MG/ML SOLN
75.0000 mL | Freq: Once | INTRAVENOUS | Status: AC | PRN
  Administered 2022-08-12: 75 mL via INTRAVENOUS

## 2022-08-12 NOTE — Evaluation (Addendum)
Occupational Therapy Evaluation Patient Details Name: Luke Jimenez MRN: [id number] DOB: May 08, 1966 Today's Date: 08/12/2022   History of Present Illness Pt is a 57 y.o. male who presented 08/09/22 with speech difficulty and AMS. 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 EEG also showing seizures. 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  He was admitted for above and presents with problem list below.  He completes ADLs with supervision, intermittent cueing for visual scanning and recall of tasks to complete; functional mobility with independence.  He scored 17/28 on short blessed test, significant impairment with deficits noted in time orientation, sequencing, attention and recall.  He is able to functionally complete 2/3 tasks during trail making task with increased time and min cueing to recall final task.  Pt with fair awareness to deficits while completing a task, but unable to recall difficulties after task is completed with no awareness to deficits.  Questionable R inattention throughout session. Recommend 24/7 supervision, assist specifically with med mgmt, transportation, fiances, and cooking.  Will follow acutely and recommend further OT in outpatient setting after dc for higher level cognition.    Recommendations for follow up therapy are one component of a multi-disciplinary discharge planning process, led by the attending physician.  Recommendations may be updated based on patient status, additional functional criteria and insurance authorization.   Assistance Recommended at Discharge Frequent or constant Supervision/Assistance  Patient can return home with the following Direct supervision/assist for financial management;Direct supervision/assist for medications management;Assistance with cooking/housework;Assist for transportation     Functional Status Assessment  Patient has had a recent decline in their functional status and demonstrates the ability to make significant improvements in function in a reasonable and predictable amount of time.  Equipment Recommendations  None recommended by OT    Recommendations for Other Services       Precautions / Restrictions Precautions Precautions: None Restrictions Weight Bearing Restrictions: No      Mobility Bed Mobility Overal bed mobility: Modified Independent             General bed mobility comments: HOB elevated, no assistance needed    Transfers Overall transfer level: Independent Equipment used: None Transfers: Sit to/from Stand Sit to Stand: Independent           General transfer comment: No assistance needed, no LOB      Balance Overall balance assessment: No apparent balance deficits (not formally assessed)                                         ADL either performed or assessed with clinical judgement   ADL Overall ADL's : Needs assistance/impaired     Grooming: Supervision/safety;Standing           Upper Body Dressing : Supervision/safety   Lower Body Dressing: Supervision/safety   Toilet Transfer: Supervision/safety           Functional mobility during ADLs: Supervision/safety General ADL Comments: no assist required, intermittent cueing for cognition and visual scanning to find items     Vision Baseline Vision/History: 1 Wears glasses (reading) Ability to See in Adequate Light: 0 Adequate Patient Visual Report: Other (comment) (mild blurriness in part of R vision only) Vision Assessment?: Yes Eye Alignment: Within Functional Limits Ocular Range of Motion: Within Functional Limits Alignment/Gaze Preference:  Within Defined Limits Tracking/Visual Pursuits: Able to track stimulus in all quads without difficulty Saccades: Within functional limits Convergence: Within functional limits Visual Fields:  No apparent deficits Additional Comments: vision appears WFL, some R inattention possibly     Perception     Praxis      Pertinent Vitals/Pain Pain Assessment Pain Assessment: No/denies pain     Hand Dominance Right   Extremity/Trunk Assessment Upper Extremity Assessment Upper Extremity Assessment: Overall WFL for tasks assessed   Lower Extremity Assessment Lower Extremity Assessment: Defer to PT evaluation LLE Deficits / Details: very mild incoordination noted; strength and sensation intact and symmetrical bil with MMT scores of 5 grossly   Cervical / Trunk Assessment Cervical / Trunk Assessment: Normal   Communication Communication Communication: Other (comment) (mild word finding difficulty)   Cognition Arousal/Alertness: Awake/alert Behavior During Therapy: WFL for tasks assessed/performed Overall Cognitive Status: Impaired/Different from baseline Area of Impairment: Memory, Safety/judgement, Problem solving, Attention, Awareness                   Current Attention Level: Sustained Memory: Decreased short-term memory   Safety/Judgement: Decreased awareness of deficits, Decreased awareness of safety Awareness: Emergent Problem Solving: Slow processing, Requires verbal cues, Difficulty sequencing General Comments: patient oriented and pleasant.  Verbose.  He requires redirection at times.  Short blessed test scoring 17/28 with deficits in time orientation (reports 5pm, its 1230), sequencing, attention and short term memory.  He is able to recall 2/3 tasks to complete functionally without cueing, but noted decreased attention to R side of hallway during tasks.  Pt unable to recall deficits or difficulty with tasks during session.     General Comments  educated pt and spouse on recommendations to have assist for med mgmt, finances, transportation and cooking    Exercises     Shoulder Instructions      Home Living Family/patient expects to be discharged to::  Private residence Living Arrangements: Children;Spouse/significant other (son and daughter-in-law) Available Help at Discharge: Family;Available PRN/intermittently (daughter-in-law has more flexibilty to assist while son is at work and wife is in [place]) Type of Home: House Home Access: Stairs to enter Technical brewer of Steps: 3-5 Entrance Stairs-Rails: Right (on the 3 stairs, easiest route in) Home Layout: Two level;Bed/bath upstairs Alternate Level Stairs-Number of Steps: 15 Alternate Level Stairs-Rails: Left (ascending) Bathroom Shower/Tub: Occupational psychologist: Standard Bathroom Accessibility: Yes   Home Equipment: Shower seat - built in;Grab bars - tub/shower   Additional Comments: wife has been staying with daughter and her new born in [place] recently while son-in-law is deployed; son and daughter-in-law in process of finding their own place; pt may go with wife to [place]  Lives With: Spouse    Prior Functioning/Environment Prior Level of Function : Independent/Modified Independent;Driving             Mobility Comments: No AD, no falls ADLs 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 Problem List: Impaired vision/perception;Decreased cognition;Decreased Surveyor, mining      OT Treatment/Interventions: Self-care/ADL training;Visual/perceptual remediation/compensation;Cognitive remediation/compensation;Patient/family education;Therapeutic activities    OT Goals(Current goals can be found in the care plan section) Acute Rehab OT Goals Patient Stated Goal: home OT Goal Formulation: With patient Time For Goal Achievement: 08/26/22 Potential to Achieve Goals: Good  OT Frequency: Min 2X/week    Co-evaluation              AM-PAC OT "6 Clicks" Daily Activity  Outcome Measure Help from another person eating meals?: None Help from another person taking care of personal grooming?: A Little Help from another person toileting, which  includes using toliet, bedpan, or urinal?: A Little Help from another person bathing (including washing, rinsing, drying)?: A Little Help from another person to put on and taking off regular upper body clothing?: A Little Help from another person to put on and taking off regular lower body clothing?: A Little 6 Click Score: 19   End of Session Nurse Communication: Mobility status  Activity Tolerance: Patient tolerated treatment well Patient left: in bed;with call bell/phone within reach;with family/visitor present  OT Visit Diagnosis: Other symptoms and signs involving cognitive function;Cognitive communication deficit (R41.841) Symptoms and signs involving cognitive functions: Cerebral infarction                Time: [id number] OT Time Calculation (min): 21 min Charges:  OT General Charges $OT Visit: 1 Visit OT Evaluation $OT Eval Moderate Complexity: 1 Mod  Jolaine Artist, OT Acute Rehabilitation Services Office [phone]   Delight Stare 08/12/2022, 1:39 PM

## 2022-08-12 NOTE — Evaluation (Signed)
Physical Therapy Evaluation & Discharge Patient Details Name: Luke Jimenez MRN: [id number] DOB: 26-Mar-1966 Today's Date: 08/12/2022  History of Present Illness  Pt is a 57 y.o. male who presented 08/09/22 with speech difficulty and AMS. 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 EEG also showing seizures. PMH: second-degree AV block with pacemaker, bradycardia   Clinical Impression  Pt presents with condition above and deficits mentioned below, see PT Problem List. PTA, he was independent, working as a Dance movement psychotherapist, and living with his wife, son, and daughter-in-law in a 2-level house with 3-5 STE. The bedrooms are upstairs. The wife has been staying with the daughter and her new born baby in [place] recently while son-in-law is deployed. The son and daughter-in-law are in the process of finding their own place to live. Currently, pt is displaying WFL, intact and symmetrical bil lower extremity strength and sensation but mild incoordination in his L leg. It does not appear to affect his functional mobility though as he was capable of being independent with all functional mobility, demonstrating appropriate dynamic balance with a DGI score of 23/24 today. As pt appears to be at his baseline physically, no further acute or follow-up PT needs are identified. However, he does display some deficits in cognition and potential R peripheral vision. Will defer further assessment/treatment of these deficits to OT and SLP. All education completed and questions answered. PT will sign off.     Recommendations for follow up therapy are one component of a multi-disciplinary discharge planning process, led by the attending physician.  Recommendations may be updated based on patient status, additional functional criteria and insurance authorization.  Follow Up Recommendations       Assistance Recommended at Discharge PRN  Patient can return home with the  following  Direct supervision/assist for medications management;Direct supervision/assist for financial management;Assist for transportation    Equipment Recommendations None recommended by PT  Recommendations for Other Services       Functional Status Assessment Patient has not had a recent decline in their functional status     Precautions / Restrictions Precautions Precautions: None Restrictions Weight Bearing Restrictions: No      Mobility  Bed Mobility Overal bed mobility: Modified Independent             General bed mobility comments: HOB elevated, no assistance needed    Transfers Overall transfer level: Independent Equipment used: None Transfers: Sit to/from Stand Sit to Stand: Independent           General transfer comment: No assistance needed, no LOB    Ambulation/Gait Ambulation/Gait assistance: Independent, Supervision Gait Distance (Feet): 450 Feet Assistive device: None Gait Pattern/deviations: WFL(Within Functional Limits) Gait velocity: WFL Gait velocity interpretation: >4.37 ft/sec, indicative of normal walking speed   General Gait Details: Pt ambulates with no significant gait deviations and no overt LOB. Mild deviation from path with serial head turns L <> R though. Supervision for safety initially but really capable of being independent  Stairs Stairs: Yes Stairs assistance: Supervision Stair Management: No rails, Alternating pattern, Forwards Number of Stairs: 2 General stair comments: Ascends and descends without LOB  Wheelchair Mobility    Modified Rankin (Stroke Patients Only) Modified Rankin (Stroke Patients Only) Pre-Morbid Rankin Score: No symptoms Modified Rankin: Slight disability     Balance Overall balance assessment: No apparent balance deficits (not formally assessed)  Standardized Balance Assessment Standardized Balance Assessment : Dynamic Gait Index   Dynamic Gait  Index Level Surface: Normal Change in Gait Speed: Normal Gait with Horizontal Head Turns: Mild Impairment Gait with Vertical Head Turns: Normal Gait and Pivot Turn: Normal Step Over Obstacle: Normal Step Around Obstacles: Normal Steps: Normal Total Score: 23       Pertinent Vitals/Pain Pain Assessment Pain Assessment: No/denies pain    Home Living Family/patient expects to be discharged to:: Private residence Living Arrangements: Children;Spouse/significant other (son and daughter-in-law) Available Help at Discharge: Family;Available PRN/intermittently (daughter-in-law has more flexibilty to assist while son is at work and wife is in [place]) Type of Home: House Home Access: Stairs to enter Entrance Stairs-Rails: Right (on the 3 stairs, easiest route in) Entrance Stairs-Number of Steps: 3-5 Alternate Level Stairs-Number of Steps: 15 Home Layout: Two level;Bed/bath upstairs Home Equipment: Shower seat - built in;Grab bars - tub/shower Additional Comments: wife has been staying with daughter and her new born in [place] recently while son-in-law is deployed; son and daughter-in-law in process of finding their own place; pt may go with wife to [place]    Prior Function Prior Level of Function : Independent/Modified Independent;Driving             Mobility Comments: No AD, no falls 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    Extremity/Trunk Assessment   Upper Extremity Assessment Upper Extremity Assessment: Defer to OT evaluation    Lower Extremity Assessment Lower Extremity Assessment: LLE deficits/detail LLE Deficits / Details: very mild incoordination noted; strength and sensation intact and symmetrical bil with MMT scores of 5 grossly    Cervical / Trunk Assessment Cervical / Trunk Assessment: Normal  Communication   Communication: Other (comment) (mild word finding difficulty)  Cognition Arousal/Alertness:  Awake/alert Behavior During Therapy: WFL for tasks assessed/performed Overall Cognitive Status: Impaired/Different from baseline Area of Impairment: Memory, Safety/judgement, Problem solving                     Memory: Decreased short-term memory   Safety/Judgement: Decreased awareness of deficits   Problem Solving: Slow processing, Requires verbal cues General Comments: Pt noted to have STM deficits, unable to recall x3 words provided a few minutes earlier. Pt also denying deficits in cognition at times, reporting it is more related to not sleeping or eating much, which could definately contribute to it but not likely the only cause. Pt able tor recall his room number when asked and then cued pt to find his room but pt not using wall signs or room numbers to find it, but rather tried to use visual recognition to figure out which room was his, looking into rooms often (primarily on his L) and passing his room (which was on his R). Needed cues to look at room number signs and then pt was able to find his way back to his room.        General Comments General comments (skin integrity, edema, etc.): extensive time for pt and wife education; educated pt and wife on his noted cognitive concerns and on "BE FAST"    Exercises     Assessment/Plan    PT Assessment Patient does not need any further PT services  PT Problem List         PT Treatment Interventions      PT Goals (Current goals can be found in the Care Plan section)  Acute Rehab PT  Goals Patient Stated Goal: to get back to his baseline PT Goal Formulation: All assessment and education complete, DC therapy Time For Goal Achievement: 08/13/22 Potential to Achieve Goals: Good    Frequency       Co-evaluation               AM-PAC PT "6 Clicks" Mobility  Outcome Measure Help needed turning from your back to your side while in a flat bed without using bedrails?: None Help needed moving from lying on your back to  sitting on the side of a flat bed without using bedrails?: None Help needed moving to and from a bed to a chair (including a wheelchair)?: None Help needed standing up from a chair using your arms (e.g., wheelchair or bedside chair)?: None Help needed to walk in hospital room?: None Help needed climbing 3-5 steps with a railing? : A Little 6 Click Score: 23    End of Session Equipment Utilized During Treatment: Gait belt Activity Tolerance: Patient tolerated treatment well Patient left: in chair;with call bell/phone within reach;with family/visitor present Nurse Communication: Mobility status PT Visit Diagnosis: Other symptoms and signs involving the nervous system (R29.898)    Time: 1112-1208 PT Time Calculation (min) (ACUTE ONLY): 56 min   Charges:   PT Evaluation $PT Eval Low Complexity: 1 Low PT Treatments $Gait Training: 8-22 mins $Therapeutic Activity: 23-37 mins        Moishe Spice, PT, DPT Acute Rehabilitation Services  Office: [phone]   Orvan Falconer 08/12/2022, 12:31 PM

## 2022-08-12 NOTE — Consult Note (Addendum)
Cardiology Consultation   Patient ID: Luke Jimenez MRN: [id number]; DOB: 25-Sep-1965  Admit date: 08/09/2022 Date of Consult: 08/12/2022  PCP:  Glenis Smoker, MD   [place] [facility] Cardiologist:  Candee Furbish, MD  Electrophysiologist:  Thompson Grayer, MD (Inactive)  {   Patient Profile:   Luke Jimenez is a 57 y.o. 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 Darrick Meigs.  History of Present Illness:   Mr. 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 Rayann Heman. However, no other known past medical history. Prior Echo in 08/2019 showed LVEF of 55-60% with mild MR. He was last seen by Beryle Beams) [place], PA-C, in 03/2021 at which time he was doing well with no cardiac complaints.  Patient presented to the ED on 08/09/2022 with stroke like symptoms (word finding difficulties and forgetfulness) that started that day after he wok up. He had a slight headache the day before but no other symptoms. Head CT showed an asymmetric hypodensity and loss of gray/ white differentiation along the medial aspect of the left temporal lobe suspicious for an acute infarct. 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 Echo today showed LVEF of 20-25% with global hypokinesis, normal RV, and mild MR. Transcranial doppler with bubble study was also performed and showed a full curtain of high-intensity transient signal observed at rest and with valsalva indication a Spencer Grade 5 PFO.   Cardiology was consulted for further evaluation of new cardiomyopathy. At the time of this evaluation, patient is resting comfortably in no acute distress. 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 He was also having some word finding difficulty. However, he denies any other symptoms. No chest pain, shortness of breath, orthopnea, PND, edema, palpitations, lightheadedness, dizziness, syncope. He has some chronic nasal congestion, especially this time of year. However, no recent fevers or illnesses. No GI symptoms. No abnormal bleeding in urine or stools.  He denies any history of tobacco use. He reports occasional alcohol use (maybe once a week).   He does have some family history of cardiovascular disease. He states his maternal grandfather died from a massive heart attack in his 16s but states he was very unhealthy. He also thinks his paternal grandfather had some heart disease but does not remember what. No known family history of stroke.   Past Medical History:  Diagnosis Date   Bradycardia    Second degree AV block     Past Surgical History:  Procedure Laterality Date   PACEMAKER IMPLANT N/A 12/20/2019   Procedure: PACEMAKER IMPLANT;  Surgeon: Thompson Grayer, MD;  Location: [facility];  Service: Cardiovascular;  Laterality: N/A;     Home Medications:  Prior to Admission medications   Medication Sig Start Date End Date Taking? Authorizing Provider  fluticasone (FLONASE) 50 MCG/ACT nasal spray Place 1 spray into both nostrils daily as needed for allergies or rhinitis. Patient not taking: Reported on 08/09/2022    [provider]  thyroid (ARMOUR) 60 MG tablet Take 60 mg by mouth daily. Patient not taking: Reported on 08/09/2022 01/29/21   [provider]    Inpatient Medications: Scheduled Meds:  aspirin EC  325 mg Oral Daily   lacosamide  200 mg Oral BID   levETIRAcetam  1,500 mg Oral BID   rosuvastatin  20 mg Oral Daily   Continuous Infusions:  PRN Meds: ondansetron **OR**  ondansetron (ZOFRAN) IV  Allergies:   No Known Allergies  Social History:   Social History   Socioeconomic History   Marital status: Married    Spouse name: Not on file   Number of children: Not on file   Years of education: Not on file   Highest education level: Not on file  Occupational History   Not on file  Tobacco Use   Smoking status: Never    Passive exposure: Never   Smokeless tobacco: Never  Vaping Use   Vaping Use: Never used  Substance and Sexual Activity   Alcohol use: Yes    Comment: 1 drink a week   Drug use: Never   Sexual activity: Not on file  Other Topics Concern   Not on file  Social History Narrative   Not on file   Social Determinants of Health   Financial Resource Strain: Not on file  Food Insecurity: No Food Insecurity (08/10/2022)   Hunger Vital Sign    Worried About Running Out of Food in the Last Year: Never true    Ran Out of Food in the Last Year: Never true  Transportation Needs: No Transportation Needs (08/10/2022)   PRAPARE - Hydrologist (Medical): No    Lack of Transportation (Non-Medical): No  Physical Activity: Not on file  Stress: Not on file  Social Connections: Not on file  Intimate Partner Violence: Not At Risk (08/10/2022)   Humiliation, Afraid, Rape, and Kick questionnaire    Fear of Current or Ex-Partner: No    Emotionally Abused: No    Physically Abused: No    Sexually Abused: No    Family History:   Family History  Problem Relation Age of Onset   Heart disease Maternal Grandfather      ROS:  Please see the history of present illness.  Review of Systems  Constitutional:  Negative for fever.  HENT:  Positive for congestion (chronic).   Respiratory:  Negative for shortness of breath.   Cardiovascular:  Negative for chest pain, palpitations, orthopnea, leg swelling and PND.  Gastrointestinal:  Negative for blood in stool, diarrhea, melena, nausea and vomiting.  Genitourinary:  Negative  for hematuria.  Musculoskeletal:  Negative for myalgias.  Neurological:  Positive for speech change (word finding difficulty). Negative for dizziness and loss of consciousness.  Endo/Heme/Allergies:  Does not bruise/bleed easily.  Psychiatric/Behavioral:  Negative for substance abuse.     Physical Exam/Data:   Vitals:   08/11/22 2341 08/12/22 0327 08/12/22 0845 08/12/22 1218  BP: 93/65 (!) 82/60 (!) 169/89 118/84  Pulse: (!) 57 93 90 (!) 106  Resp: 16 16 18 18   Temp: 98.6 F (37 C) 97.8 F (36.6 C) 99.3 F (37.4 C) 98.2 F (36.8 C)  TempSrc: Oral Oral Oral Oral  SpO2: 100% 100% 100% 98%  Weight:      Height:        Intake/Output Summary (Last 24 hours) at 08/12/2022 1815 Last data filed at 08/12/2022 1100 Gross per 24 hour  Intake 960 ml  Output 550 ml  Net 410 ml      08/10/2022    7:00 AM 04/06/2021    8:07 AM 03/23/2021   11:31 AM  Last 3 Weights  Weight (lbs) 152 lb 1.9 oz 152 lb 3.2 oz 149 lb  Weight (kg) 69 kg 69.037 kg 67.586 kg     Body mass index is 21.83 kg/m.  General: 57 y.o. thin Caucasian male resting comfortably in no acute distress. HEENT: Normocephalic and atraumatic. Sclera clear.  Neck: Supple. No carotid bruits. No JVD. Heart: Tachycardic with normal rhythm. Distinct S1 and S2. No murmurs, gallops, or rubs.  Lungs: No increased work of breathing. Clear to ausculation bilaterally. No wheezes, rhonchi, or rales.  Abdomen: Soft, non-distended, and non-tender to palpation.  MSK: Normal strength and tone for age. Extremities: No lower extremity edema.    Skin: Warm and dry. Neuro: Alert and oriented x3. No focal deficits. Psych: Normal affect. Responds appropriately.  EKG:  The EKG was personally reviewed and demonstrates:  V-paced rhythm, rate 125 bpm.   Telemetry:  Telemetry was personally reviewed and demonstrates:  V-paced rhythm with rates ranging from the 80s to 120s. Currently in the low 100s.   Relevant CV Studies:  Lower Extremity  Dopplers 08/11/2022: Summary: - No evidence of deep vein thrombosis seen in the lower extremities,  bilaterally.  - No evidence of superficial venous thrombosis in the lower extremities,  bilaterally.  -No evidence of popliteal cyst, bilaterally.  _______________  TTE 08/12/2022: Impressions:  1. Left ventricular ejection fraction, by estimation, is 20 to 25%. The  left ventricle has severely decreased function. The left ventricle  demonstrates global hypokinesis. Left ventricular diastolic function could  not be evaluated. Elevated left  ventricular end-diastolic pressure. The average left ventricular global  longitudinal strain is -7.8 %. The global longitudinal strain is abnormal.   2. Right ventricular systolic function is normal. The right ventricular  size is normal.   3. The mitral valve is normal in structure. Mild mitral valve  regurgitation. No evidence of mitral stenosis.   4. The aortic valve is normal in structure. Aortic valve regurgitation is  not visualized. No aortic stenosis is present.   5. The inferior vena cava is normal in size with greater than 50%  respiratory variability, suggesting right atrial pressure of 3 mmHg.   6. Compared to prior echo dated 09/06/19, LVF has significantly decreased  _______________  Transcranial Doppler with Bubbles 08/12/2022: Impressions: A vascular evaluation was performed. The left middle cerebral artery was  studied. An IV was inserted into the patient's right forearm. Verbal  informed consent was obtained.    A full curtain of high intensity transient signals (HITS) was observed at  rest and with valsalva, indicating a Spencer Grade 5 right to left shunt     Laboratory Data:  High Sensitivity Troponin:   Recent Labs  Lab 08/09/22 1407 08/09/22 1615  TROPONINIHS 9 11     Chemistry Recent Labs  Lab 08/09/22 1407 08/10/22 0711  NA 139 133*  K 4.3 3.5  CL 104 101  CO2 24 22  GLUCOSE 114* 93  BUN 14 12   CREATININE 1.03 0.98  CALCIUM 9.2 8.7*  GFRNONAA >60 >60  ANIONGAP 11 10    Recent Labs  Lab 08/09/22 1407 08/10/22 0711  PROT 6.9 6.4*  ALBUMIN 4.2 3.7  AST 22 21  ALT 12 11  ALKPHOS 56 51  BILITOT 0.3 1.0   Lipids  Recent Labs  Lab 08/12/22 0722  CHOL  210*  TRIG 57  HDL 50  LDLCALC 149*  CHOLHDL 4.2    Hematology Recent Labs  Lab 08/09/22 1407 08/10/22 0711  WBC 8.7 8.3  RBC 4.69 4.53  HGB 15.8 15.3  HCT 45.5 43.1  MCV 97.0 95.1  MCH 33.7 33.8  MCHC 34.7 35.5  RDW 12.7 12.6  PLT 285 240   Thyroid  Recent Labs  Lab 08/09/22 1744 08/10/22 0935  TSH 0.063*  --   FREET4  --  1.02    BNPNo results for input(s): "BNP", "PROBNP" in the last 168 hours.  DDimer No results for input(s): "DDIMER" in the last 168 hours.   Radiology/Studies:  VAS [place] TRANSCRANIAL DOPPLER W BUBBLES  Result Date: 08/12/2022  Transcranial Doppler with Bubble Patient Name:  JAGDEEP SOKOL  Date of Exam:   08/12/2022 Medical Rec #: [id number]    Accession #:    [id number] Date of Birth: [birth year]     Patient Gender: M Patient Age:   74 years Exam Location:  [facility] Procedure:      VAS [place] TRANSCRANIAL DOPPLER W BUBBLES Referring Phys: Lovey Newcomer --------------------------------------------------------------------------------  Indications: Stroke. History: Recent extended travel. Performing Technologist: Darlin Coco RDMS, RVT  Examination Guidelines: A complete evaluation includes B-mode imaging, spectral Doppler, color Doppler, and power Doppler as needed of all accessible portions of each vessel. Bilateral testing is considered an integral part of a complete examination. Limited examinations for reoccurring indications may be performed as noted.  Summary:  A vascular evaluation was performed. The left middle cerebral artery was studied. An IV was inserted into the patient's right forearm. Verbal informed consent was obtained.  A full curtain of high intensity transient signals (HITS)  was observed at rest and with valsalva, indicating a Spencer Grade 5 right to left shunt Positive TCD Bubble study at rest indicative of a large right toleft shunt *See table(s) above for TCD measurements and observations.  Diagnosing physician: Antony Contras MD Electronically signed by Antony Contras MD on 08/12/2022 at 5:08:45 PM.    Final    ECHOCARDIOGRAM COMPLETE BUBBLE STUDY  Result Date: 08/12/2022    ECHOCARDIOGRAM REPORT   Patient Name:   SHAWNA FRIEDERS Date of Exam: 08/12/2022 Medical Rec #:  [id number]   Height:       70.0 in Accession #:    [id number]  Weight:       152.1 lb Date of Birth:  28-Oct-1965    BSA:          1.858 m Patient Age:    21 years    BP:           169/89 mmHg Patient Gender: M           HR:           106 bpm. Exam Location:  Inpatient Procedure: 2D Echo, 3D Echo, Cardiac Doppler, Color Doppler, Saline Contrast            Bubble Study and Strain Analysis Indications:    Stroke  History:        Patient has prior history of Echocardiogram examinations, most                 recent 09/06/2019. Pacemaker, Arrythmias:AV Block 2nd Degree,                 Bradycardia and Tachycardia; Signs/Symptoms:Altered Mental                 Status.  Sonographer:  Eartha Inch Referring Phys: [id number] DEVON SHAFER  Sonographer Comments: Image acquisition challenging due to patient body habitus and Image acquisition challenging due to respiratory motion. Global longitudinal strain was attempted. IMPRESSIONS  1. Left ventricular ejection fraction, by estimation, is 20 to 25%. The left ventricle has severely decreased function. The left ventricle demonstrates global hypokinesis. Left ventricular diastolic function could not be evaluated. Elevated left ventricular end-diastolic pressure. The average left ventricular global longitudinal strain is -7.8 %. The global longitudinal strain is abnormal.  2. Right ventricular systolic function is normal. The right ventricular size is normal.  3. The mitral valve is  normal in structure. Mild mitral valve regurgitation. No evidence of mitral stenosis.  4. The aortic valve is normal in structure. Aortic valve regurgitation is not visualized. No aortic stenosis is present.  5. The inferior vena cava is normal in size with greater than 50% respiratory variability, suggesting right atrial pressure of 3 mmHg.  6. Compared to prior echo dated 09/06/19, LVF has significantly decreased . FINDINGS  Left Ventricle: Left ventricular ejection fraction, by estimation, is 20 to 25%. The left ventricle has severely decreased function. The left ventricle demonstrates global hypokinesis. Definity contrast agent was given IV to delineate the left ventricular endocardial borders. The average left ventricular global longitudinal strain is -7.8 %. The global longitudinal strain is abnormal. 3D ejection fraction reviewed and evaluated as part of the interpretation. Alternate measurement of EF is felt  to be most reflective of LV function. The left ventricular internal cavity size was normal in size. There is no left ventricular hypertrophy. Abnormal (paradoxical) septal motion, consistent with RV pacemaker. Left ventricular diastolic function could not be evaluated due to abnormal septal motion. Left ventricular diastolic function could not be evaluated. Elevated left ventricular end-diastolic pressure. Right Ventricle: The right ventricular size is normal. No increase in right ventricular wall thickness. Right ventricular systolic function is normal. Left Atrium: Left atrial size was normal in size. Right Atrium: Right atrial size was normal in size. Pericardium: There is no evidence of pericardial effusion. Mitral Valve: The mitral valve is normal in structure. Mild mitral valve regurgitation. No evidence of mitral valve stenosis. Tricuspid Valve: The tricuspid valve is normal in structure. Tricuspid valve regurgitation is trivial. No evidence of tricuspid stenosis. Aortic Valve: The aortic valve is  normal in structure. Aortic valve regurgitation is not visualized. No aortic stenosis is present. Pulmonic Valve: The pulmonic valve was normal in structure. Pulmonic valve regurgitation is not visualized. No evidence of pulmonic stenosis. Aorta: The aortic root is normal in size and structure. Venous: The inferior vena cava is normal in size with greater than 50% respiratory variability, suggesting right atrial pressure of 3 mmHg. IAS/Shunts: No atrial level shunt detected by color flow Doppler. Agitated saline contrast was given intravenously to evaluate for intracardiac shunting. Additional Comments: A device lead is visualized.  LEFT VENTRICLE PLAX 2D LVIDd:         5.60 cm      Diastology LVIDs:         4.90 cm      LV e' medial:    3.05 cm/s LV PW:         0.80 cm      LV E/e' medial:  26.5 LV IVS:        0.70 cm      LV e' lateral:   3.05 cm/s LVOT diam:     2.10 cm      LV E/e' lateral: 26.5  LV SV:         53 LV SV Index:   29           2D Longitudinal Strain LVOT Area:     3.46 cm     2D Strain GLS (A2C):   -10.0 %                             2D Strain GLS (A3C):   -8.7 %                             2D Strain GLS (A4C):   -4.8 % LV Volumes (MOD)            2D Strain GLS Avg:     -7.8 % LV vol d, MOD A2C: 154.0 ml LV vol d, MOD A4C: 176.0 ml LV vol s, MOD A2C: 104.0 ml LV vol s, MOD A4C: 128.0 ml 3D Volume EF: LV SV MOD A2C:     50.0 ml  3D EF:        27 % LV SV MOD A4C:     176.0 ml LV EDV:       202 ml LV SV MOD BP:      44.7 ml  LV ESV:       148 ml                             LV SV:        54 ml RIGHT VENTRICLE            IVC RV S prime:     9.25 cm/s  IVC diam: 1.30 cm LEFT ATRIUM             Index        RIGHT ATRIUM          Index LA diam:        3.70 cm 1.99 cm/m   RA Area:     8.24 cm LA Vol (A2C):   61.4 ml 33.04 ml/m  RA Volume:   14.00 ml 7.53 ml/m LA Vol (A4C):   22.6 ml 12.16 ml/m LA Biplane Vol: 37.1 ml 19.97 ml/m  AORTIC VALVE LVOT Vmax:   116.00 cm/s LVOT Vmean:  76.800 cm/s LVOT VTI:     0.153 m  AORTA Ao Root diam: 3.30 cm MITRAL VALVE MV Area (PHT): 4.93 cm       SHUNTS MV Decel Time: 154 msec       Systemic VTI:  0.15 m MR Peak grad:    56.9 mmHg    Systemic Diam: 2.10 cm MR Mean grad:    49.0 mmHg MR Vmax:         377.00 cm/s MR Vmean:        337.0 cm/s MR PISA:         0.57 cm MR PISA Eff ROA: 6 mm MR PISA Radius:  0.30 cm MV E velocity: 80.70 cm/s Fransico Him MD Electronically signed by Fransico Him MD Signature Date/Time: 08/12/2022/2:59:17 PM    Final    VAS [place] LOWER EXTREMITY VENOUS (DVT)  Result Date: 08/11/2022  Lower Venous DVT Study Patient Name:  MACHAI LAW  Date of Exam:   08/11/2022 Medical Rec #: [id number]    Accession #:    [id number] Date of Birth: 1965-05-25  Patient Gender: M Patient Age:   45 years Exam Location:  [facility] Procedure:      VAS [place] LOWER EXTREMITY VENOUS (DVT) Referring Phys: Janine Ores --------------------------------------------------------------------------------  Indications: Stroke, and Recent drive to [place].  Comparison Study: No prior study. Performing Technologist: McKayla Maag RVT, [place]  Examination Guidelines: A complete evaluation includes B-mode imaging, spectral Doppler, color Doppler, and power Doppler as needed of all accessible portions of each vessel. Bilateral testing is considered an integral part of a complete examination. Limited examinations for reoccurring indications may be performed as noted. The reflux portion of the exam is performed with the patient in reverse Trendelenburg.  +---------+---------------+---------+-----------+----------+--------------+ RIGHT    CompressibilityPhasicitySpontaneityPropertiesThrombus Aging +---------+---------------+---------+-----------+----------+--------------+ CFV      Full           Yes      Yes                                 +---------+---------------+---------+-----------+----------+--------------+ SFJ      Full                                                         +---------+---------------+---------+-----------+----------+--------------+ FV Prox  Full                                                        +---------+---------------+---------+-----------+----------+--------------+ FV Mid   Full                                                        +---------+---------------+---------+-----------+----------+--------------+ FV DistalFull                                                        +---------+---------------+---------+-----------+----------+--------------+ PFV      Full                                                        +---------+---------------+---------+-----------+----------+--------------+ POP      Full           Yes      Yes                                 +---------+---------------+---------+-----------+----------+--------------+ PTV      Full                                                        +---------+---------------+---------+-----------+----------+--------------+  PERO     Full                                                        +---------+---------------+---------+-----------+----------+--------------+   +---------+---------------+---------+-----------+----------+--------------+ LEFT     CompressibilityPhasicitySpontaneityPropertiesThrombus Aging +---------+---------------+---------+-----------+----------+--------------+ CFV      Full           Yes      Yes                                 +---------+---------------+---------+-----------+----------+--------------+ SFJ      Full                                                        +---------+---------------+---------+-----------+----------+--------------+ FV Prox  Full                                                        +---------+---------------+---------+-----------+----------+--------------+ FV Mid   Full                                                         +---------+---------------+---------+-----------+----------+--------------+ FV DistalFull                                                        +---------+---------------+---------+-----------+----------+--------------+ PFV      Full                                                        +---------+---------------+---------+-----------+----------+--------------+ POP      Full           Yes      Yes                                 +---------+---------------+---------+-----------+----------+--------------+ PTV      Full                                                        +---------+---------------+---------+-----------+----------+--------------+ PERO     Full                                                        +---------+---------------+---------+-----------+----------+--------------+  Summary: BILATERAL: - No evidence of deep vein thrombosis seen in the lower extremities, bilaterally. - No evidence of superficial venous thrombosis in the lower extremities, bilaterally. -No evidence of popliteal cyst, bilaterally.   *See table(s) above for measurements and observations. Electronically signed by Deitra Mayo MD on 08/11/2022 at 7:03:57 PM.    Final    CT ANGIO HEAD NECK W WO CM  Result Date: 08/11/2022 CLINICAL DATA:  Neuro deficit, acute, stroke suspected EXAM: CT ANGIOGRAPHY HEAD AND NECK TECHNIQUE: Multidetector CT imaging of the head and neck was performed using the standard protocol during bolus administration of intravenous contrast. Multiplanar CT image reconstructions and MIPs were obtained to evaluate the vascular anatomy. Carotid stenosis measurements (when applicable) are obtained utilizing NASCET criteria, using the distal internal carotid diameter as the denominator. RADIATION DOSE REDUCTION: This exam was performed according to the departmental dose-optimization program which includes automated exposure control, adjustment of the mA and/or kV  according to patient size and/or use of iterative reconstruction technique. CONTRAST:  70mL OMNIPAQUE IOHEXOL 350 MG/ML SOLN COMPARISON:  Same day MRI head. FINDINGS: CT HEAD FINDINGS Brain: Redemonstrated acute left PCA territory infarct, better characterized on recent MRI. Associated petechial hemorrhage. No progressive mass effect. No midline shift. No mass lesion. No hydrocephalus. Vascular: See below. Skull: No acute fracture. Sinuses/Orbits: Clear sinuses.  No acute orbital findings. Other: Left sphenoid sinus opacification. Review of the MIP images confirms the above findings CTA NECK FINDINGS Aortic arch: Great vessel origins are patent without significant stenosis. Right carotid system: No evidence of dissection, stenosis (50% or greater), or occlusion. Left carotid system: No evidence of dissection, stenosis (50% or greater), or occlusion. Vertebral arteries: Codominant. No evidence of dissection, stenosis (50% or greater), or occlusion. Skeleton: No acute abnormality limited assessment. Other neck: No acute abnormality on limited assessment. Upper chest: Biapical pleuroparenchymal scarring. No consolidation visualized lung apices. Review of the MIP images confirms the above findings CTA HEAD FINDINGS Anterior circulation: Bilateral intracranial ICAs, MCAs, and ACAs are patent without proximal hemodynamically significant stenosis. Posterior circulation: Bilateral intradural vertebral arteries, basilar artery and Venous sinuses: As permitted by contrast timing, patent. Review of the MIP images confirms the above findings IMPRESSION: 1. Redemonstrated acute left PCA territory infarct with associated petechial hemorrhage. No progressive mass effect. 2. No large vessel occlusion or proximal high-grade stenosis. Electronically Signed   By: Margaretha Sheffield M.D.   On: 08/11/2022 15:54   MR BRAIN W WO CONTRAST  Result Date: 08/11/2022 CLINICAL DATA:  Mental status change, unknown cause EXAM: MRI HEAD WITHOUT  AND WITH CONTRAST TECHNIQUE: Multiplanar, multiecho pulse sequences of the brain and surrounding structures were obtained without and with intravenous contrast. CONTRAST:  12mL GADAVIST GADOBUTROL 1 MMOL/ML IV SOLN COMPARISON:  CT head March 25 24. FINDINGS: Brain: Acute infarcts involving the left thalamus, left hippocampus, medial left temporal lobe and left occipital lobe, compatible with acute left PCA territory infarcts. Associated edema without significant mass effect. No midline shift. Petechial hemorrhage without mass occupying hemorrhage. No hydrocephalus. Vascular: Major arterial flow voids are maintained at the skull base. Skull and upper cervical spine: Normal marrow signal. Sinuses/Orbits: Largely clear sinuses.  No acute orbital findings. Other: No mastoid effusions. IMPRESSION: Acute left PCA territory infarcts, detailed above. Electronically Signed   By: Margaretha Sheffield M.D.   On: 08/11/2022 09:56   Overnight EEG with video  Result Date: 08/10/2022 Lora Havens, MD     08/11/2022  9:09 AM Patient Name: Zigmunt Caughell MRN: [id number]  Epilepsy Attending: Lora Havens Referring Physician/Provider: Katy Apo, NP Duration: 08/09/2022 2224 to 08/10/2022 2224 Patient history: 57 y.o. male, with history of second-degree AV block with pacemaker, presented to ED with complaints of nonspecific complaints of feeling off this morning.  Patient family was concerned that he was having difficulty with speaking over the phone.  Also was having difficulty finding words with forgetfulness. EEG to evaluate for seizure Level of alertness: Awake, asleep AEDs during EEG study: None Technical aspects: This EEG study was done with scalp electrodes positioned according to the 10-20 International system of electrode placement. Electrical activity was reviewed with band pass filter of 1-70Hz , sensitivity of 7 uV/mm, display speed of 57mm/sec with a 60Hz  notched filter applied as appropriate. EEG data were  recorded continuously and digitally stored.  Video monitoring was available and reviewed as appropriate. Description: The posterior dominant rhythm consists of 9-10 Hz activity of moderate voltage (25-35 uV) seen predominantly in posterior head regions, symmetric and reactive to eye opening and eye closing. Sleep was characterized by vertex waves, sleep spindles (12 to 14 Hz), maximal frontocentral region. EEG showed continuous 3 to 6 Hz theta-delta slowing in left temporal region. Hyperventilation and photic stimulation were not performed.   Seizure without clinical signs were noted arising from left temporal region. During seizure, EEG showed 45 Hz theta slowing in left frontotemporal region which then involved all of left hemisphere and eventually right hemisphere and then evolved into 2 to 3 Hz delta slowing.  Seizures were noted on 08/10/2022 at [place], [place], [place], [place], [place], [place], [place], [place], [place], [place], [place], [id number], [place], [place], [place], [place], 1743, 1853, 2012, 2157. Average duration of seizures was about 5 minutes. ABNORMALITY - Seizure without clinical signs, left temporal region - Continuous slow, left temporal region IMPRESSION: This study showed seizure without clinical signs were noted arising from left temporal region as described above, average duration about 5 minutes. Last seizure was noted on 08/10/2022 at 2157. Additionally there is cortical dysfunction arising from left temporal region likely secondary to underlying structural abnormality. Lora Havens   CT Head Wo Contrast  Result Date: 08/09/2022 CLINICAL DATA:  Altered mental status EXAM: CT HEAD WITHOUT CONTRAST TECHNIQUE: Contiguous axial images were obtained from the base of the skull through the vertex without intravenous contrast. RADIATION DOSE REDUCTION: This exam was performed according to the departmental dose-optimization program which includes automated exposure control, adjustment of the mA and/or kV according to patient size  and/or use of iterative reconstruction technique. COMPARISON:  None Available. FINDINGS: Brain: There is asymmetric hypodensity along the medial aspect of the left temporal lobe (series 2, image 12), which may be artifactual, but is suspicious for an acute infarct. Recommend further evaluation with brain MRI no evidence of hemorrhage, hydrocephalus, extra-axial collection or mass lesion/mass effect. Vascular: No disproportionately hyperdense vessel or unexpected calcification. Skull: Normal. Negative for fracture or focal lesion. Sinuses/Orbits: No middle ear or mastoid effusion. Mucosal thickening left maxillary sinus. Orbits are unremarkable Other: None. IMPRESSION: Asymmetric hypodensity and loss of gray/white differentiation along the medial aspect of the left temporal lobe may be artifactual, but is suspicious for an acute infarct. Recommend further evaluation with brain MRI. Electronically Signed   By: Marin Roberts M.D.   On: 08/09/2022 15:11     Assessment and Plan:   New Cardiomyopathy - Etiology Unclear Patient was admitted for acute stroke and found to have newly reduced EF. Echo showed LVEF of 20-25% with global hypokinesis, normal  RV, and mild MR. EF down from 55-60% in 2021. He has no signs or symptoms of CHF. Etiology unclear. He denies any anginal symptoms. No significant alcohol use. No drug use. TSH low at 0.063 but free T4 and T3 normal. HIV negative. No recent viral illnesses.  - Euvolemic on exam. - Will plan to start GDMT once okay from Neurology from a permissive hypertensive standpoint (I sent Dr. Luevenia Maxin a message asking about this). BP is on the start side so will likely need to start with low dose Losartan, Toprol vs Coreg, and/ or Spironolactone. Will ask Case Manager to run the cost of Jardiance and Wilder Glade to see which one will be more affordable for the patient.  - Will ultimately need an ischemic evaluation to rule out ischemia as a cause. We could do this with a coronary  CTA or cardiac catheterization. He is getting a chest CTA to rule out PE this evening - we can wait to see if there is any significant coronary calcifications on this to help decide on which method. I don't think it necessary as to be done as an inpatient; however, will discuss with MD.   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 EKG and telemetry showed v-paced rhythm with elevated rates as high as the 120s. Rate currently in the low 100s. Device interrogation on 3/27 showed an atrial arrhythmia with rates around 130 bpm and underlying 2:1 AV block. This was reviewed with EP APP who stated it looked like sinus tachycardia and atrial tachycardia but no atrial fibrillation.  - Will plan to start beta-blocker when okay with Neurology given recent stroke and need for permissive hypertension.  PFO Transcranial dopplers with bubble study this admission suggestive of Spencer Grade 5 PFO with right to left shunt. Lower extremity dopplers negative for DVT. Chest CTA pending to rule out PE. - Plan is for TEE tomorrow. Patient has already been consented for this.   Hyperlipidemia Lipid panel this admission: Total Cholesterol 210, Triglycerides 57, HDL 50, LDL 149. LDL goal <70 given stroke. - Started on Crestor 20mg  daily today. - Will need repeat lipid panel and LFTs in 6-8 weeks.  Acute Stroke Admitted with acute left PCA territory infarcts. Transcranial dopplers with bubble study this admission suggestive of Spencer Grade 5 PFO with right to left shunt. No evidence of atrial fibrillation on device interrogation this admission. Lower extremity dopplers negative for DVT.  - Currently on Aspirin 325mg  daily. - Started on Crestor 20mg  daily today.  - Plan is for TEE tomorrow.  - Otherwise, per Neurology.    Risk Assessment/Risk Scores:    New York Heart Association (NYHA) Functional Class NYHA Class I   For questions or  updates, please contact [place] Please consult [email or web address] for contact info under    Signed, Darreld Mclean, PA-C  08/12/2022 6:15 PM    History and all data above reviewed.  Patient examined.  I agree with the findings as above.  The patient has a history of CHB.  He has no other cardiac history except PFO possible on 2021 echo.  EF at that time was normal.  He had been feeling well.  He walks the dog and thinks he has had no recent symptoms.  The patient denies any new symptoms such as chest discomfort, neck or arm discomfort. There has been no new shortness of breath, PND or orthopnea. There have been  no reported palpitations, presyncope or syncope  The patient exam reveals COR:RRR, positive S3, no JVD  ,  Lungs: Clear  ,  Abd: Positive bowel sounds, no rebound no guarding, Ext No edema  .  All available labs, radiology testing, previous records reviewed. Agree with documented assessment and plan. PFO:  We will further characterize the PFO with TEE to add to the risk profile for future events.  He has a high RoPE score and his young age and probable embolic event would lean toward eventual closure.   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  Hypercoag work up is pending.   CM:  The patient has been active and asymptomatic.  He had a normal EF in 2021.  This could be related to a hyperadrenergic event follow stroke.  I think it is less likely that this is ischemic but we will likely plan coronary CT as an out patient.  We can also consider MRI to exclude infiltrate cardiomyopathy that could also be related to complete heart block.  Med titration will follow an appropriate period of permissive norotension or hypertension.    Jeneen Rinks Angelin Cutrone  8:54 PM  08/12/2022

## 2022-08-12 NOTE — Progress Notes (Addendum)
STROKE TEAM PROGRESS NOTE   SUBJECTIVE (INTERVAL HISTORY) His family is at the bedside.   Patient evaluated laying on the bleeding bed. He denies having any neurodeficits except difficulty remembering how to fix his computer He denied any trouble with ambulation He was informed by his daughter to come to the hospital because he was having difficulty speaking over the phone with some memory loss. Reports having some blurry vision yesterday with vision back to normal today Discussed plan for TTE TTE dn TCD bubble study and further evaluation for the cause of his stroke   OBJECTIVE Vitals:   08/11/22 2341 08/12/22 0327 08/12/22 0845 08/12/22 1218  BP: 93/65 (!) 82/60 (!) 169/89 118/84  Pulse: (!) 57 93 90 (!) 106  Resp: 16 16 18 18   Temp: 98.6 F (37 C) 97.8 F (36.6 C) 99.3 F (37.4 C) 98.2 F (36.8 C)  TempSrc: Oral Oral Oral Oral  SpO2: 100% 100% 100% 98%  Weight:      Height:        CBC:  Recent Labs  Lab 08/09/22 1407 08/10/22 0711  WBC 8.7 8.3  HGB 15.8 15.3  HCT 45.5 43.1  MCV 97.0 95.1  PLT 285 [id number]    Basic Metabolic Panel:  Recent Labs  Lab 08/09/22 1407 08/10/22 0711  NA 139 133*  K 4.3 3.5  CL 104 101  CO2 24 22  GLUCOSE 114* 93  BUN 14 12  CREATININE 1.03 0.98  CALCIUM 9.2 8.7*    Lipid Panel:  Recent Labs  Lab 08/12/22 0722  CHOL 210*  TRIG 57  HDL 50  CHOLHDL 4.2  VLDL 11  LDLCALC 149*   HgbA1c:  Lab Results  Component Value Date   HGBA1C 5.4 08/10/2022   Urine Drug Screen:     Component Value Date/Time   LABOPIA NONE DETECTED 08/10/2022 0126   COCAINSCRNUR NONE DETECTED 08/10/2022 0126   LABBENZ NONE DETECTED 08/10/2022 0126   AMPHETMU NONE DETECTED 08/10/2022 0126   THCU NONE DETECTED 08/10/2022 0126   LABBARB NONE DETECTED 08/10/2022 0126    Alcohol Level     Component Value Date/Time   ETH <10 08/09/2022 1407    IMAGING  Results for orders placed or performed during the hospital encounter of 08/09/22  MR BRAIN W  WO CONTRAST   Narrative   CLINICAL DATA:  Mental status change, unknown cause  EXAM: MRI HEAD WITHOUT AND WITH CONTRAST  TECHNIQUE: Multiplanar, multiecho pulse sequences of the brain and surrounding structures were obtained without and with intravenous contrast.  CONTRAST:  59mL GADAVIST GADOBUTROL 1 MMOL/ML IV SOLN  COMPARISON:  CT head March 25 24.  FINDINGS: Brain: Acute infarcts involving the left thalamus, left hippocampus, medial left temporal lobe and left occipital lobe, compatible with acute left PCA territory infarcts. Associated edema without significant mass effect. No midline shift. Petechial hemorrhage without mass occupying hemorrhage. No hydrocephalus.  Vascular: Major arterial flow voids are maintained at the skull base.  Skull and upper cervical spine: Normal marrow signal.  Sinuses/Orbits: Largely clear sinuses.  No acute orbital findings.  Other: No mastoid effusions.  IMPRESSION: Acute left PCA territory infarcts, detailed above.   Electronically Signed   By: Margaretha Sheffield M.D.   On: 08/11/2022 09:56   CT Head Wo Contrast   Narrative   CLINICAL DATA:  Altered mental status  EXAM: CT HEAD WITHOUT CONTRAST  TECHNIQUE: Contiguous axial images were obtained from the base of the skull through the vertex without intravenous contrast.  RADIATION DOSE REDUCTION: This exam was performed according to the departmental dose-optimization program which includes automated exposure control, adjustment of the mA and/or kV according to patient size and/or use of iterative reconstruction technique.  COMPARISON:  None Available.  FINDINGS: Brain: There is asymmetric hypodensity along the medial aspect of the left temporal lobe (series 2, image 12), which may be artifactual, but is suspicious for an acute infarct. Recommend further evaluation with brain MRI no evidence of hemorrhage, hydrocephalus, extra-axial collection or mass lesion/mass  effect.  Vascular: No disproportionately hyperdense vessel or unexpected calcification.  Skull: Normal. Negative for fracture or focal lesion.  Sinuses/Orbits: No middle ear or mastoid effusion. Mucosal thickening left maxillary sinus. Orbits are unremarkable  Other: None.  IMPRESSION: Asymmetric hypodensity and loss of gray/white differentiation along the medial aspect of the left temporal lobe may be artifactual, but is suspicious for an acute infarct. Recommend further evaluation with brain MRI.   Electronically Signed   By: Marin Roberts M.D.   On: 08/09/2022 15:11     PHYSICAL EXAM  General: Well-appearing young Caucasian male. No acute distress. HEENT: [place]/AT. Anicteric sclera. Moist mucous membrane. Respiratory: Clear to auscultation bilaterally. Cardiovascular: Mild tachycardia. Ventricularly paced rhythm. No LE edema Extremities: Warm and well-perfused.  Neuro: Alert and oriented x 3. Speech is fluent and comprehensible. No dysarthria or aphasia. He has word finding difficulty as well as short-term memory loss. Dimninshed recall 0/3. Able to name only 6 animals which can walk on 4 legs.Normal peripheral vision. Normal finger-nose testing. Strength 5/5 in all extremities. Normal sensation to light touch.  ASSESSMENT/PLAN Mr. Luke Jimenez is a 57 y.o. 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 Bedside bubble study concerning for possible shunt.  Will further evaluate with TEE and CTA to delineate cardiac shunt versus pulmonary shunt.    Stroke: Acute left PCA territory infarct embolic secondary to PFO given recent long drive from [place] day before onset of symptoms. CT head Asymmetric hypodensity and loss of gray/white differentiation along the medial aspect of the left temporal lobe may be  artifactual, but is suspicious for an acute infarct. MRI head: Acute left PCA territory infarct  CT head and neck: Redemonstrated acute left PCA territory infarct with associated petechial hemorrhage but no progressive mass effect and no LVO 2D Echo: EF 20-25%, global hypokinesis but no valvular abnormalities. Transcranial Doppler: A full curtain of high intensity transient signals (HITS) was observed at rest and with valsalva, indicating a Spencer Grade 5 patent foramen ovale (PFO)  Pending TEE tomorrow Pending CTA chest LDL 149 HgbA1c pending Follow-up hypercoagulable workup SCD for VTE prophylaxis Diet Order             Diet Heart Room service appropriate? Yes; Fluid consistency: Thin  Diet effective now                  No antithrombotic prior to admission, Will likely require DAPT Ongoing aggressive stroke risk factor management Therapy recommendations: Pending Disposition: Pending  Seizures EEG overnight 08/09/22 - 08/10/22:"ABNORMALITY- Seizure without clinical signs, left temporal region. Continuous slow, left temporal region Loaded with Keppra load 3000 mg IV and Vimpat 400 mg IV on 3/27 Continue on Keppra 1500 mg twice daily Continue Vimpat 200 mg twice daily As needed IV Ativan for seizure activity lasting >3 min  Seizure precautions  HFrEF TTE today shows EF 20-25% with global hypokinesis No signs of heart failure exacerbation on exam Cardiology consulted for evaluation, appreciate recs  Second-degree AVB s/p PPM in 2021 Pacemaker interrogated on 3/27 in pre-MRI note that patient was in atrial arrhythmia ~130s underlying 2:1 AVB.  Mostly sinus tach and atrial tachycardia but no A-fib Continue telemetry  Hypertension Stable with SBP in the 100s to 120s.  Permissive hypertension (OK if < 220/120) but gradually normalize in 5-7 days  Long-term BP goal normotensive  Hyperlipidemia Home meds: None LDL 149, goal < 70 Add Crestor 20 mg daily Continue statin at  discharge  Diabetes type II HgbA1c pending, goal < 7.0  Other Active Problems Hypothyroidism: Per primary team  Hospital day # 2   To contact Stroke Continuity provider, please refer to [email or web address] After hours, contact General Neurology  Lacinda Axon, MD 08/12/2022, 5:17 PM IM Resident, PGY-3 Oswaldo Milian 41:10 I have personally obtained history,examined this patient, reviewed notes, independently viewed imaging studies, participated in medical decision making and plan of care.ROS completed by me personally and pertinent positives fully documented  I have made any additions or clarifications directly to the above note. Agree with note above.  Patient presented with sudden onset of speech and word finding difficulties and forgetfulness and MRI shows left PCA infarct.  Neurovascular imaging is unremarkable.  TCD bubble study performed at the bedside shows a large right to left shunt raising possibility of paradoxical embolism given the fact that patient had a long drive from [place] to [place] the day before the symptoms.  Recommend check echocardiogram and TEE and interrogate pacemaker for A-fib.  Recommend dual antiplatelet therapy for now and aggressive risk factor modification.  Add statin 20 mg daily check lab work for hypercoagulability and vasculitis.  Long discussion patient and wife at the bedside and answered questions.  Greater than 50% time during this 50-minute visit were spent in counseling and coordination of care about his cryptogenic stroke and discussion about evaluation and treatment and answering questions.  Antony Contras, MD Medical Director [facility] Pager: [phone] 08/12/2022 6:16 PM

## 2022-08-12 NOTE — Progress Notes (Signed)
PT has been DC. No skin breakdown at all skin sites, [facility] has been notified. DC per Subramanam.

## 2022-08-12 NOTE — Progress Notes (Signed)
Occupational Therapy Treatment Patient Details Name: Luke Jimenez MRN: [id number] DOB: 05/29/1965 Today's Date: 08/12/2022   History of present illness Pt is a 57 y.o. male who presented 08/09/22 with speech difficulty and AMS. 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 EEG also showing seizures. PMH: second-degree AV block with pacemaker, bradycardia   OT comments  Per evaluating OT, further functional cognitive assessment indicated and beneficial while pt's spouse also present. Focused on pill box test administration with pt initially doing fairly well with increased time though anticipate brain fatigue led to errors later in the test resulting in fail of this assessment. Provided education on IADL precautions and where supervision assist may be needed while also allowing pt to attempt tasks as independently as possible. Both receptive of education and wife plans to provide the needed assist at DC.   Recommendations for follow up therapy are one component of a multi-disciplinary discharge planning process, led by the attending physician.  Recommendations may be updated based on patient status, additional functional criteria and insurance authorization.    Assistance Recommended at Discharge Frequent or constant Supervision/Assistance  Patient can return home with the following  Direct supervision/assist for financial management;Direct supervision/assist for medications management;Assistance with cooking/housework;Assist for transportation   Equipment Recommendations  None recommended by OT    Recommendations for Other Services      Precautions / Restrictions Precautions Precautions: None Restrictions Weight Bearing Restrictions: No       Mobility Bed Mobility                    Transfers                         Balance                                           ADL either performed or assessed  with clinical judgement   ADL Overall ADL's : Needs assistance/impaired     Grooming: Supervision/safety;Standing           Upper Body Dressing : Supervision/safety   Lower Body Dressing: Supervision/safety   Toilet Transfer: Supervision/safety           Functional mobility during ADLs: Supervision/safety General ADL Comments: Focus on pill box test (see cognition section) with active discussion afterwards on errors, likely brain fatigue s/p CVA and wife assist with IADLs - both pt and wife involved in education    Extremity/Trunk Assessment Upper Extremity Assessment Upper Extremity Assessment: Overall WFL for tasks assessed   Lower Extremity Assessment Lower Extremity Assessment: Defer to PT evaluation LLE Deficits / Details: very mild incoordination noted; strength and sensation intact and symmetrical bil with MMT scores of 5 grossly   Cervical / Trunk Assessment Cervical / Trunk Assessment: Normal    Vision Baseline Vision/History: 1 Wears glasses (reading) Ability to See in Adequate Light: 0 Adequate Patient Visual Report: Other (comment) (mild blurriness in part of R vision only) Vision Assessment?: Yes Eye Alignment: Within Functional Limits Ocular Range of Motion: Within Functional Limits Alignment/Gaze Preference: Within Defined Limits Tracking/Visual Pursuits: Able to track stimulus in all quads without difficulty Saccades: Within functional limits Convergence: Within functional limits Visual Fields: No apparent deficits Additional Comments: vision appears WFL, some R inattention possibly   Perception     Praxis  Cognition Arousal/Alertness: Awake/alert Behavior During Therapy: WFL for tasks assessed/performed Overall Cognitive Status: Impaired/Different from baseline Area of Impairment: Memory, Safety/judgement, Problem solving, Attention, Awareness                   Current Attention Level: Sustained Memory: Decreased short-term  memory   Safety/Judgement: Decreased awareness of deficits, Decreased awareness of safety Awareness: Emergent Problem Solving: Slow processing, Requires verbal cues, Difficulty sequencing General Comments: showing some awareness into memory, attention and problem solving deficits. administered pill box test w/ pt initially doing well with increased time to read and process label instructions. However, pt forgot to go back to place one medication and then misinterpreted 3x/day as 3x/wk. Pt also took > 5 min to complete test resulting in failing test.        Exercises      Shoulder Instructions       General Comments educated pt and spouse on recommendations to have assist for med mgmt, finances, transportation and cooking    Pertinent Vitals/ Pain       Pain Assessment Pain Assessment: No/denies pain  Home Living Family/patient expects to be discharged to:: Private residence Living Arrangements: Children;Spouse/significant other (son and daughter-in-law) Available Help at Discharge: Family;Available PRN/intermittently (daughter-in-law has more flexibilty to assist while son is at work and wife is in [place]) Type of Home: House Home Access: Stairs to enter Technical brewer of Steps: 3-5 Entrance Stairs-Rails: Right (on the 3 stairs, easiest route in) Home Layout: Two level;Bed/bath upstairs Alternate Level Stairs-Number of Steps: 15 Alternate Level Stairs-Rails: Left (ascending) Bathroom Shower/Tub: Occupational psychologist: Standard Bathroom Accessibility: Yes   Home Equipment: Shower seat - built in;Grab bars - tub/shower   Additional Comments: wife has been staying with daughter and her new born in [place] recently while son-in-law is deployed; son and daughter-in-law in process of finding their own place; pt may go with wife to [place]  Lives With: Spouse    Prior Functioning/Environment              Frequency  Min 2X/week        Progress  Toward Goals  OT Goals(current goals can now be found in the care plan section)  Progress towards OT goals: Progressing toward goals  Acute Rehab OT Goals Patient Stated Goal: home soon OT Goal Formulation: With patient Time For Goal Achievement: 08/26/22 Potential to Achieve Goals: Good ADL Goals Additional ADL Goal #1: Pt will complete pill box test with <3 errors. Additional ADL Goal #2: Pt will recall and complete 4 step trail making task in distracting environment with supervision using compensatory techniques as needed for recall. Additional ADL Goal #3: Pt will demonstrate consistent emergent awareness to deficits and safety concerns.  Plan Discharge plan remains appropriate    Co-evaluation                 AM-PAC OT "6 Clicks" Daily Activity     Outcome Measure   Help from another person eating meals?: None Help from another person taking care of personal grooming?: None Help from another person toileting, which includes using toliet, bedpan, or urinal?: A Little Help from another person bathing (including washing, rinsing, drying)?: A Little Help from another person to put on and taking off regular upper body clothing?: A Little Help from another person to put on and taking off regular lower body clothing?: A Little 6 Click Score: 20    End of Session    OT Visit  Diagnosis: Other symptoms and signs involving cognitive function;Cognitive communication deficit (R41.841) Symptoms and signs involving cognitive functions: Cerebral infarction   Activity Tolerance Patient tolerated treatment well   Patient Left in bed;with call bell/phone within reach;with family/visitor present   Nurse Communication Mobility status        Time: 1340-1408 OT Time Calculation (min): 28 min  Charges: OT General Charges $OT Visit: 1 Visit OT Evaluation $OT Eval Moderate Complexity: 1 Mod OT Treatments $Self Care/Home Management : 23-37 mins  Malachy Chamber, OTR/L Acute Rehab  Services Office: [phone]   Layla Maw 08/12/2022, 2:19 PM

## 2022-08-12 NOTE — Evaluation (Signed)
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 male who presented to ED with complaints of nonspecific complaints of feeling off this morning. Pt family concerned that he was having difficulty with speaking over the phone.  Also was having difficulty finding words with forgetfulness.  Pt had slight headache yesterday but no other symptoms.  CT head (08/09/22) showed hypodensity in the left temporal lobe suspicious for acute infarct.  MRI brain (08/11/22) revealed "Acute left PCA territory infarcts". EEG (08/12/22) "suggestive of cortical dysfunction arising from left temporal region likely secondary to underlying stroke, post-ictal state. No further seizures were noted". PMH: second-degree AV block with pacemaker.   Assessment / Plan / Recommendation Clinical Impression  Pt presents with primarily cognitive communication deficits, with very few word finding difficulites and likely visual recognition deficits. He is oriented fully to self and place with difficulty recalling year (1994) and reason for hospitalization. Immediate recall of listed items and perfomance with simple mathematical  calculation were [facility]. Cognitive deificts noted were in the areas of delayed recall (2/5 items recalled), complex verbal problem solving and executive function (inferencing, self-monitoring/correcting). During informal conversation, no word finding or expressive language difficulties noted. Perseveration/hesistation exhibited during confrontational naming task in x1 instance. Receptive language appeared [facility] for tasks provided. During reading (words/sentences) and picture scene task, pt with difficulty recognizing/discriminating few pictured objects/words. Some hesitations evident during reading task and pt reports no issues with visual acuity, but with some delay in recognizing word. Would need to r/o any other visual deficits to determine if solely a recognition/discrimination issue. Recommend SLP services f/u acutely to treat primarily for cognitive communication function. Ongoing assessment/ dx treatment for language function also warranted. Wife present in room/via phone for majority of session is in agreement with recommendations.    SLP Assessment  SLP Recommendation/Assessment: Patient needs continued Speech [place] [facility] SLP Visit Diagnosis: Cognitive communication deficit (R41.841)    Recommendations for follow up therapy are one component of a multi-disciplinary discharge planning process, led by the attending physician.  Recommendations may be updated based on patient status, additional functional criteria and insurance authorization.    Follow Up Recommendations  Follow physician's recommendations for discharge plan and follow up therapies    Assistance Recommended at Discharge  Intermittent Supervision/Assistance  Functional Status Assessment Patient has had a recent decline in their functional status and demonstrates the ability to make significant improvements in function in a reasonable and predictable amount of time.  Frequency and Duration min 2x/week  2 weeks       SLP Evaluation Cognition  Overall Cognitive Status: Impaired/Different from baseline Arousal/Alertness: Awake/alert Orientation Level: Oriented to person;Oriented to place;Disoriented to situation;Disoriented to time Year:  (  1994) Month: March Day of Week: Correct Attention: Sustained Sustained Attention: Impaired Sustained Attention Impairment: Verbal basic Memory: Impaired Memory Impairment: Storage deficit;Retrieval deficit;Decreased recall of new information Awareness: Impaired Awareness Impairment: Intellectual impairment Problem Solving: Impaired Problem Solving Impairment: Verbal complex Executive Function: Reasoning;Self Monitoring;Self Correcting Reasoning: Impaired Reasoning Impairment: Verbal complex Self Monitoring: Impaired Self Monitoring Impairment: Verbal complex Self Correcting: Impaired Self Correcting Impairment: Verbal complex Safety/Judgment: Impaired       Comprehension  Auditory Comprehension Overall Auditory Comprehension: Appears within functional limits for tasks assessed Yes/No Questions: Within Functional Limits Commands: Within Functional Limits Conversation: Complex Visual Recognition/Discrimination Discrimination: Exceptions to [facility] Pictures: Able for objects in room Other Visual Recogniton/Discrimination Comments: difficulty recognizing words during word/sentence level reading task, difficulty recognizing few objects in picture scene Reading Comprehension Reading Status: Not tested    Expression Expression Primary Mode of Expression: Verbal Verbal Expression Overall Verbal Expression: Impaired Initiation: No impairment Automatic Speech: Name;Social Response Level of Generative/Spontaneous Verbalization: Conversation Repetition: No impairment Naming: Impairment Responsive: 76-100% accurate Confrontation: Impaired Pictures: Able for objects in room Convergent: 75-100% accurate Verbal Errors: Perseveration Written  Expression Dominant Hand: Right   Oral / Motor  Oral Motor/Sensory Function Overall Oral Motor/Sensory Function: Within functional limits Motor Speech Overall Motor Speech: Appears within functional limits for tasks assessed             Ellwood Dense, [place], [place] Office Number: [phone]  Acie Fredrickson 08/12/2022, 11:39 AM

## 2022-08-12 NOTE — Procedures (Addendum)
Patient Name: Luke Jimenez  MRN: [id number]  Epilepsy Attending: Lora Havens  Referring Physician/Provider: Katy Apo, NP  Duration: 08/12/2022 0600 to 08/12/2022 1000   Patient history: 57 y.o. male, with history of second-degree AV block with pacemaker, presented to ED with complaints of nonspecific complaints of feeling off this morning.  Patient family was concerned that he was having difficulty with speaking over the phone.  Also was having difficulty finding words with forgetfulness. EEG to evaluate for seizure   Level of alertness: Awake, asleep   AEDs during EEG study: LEV, LCM   Technical aspects: This EEG study was done with scalp electrodes positioned according to the 10-20 International system of electrode placement. Electrical activity was reviewed with band pass filter of 1-70Hz , sensitivity of 7 uV/mm, display speed of 62mm/sec with a 60Hz  notched filter applied as appropriate. EEG data were recorded continuously and digitally stored.  Video monitoring was available and reviewed as appropriate.   Description: The posterior dominant rhythm consists of 9-10 Hz activity of moderate voltage (25-35 uV) seen predominantly in posterior head regions, symmetric and reactive to eye opening and eye closing. Sleep was characterized by vertex waves, sleep spindles (12 to 14 Hz), maximal frontocentral region. EEG showed intermittent 3 to 6 Hz theta-delta slowing in left temporal region. Hyperventilation and photic stimulation were not performed.       ABNORMALITY -Intermittent slow, left temporal region   IMPRESSION: This study is suggestive of cortical dysfunction arising from left temporal region likely secondary to underlying stroke, post-ictal state. No further seizures were noted.   Luke Jimenez [name]

## 2022-08-12 NOTE — Progress Notes (Addendum)
[facility]  PROGRESS NOTE  Luke Jimenez [id number] DOB: [birth year] DOA: 08/09/2022 PCP: Luke Smoker, MD   Brief HPI:    57 y.o. male, with history of second-degree AV block with pacemaker, presented to ED with complaints of nonspecific complaints of feeling off this morning.  Patient family was concerned that he was having difficulty with speaking over the phone.  Also was having difficulty finding words with forgetfulness.  Patient had slight headache yesterday but no other symptoms.  CT head showed hypodensity in the left temporal lobe suspicious for acute infarct.  MRI could not be obtained as patient has pacemaker, MRI will be obtained in a.m. neurology was consulted, recommended overnight EEG to rule out seizure.     Subjective   Patient seen and examined, LTM EEG has been discontinued per neurology.   Assessment/Plan:     Acute PCA territory infarct -MRI brain shows acute infarct involving PCA territory -Patient earlier presented with altered mental status, CT head showed hypodensity in the left temporal lobe suspicious for acute infarct -MRI with and without contrast ordered on admission,  not done on day of admission as patient has pacemaker -Stroke workup started per neurology, patient may need TEE in a.m. -Will check hemoglobin A1c, fasting lipid profile  Seizure -Continuous EEG monitoring showed evidence of left temporal seizures  -started on Keppra per neurology -Neurology following  Hypothyroidism -Patient takes thyroid Armour at home -TSH low at 0.063 - T3 119 and free T4 is 1.02 -Confirmed with patient's wife, patient was taking thyroid Armour 60 mg at home    Medications      stroke: early stages of recovery book   Does not apply Once   aspirin EC  325 mg Oral Daily   lacosamide  200 mg Oral BID   levETIRAcetam  1,500 mg Oral BID     Data Reviewed:   CBG:  Recent Labs  Lab 08/11/22 0608 08/11/22 1217 08/11/22 1801  08/11/22 2257 08/12/22 0615  GLUCAP 97 89 101* 88 85    SpO2: 100 %    Vitals:   08/11/22 1540 08/11/22 1947 08/11/22 2341 08/12/22 0327  BP: 117/88 110/73 93/65 (!) 82/60  Pulse: (!) 102 98 (!) 57 93  Resp: 18 18 16 16   Temp: 98.4 F (36.9 C) 98.9 F (37.2 C) 98.6 F (37 C) 97.8 F (36.6 C)  TempSrc: Oral Oral Oral Oral  SpO2: 99% 99% 100% 100%  Weight:      Height:          Data Reviewed:  Basic Metabolic Panel: Recent Labs  Lab 08/09/22 1407 08/10/22 0711  NA 139 133*  K 4.3 3.5  CL 104 101  CO2 24 22  GLUCOSE 114* 93  BUN 14 12  CREATININE 1.03 0.98  CALCIUM 9.2 8.7*    CBC: Recent Labs  Lab 08/09/22 1407 08/10/22 0711  WBC 8.7 8.3  HGB 15.8 15.3  HCT 45.5 43.1  MCV 97.0 95.1  PLT 285 240    LFT Recent Labs  Lab 08/09/22 1407 08/10/22 0711  AST 22 21  ALT 12 11  ALKPHOS 56 51  BILITOT 0.3 1.0  PROT 6.9 6.4*  ALBUMIN 4.2 3.7     Antibiotics: Anti-infectives (From admission, onward)    None        DVT prophylaxis: SCDs  Code Status: Full code  Family Communication: Discussed with patient's wife on phone on 08/09/2022   CONSULTS neurology   Objective    Physical Examination:  General-appears in no acute distress Heart-S1-S2, regular, no murmur auscultated Lungs-clear to auscultation bilaterally, no wheezing or crackles auscultated Abdomen-soft, nontender, no organomegaly Extremities-no edema in the lower extremities Neuro-alert, oriented x3, no focal deficit noted   Status is: Inpatient:             [name]   [facility] If 7PM-7AM, please contact night-coverage at [email or web address], Office  [phone]   08/12/2022, 8:29 AM  LOS: 2 days

## 2022-08-12 NOTE — Progress Notes (Signed)
TCD w/ bubble study completed.   Please see CV Proc for preliminary results.   Vegas Fritze, RDMS, RVT  

## 2022-08-12 NOTE — Progress Notes (Addendum)
   Luke Jimenez has been requested to perform a transesophageal echocardiogram on Luke Jimenez for stroke.  After careful review of history and examination, the risks and benefits of transesophageal echocardiogram have been explained including risks of esophageal damage, perforation (1:10,000 risk), bleeding, pharyngeal hematoma as well as other potential complications associated with anesthesia including aspiration, arrhythmia, respiratory failure and death. Alternatives to treatment were discussed, questions were answered. Patient is willing to proceed. Scheduled for tomorrow at 12pm with Dr. Gasper Sells. Prior to consenting for TEE I did discuss case with EP APP since pt has a dual chamber PPM in case interrogation would be able to answer the question about potential source of clot - [name] had noted on 3/27 in pre-MRI note that patient was in atrial arrhythmia ~130s underlying 2:1 AVB. I discussed interrogation with [name] who reports there was sinus tach and atrial tach but no afib seen, would still be appropriate for TEE. He is also pending CTA to r/o PE.  Update: subsequent to this TEE request review, our team received word that final echo read has resulted, EF 20-25%. I called into patient room to give him update that our team will be seeing in formal consultation. Left orders in place for TEE as we may still pursue in AM. Full consultation to follow - discussed with PM APP who will be starting full consultation.  Charlie Pitter, PA-C 08/12/2022 3:58 PM

## 2022-08-12 NOTE — Progress Notes (Signed)
  Echocardiogram 2D Echocardiogram has been performed.  Luke Jimenez 08/12/2022, 9:52 AM

## 2022-08-13 ENCOUNTER — Other Ambulatory Visit (HOSPITAL_COMMUNITY): Payer: Self-pay

## 2022-08-13 ENCOUNTER — Inpatient Hospital Stay (HOSPITAL_COMMUNITY): Payer: No Typology Code available for payment source | Admitting: Anesthesiology

## 2022-08-13 ENCOUNTER — Encounter (HOSPITAL_COMMUNITY): Payer: Self-pay | Admitting: Family Medicine

## 2022-08-13 ENCOUNTER — Inpatient Hospital Stay (HOSPITAL_COMMUNITY): Payer: No Typology Code available for payment source

## 2022-08-13 ENCOUNTER — Encounter (HOSPITAL_COMMUNITY)
Admission: EM | Disposition: A | Discharge status: Home / Self Care | Payer: Self-pay | Source: Home / Self Care | Attending: Family Medicine

## 2022-08-13 DIAGNOSIS — I340 Nonrheumatic mitral (valve) insufficiency: Secondary | ICD-10-CM

## 2022-08-13 DIAGNOSIS — I442 Atrioventricular block, complete: Secondary | ICD-10-CM | POA: Diagnosis not present

## 2022-08-13 DIAGNOSIS — I3139 Other pericardial effusion (noninflammatory): Secondary | ICD-10-CM | POA: Diagnosis not present

## 2022-08-13 DIAGNOSIS — I509 Heart failure, unspecified: Secondary | ICD-10-CM | POA: Diagnosis not present

## 2022-08-13 DIAGNOSIS — I429 Cardiomyopathy, unspecified: Secondary | ICD-10-CM

## 2022-08-13 DIAGNOSIS — I639 Cerebral infarction, unspecified: Secondary | ICD-10-CM

## 2022-08-13 DIAGNOSIS — I6310 Cerebral infarction due to embolism of unspecified precerebral artery: Secondary | ICD-10-CM | POA: Diagnosis not present

## 2022-08-13 DIAGNOSIS — R9431 Abnormal electrocardiogram [ECG] [EKG]: Secondary | ICD-10-CM | POA: Diagnosis not present

## 2022-08-13 DIAGNOSIS — R4182 Altered mental status, unspecified: Secondary | ICD-10-CM | POA: Diagnosis not present

## 2022-08-13 DIAGNOSIS — R569 Unspecified convulsions: Secondary | ICD-10-CM | POA: Diagnosis not present

## 2022-08-13 HISTORY — PX: BUBBLE STUDY: SHX6837

## 2022-08-13 HISTORY — PX: TEE WITHOUT CARDIOVERSION: SHX5443

## 2022-08-13 LAB — ECHO TEE
MV M vel: 3.8 m/s
MV Peak grad: 57.8 mmHg
Radius: 0.4 cm

## 2022-08-13 LAB — ANA W/REFLEX IF POSITIVE: Anti Nuclear Antibody (ANA): NEGATIVE

## 2022-08-13 LAB — BASIC METABOLIC PANEL
Anion gap: 10 (ref 5–15)
BUN: 15 mg/dL (ref 6–20)
CO2: 24 mmol/L (ref 22–32)
Calcium: 8.6 mg/dL — ABNORMAL LOW (ref 8.9–10.3)
Chloride: 103 mmol/L (ref 98–111)
Creatinine, Ser: 1.24 mg/dL (ref 0.61–1.24)
GFR, Estimated: >60 mL/min (ref >60)
Glucose, Bld: 90 mg/dL (ref 70–99)
Potassium: 4 mmol/L (ref 3.5–5.1)
Sodium: 137 mmol/L (ref 135–145)

## 2022-08-13 LAB — HOMOCYSTEINE: Homocysteine: 9.3 umol/L (ref 0.0–14.5)

## 2022-08-13 LAB — LUPUS ANTICOAGULANT
DRVVT: 34.5 s (ref 0.0–47.0)
PTT Lupus Anticoagulant: 32.3 s (ref 0.0–43.5)
Thrombin Time: 17.8 s (ref 0.0–23.0)
dPT Confirm Ratio: 1 Ratio (ref 0.00–1.34)
dPT: 39.6 s (ref 0.0–47.6)

## 2022-08-13 LAB — CARDIOLIPIN ANTIBODIES, IGG, IGM, IGA
Anticardiolipin IgA: <9 APL U/mL (ref 0–11)
Anticardiolipin IgG: <9 GPL U/mL (ref 0–14)
Anticardiolipin IgM: 11 MPL U/mL (ref 0–12)

## 2022-08-13 LAB — HEMOGLOBIN A1C
Hgb A1c MFr Bld: 5.4 % (ref 4.8–5.6)
Mean Plasma Glucose: 108 mg/dL

## 2022-08-13 LAB — MAGNESIUM: Magnesium: 2 mg/dL (ref 1.7–2.4)

## 2022-08-13 LAB — BETA-2-GLYCOPROTEIN I ABS, IGG/M/A
Beta-2 Glyco I IgG: <9 GPI IgG units (ref 0–20)
Beta-2-Glycoprotein I IgA: <9 GPI IgA units (ref 0–25)
Beta-2-Glycoprotein I IgM: <9 GPI IgM units (ref 0–32)

## 2022-08-13 LAB — PROTEIN S, TOTAL: Protein S Ag, Total: 70 % (ref 60–150)

## 2022-08-13 SURGERY — ECHOCARDIOGRAM, TRANSESOPHAGEAL
Anesthesia: Monitor Anesthesia Care

## 2022-08-13 MED ORDER — LACOSAMIDE 200 MG PO TABS: 200.0000 mg | ORAL_TABLET | Freq: Two times a day (BID) | ORAL | 2 refills | Status: DC

## 2022-08-13 MED ORDER — APIXABAN 5 MG PO TABS
5.0000 mg | ORAL_TABLET | Freq: Two times a day (BID) | ORAL | Status: DC
  Administered 2022-08-13: 5 mg via ORAL
  Filled 2022-08-13: qty 1

## 2022-08-13 MED ORDER — CARVEDILOL 3.125 MG PO TABS
3.1250 mg | ORAL_TABLET | Freq: Two times a day (BID) | ORAL | Status: DC
  Administered 2022-08-13: 3.125 mg via ORAL
  Filled 2022-08-13: qty 1

## 2022-08-13 MED ORDER — PROPOFOL 500 MG/50ML IV EMUL
INTRAVENOUS | Status: DC | PRN
  Administered 2022-08-13: 20 mg via INTRAVENOUS
  Administered 2022-08-13: 125 ug/kg/min via INTRAVENOUS
  Administered 2022-08-13: 50 mg via INTRAVENOUS

## 2022-08-13 MED ORDER — ASPIRIN 81 MG PO TBEC: 81.0000 mg | DELAYED_RELEASE_TABLET | Freq: Every day | ORAL | Status: DC

## 2022-08-13 MED ORDER — LEVETIRACETAM 750 MG PO TABS: 1500.0000 mg | ORAL_TABLET | Freq: Two times a day (BID) | ORAL | 2 refills | Status: DC

## 2022-08-13 MED ORDER — THYROID 30 MG PO TABS: 45.0000 mg | ORAL_TABLET | Freq: Every day | ORAL | 1 refills | Status: DC

## 2022-08-13 MED ORDER — ROSUVASTATIN CALCIUM 20 MG PO TABS: 20.0000 mg | ORAL_TABLET | Freq: Every day | ORAL | 2 refills | Status: DC

## 2022-08-13 MED ORDER — BUTAMBEN-TETRACAINE-BENZOCAINE 2-2-14 % EX AERO
INHALATION_SPRAY | CUTANEOUS | Status: DC | PRN
  Administered 2022-08-13: 2 via TOPICAL

## 2022-08-13 MED ORDER — SODIUM CHLORIDE 0.9 % IV SOLN: INTRAVENOUS | Status: DC

## 2022-08-13 MED ORDER — APIXABAN 5 MG PO TABS: 5.0000 mg | ORAL_TABLET | Freq: Two times a day (BID) | ORAL | 3 refills | Status: DC

## 2022-08-13 MED ORDER — CARVEDILOL 3.125 MG PO TABS: 3.1250 mg | ORAL_TABLET | Freq: Two times a day (BID) | ORAL | 2 refills | Status: DC

## 2022-08-13 MED ORDER — CLOPIDOGREL BISULFATE 75 MG PO TABS: 75.0000 mg | ORAL_TABLET | Freq: Every day | ORAL | Status: DC

## 2022-08-13 MED ORDER — PHENYLEPHRINE HCL (PRESSORS) 10 MG/ML IV SOLN
INTRAVENOUS | Status: DC | PRN
  Administered 2022-08-13: 80 ug via INTRAVENOUS
  Administered 2022-08-13: 160 ug via INTRAVENOUS

## 2022-08-13 NOTE — TOC Benefit Eligibility Note (Signed)
Patient Teacher, English as a foreign language completed.    The patient is currently admitted and upon discharge could be taking Eliquis 5 mg.  The current 30 day co-pay is $80.00.   The patient is insured through [employer]  This test claim was processed through [facility] amounts may vary at other pharmacies due to pharmacy/plan contracts, or as the patient moves through the different stages of their insurance plan.  Lyndel Safe, [place] Patient Advocate Specialist [facility] Patient Advocate Team Direct Number: [phone]  Fax: [phone]

## 2022-08-13 NOTE — TOC Progression Note (Signed)
Transition of Care [facility]) - Progression Note    Patient Details  Name: Luke Jimenez MRN: [id number] Date of Birth: 05-Sep-1965  Transition of Care [facility]) CM/SW Contact  Pollie Friar, RN Phone Number: 08/13/2022, 3:55 PM  Clinical Narrative:    CM has entered outpatient referral for [facility] outpatient and information placed on AVS.  CM provided pt with 30 day and $10 co pay cards for Eliquis.   Expected Discharge Plan: Home/Self Care Barriers to Discharge: Continued Medical Work up  Expected Discharge Plan and Services       Living arrangements for the past 2 months: Single Family Home                                       Social Determinants of Health (SDOH) Interventions SDOH Screenings   Food Insecurity: No Food Insecurity (08/13/2022)  Housing: Low Risk  (08/13/2022)  Transportation Needs: No Transportation Needs (08/13/2022)  Utilities: Not At Risk (08/10/2022)  Alcohol Screen: Low Risk  (08/13/2022)  Financial Resource Strain: Low Risk  (08/13/2022)  Tobacco Use: Low Risk  (08/13/2022)    Readmission Risk Interventions     No data to display

## 2022-08-13 NOTE — Progress Notes (Addendum)
STROKE TEAM PROGRESS NOTE   SUBJECTIVE (INTERVAL HISTORY) Patient evaluated laying in bed in the procedure suite He has no acute concerns and feels his memory is slowly improving Discussed with him the findings of his echo yesterday and concern for possible PFO. Will follow-up on results of TEE  OBJECTIVE Vitals:   08/13/22 1300 08/13/22 1310 08/13/22 1320 08/13/22 1342  BP: 106/82 99/86 103/89 110/82  Pulse: (!) 105 (!) 108 (!) 101 98  Resp:  12 12 16   Temp: 98.2 F (36.8 C)   97.9 F (36.6 C)  TempSrc: Temporal   Oral  SpO2: 99% 97% 97% 99%  Weight:      Height:        CBC:  Recent Labs  Lab 08/09/22 1407 08/10/22 0711  WBC 8.7 8.3  HGB 15.8 15.3  HCT 45.5 43.1  MCV 97.0 95.1  PLT 285 [id number]    Basic Metabolic Panel:  Recent Labs  Lab 08/09/22 1407 08/10/22 0711  NA 139 133*  K 4.3 3.5  CL 104 101  CO2 24 22  GLUCOSE 114* 93  BUN 14 12  CREATININE 1.03 0.98  CALCIUM 9.2 8.7*    Lipid Panel:  Recent Labs  Lab 08/12/22 0722  CHOL 210*  TRIG 57  HDL 50  CHOLHDL 4.2  VLDL 11  LDLCALC 149*   HgbA1c:  Lab Results  Component Value Date   HGBA1C 5.4 08/12/2022   Urine Drug Screen:     Component Value Date/Time   LABOPIA NONE DETECTED 08/10/2022 0126   COCAINSCRNUR NONE DETECTED 08/10/2022 0126   LABBENZ NONE DETECTED 08/10/2022 0126   AMPHETMU NONE DETECTED 08/10/2022 0126   THCU NONE DETECTED 08/10/2022 0126   LABBARB NONE DETECTED 08/10/2022 0126    Alcohol Level     Component Value Date/Time   ETH <10 08/09/2022 1407    IMAGING  Results for orders placed or performed during the hospital encounter of 08/09/22  MR BRAIN W WO CONTRAST   Narrative   CLINICAL DATA:  Mental status change, unknown cause  EXAM: MRI HEAD WITHOUT AND WITH CONTRAST  TECHNIQUE: Multiplanar, multiecho pulse sequences of the brain and surrounding structures were obtained without and with intravenous contrast.  CONTRAST:  56mL GADAVIST GADOBUTROL 1 MMOL/ML IV  SOLN  COMPARISON:  CT head March 25 24.  FINDINGS: Brain: Acute infarcts involving the left thalamus, left hippocampus, medial left temporal lobe and left occipital lobe, compatible with acute left PCA territory infarcts. Associated edema without significant mass effect. No midline shift. Petechial hemorrhage without mass occupying hemorrhage. No hydrocephalus.  Vascular: Major arterial flow voids are maintained at the skull base.  Skull and upper cervical spine: Normal marrow signal.  Sinuses/Orbits: Largely clear sinuses.  No acute orbital findings.  Other: No mastoid effusions.  IMPRESSION: Acute left PCA territory infarcts, detailed above.   Electronically Signed   By: Margaretha Sheffield M.D.   On: 08/11/2022 09:56   CT Head Wo Contrast   Narrative   CLINICAL DATA:  Altered mental status  EXAM: CT HEAD WITHOUT CONTRAST  TECHNIQUE: Contiguous axial images were obtained from the base of the skull through the vertex without intravenous contrast.  RADIATION DOSE REDUCTION: This exam was performed according to the departmental dose-optimization program which includes automated exposure control, adjustment of the mA and/or kV according to patient size and/or use of iterative reconstruction technique.  COMPARISON:  None Available.  FINDINGS: Brain: There is asymmetric hypodensity along the medial aspect of the left temporal  lobe (series 2, image 12), which may be artifactual, but is suspicious for an acute infarct. Recommend further evaluation with brain MRI no evidence of hemorrhage, hydrocephalus, extra-axial collection or mass lesion/mass effect.  Vascular: No disproportionately hyperdense vessel or unexpected calcification.  Skull: Normal. Negative for fracture or focal lesion.  Sinuses/Orbits: No middle ear or mastoid effusion. Mucosal thickening left maxillary sinus. Orbits are unremarkable  Other: None.  IMPRESSION: Asymmetric hypodensity and loss of  gray/white differentiation along the medial aspect of the left temporal lobe may be artifactual, but is suspicious for an acute infarct. Recommend further evaluation with brain MRI.   Electronically Signed   By: Marin Roberts M.D.   On: 08/09/2022 15:11     PHYSICAL EXAM  General: Well-appearing young Caucasian male. No acute distress. HEENT: [place]/AT. Anicteric sclera. Moist mucous membrane. Respiratory: Clear to auscultation bilaterally. Cardiovascular: Mild tachycardia. Ventricularly paced rhythm. No LE edema Extremities: Warm and well-perfused.  Neuro: Alert and oriented x 3.  Normal speech fluency and comprehension. No dysarthria or aphasia. He has word finding difficulty as well as short-term memory loss. Dimninshed recall 0/3. Able to name only 6 animals which can walk on 4 legs.Normal peripheral vision. Normal finger-nose testing. Strength 5/5 in all extremities. Normal sensation to light touch.  ASSESSMENT/PLAN Luke Jimenez is a 57 y.o. 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 Bedside bubble study concerning for possible shunt. CTA negative for pulmonary fistula and TEE this afternoon negative for PFO or blood clot. Will recommend dual antiplatelet therapy for now and consider anticoagulation if A-fib is captured on device interrogation.  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 MRI head: Acute left PCA territory infarct  CT head and neck: Redemonstrated acute left PCA territory infarct with associated petechial hemorrhage but no progressive mass effect and no LVO 2D Echo:  EF 20-25%, global hypokinesis but no valvular abnormalities. Transcranial Doppler: A full curtain of high intensity transient signals (HITS) was observed at rest and with valsalva, indicating a Spencer Grade 5 patent foramen ovale (PFO)  TEE: Severely decreased LVEF, no PFO CTA chest: No PE or pulmonary fistulous LDL 149 HgbA1c 5.4% Hypercoagulable workup: Normal ANA, ESR, CRP, PT/INR, Antithrombin III.  Pending antiphospholipid labs, homocysteine, protein C/S, factor V Leiden SCD for VTE prophylaxis Diet Order             Diet Heart Room service appropriate? Yes; Fluid consistency: Thin  Diet effective now                  No antithrombotic prior to admission, Eliquis 5 mg twice daily per cardiology for low EF  ongoing aggressive stroke risk factor management Therapy recommendations: No follow-up Disposition: Home Patient can be discharged home from a neurology standpoint Advised to follow-up with outpatient neurology in 8 weeks  Seizures EEG overnight 08/09/22 - 08/10/22:"ABNORMALITY- Seizure without clinical signs, left temporal region. Continuous slow, left temporal region Loaded with Keppra load 3000 mg IV and Vimpat 400 mg IV on 3/27 Continue on Keppra 1500 mg twice daily Continue Vimpat 200 mg twice daily As needed IV Ativan for seizure activity  lasting >3 min Seizure precautions  HFrEF TTE today shows EF 20-25% with global hypokinesis No signs of heart failure exacerbation on exam Cardiology consulted for evaluation, appreciate recs Can initiate GDMT from neurology standpoint if tolerated by BP  Second-degree AVB s/p PPM in 2021 Pacemaker interrogated on 3/27 in pre-MRI note that patient was in atrial arrhythmia ~130s underlying 2:1 AVB. Mostly sinus tach and atrial tachycardia but no A-fib Continue telemetry  Hypertension Soft BP with SBP in the 90s to 110s  Long-term BP goal normotensive GDMT as tolerated by BP  Hyperlipidemia Home meds: None LDL 149, goal  < 70 Add Crestor 20 mg daily Continue statin at discharge  Diabetes type II HgbA1c 5.4%, goal < 7.0  Other Active Problems Hypothyroidism: Per primary team  Hospital day # 3   To contact Stroke Continuity provider, please refer to [email or web address] After hours, contact General Neurology  Lacinda Axon, MD 08/13/2022, 2:58 PM IM Resident, PGY-3 Oswaldo Milian 41:10  I have personally obtained history,examined this patient, reviewed notes, independently viewed imaging studies, participated in medical decision making and plan of care.ROS completed by me personally and pertinent positives fully documented  I have made any additions or clarifications directly to the above note. Agree with note above.  Patient neurological exam is stable.  TEE shows no PFO or clot but severely depressed ejection fraction.  Cardiology cleared for anticoagulation hence recommend switch to Eliquis 5 mg twice daily and discontinue aspirin and Plavix.  Interestingly TCD bubble study showed large right to left shunt but both TEE and CT angiogram chest did not show either PFO or pulmonary AV fistula making this finding likely a false positive which may occur in 5% of cases.  Continue goal-directed medical therapy for heart failure.  Continue Keppra and Vimpat for seizures and follow-up as outpatient with stroke clinic in 2 months.  Stroke team will sign off.  Discussed with patient, Dr. Darrick Meigs and Dr. Percival Spanish.  Greater than 50% time during this 35-minute visit were spent in counseling and coordination of care about his embolic stroke and discussion about evaluation and prevention and treatment and answering questions  Antony Contras, MD Medical Director [facility] Pager: [phone] 08/13/2022 3:25 PM

## 2022-08-13 NOTE — Discharge Summary (Signed)
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 *  Hospital Course:  57 y.o. male, with history of second-degree AV block with pacemaker, presented to ED with complaints of nonspecific complaints of feeling off this morning.  Patient family was concerned that he was having difficulty with speaking over the phone.  Also was having difficulty finding words with forgetfulness.  Patient had slight headache yesterday but no other symptoms.  CT head showed hypodensity in the left temporal lobe suspicious for acute infarct.  MRI could not be obtained as patient has pacemaker, MRI will be obtained in a.m. neurology was consulted, recommended overnight EEG to rule out seizure.     Assessment and Plan:  Acute PCA territory infarct -MRI brain shows acute infarct involving PCA territory -Patient earlier presented with altered mental status, CT head showed hypodensity in the left temporal lobe suspicious for acute infarct -MRI with and without contrast ordered on admission,  not done on day of admission as patient has pacemaker -Stroke workup started per neurology, TEE was done which was negative for thrombus and PFO -Hemoglobin A1c 5.4, lipid profile obtained showed LDL 149, total cholesterol 210 -Patient started on Eliquis 5 mg p.o. twice  daily, atorvastatin 20 mg daily   Cardiomyopathy -Transthoracic echocardiogram obtained showed EF of 20 to 25% -Unclear etiology -Cardiology was consulted -Patient will need ischemic evaluation to rule out ischemia as cause of cardiomyopathy -Patient started on Coreg 3.125 mg p.o. twice daily per cardiology -Follow-up cardiology as outpatient   History of secondary AV block s/p PPM -Cardiology following   Questionable PFO -TCD bubble study showed large PFO -Large right to left shunt raising possibility of paradoxical embolism -Venous duplex of lower extremities obtained was negative for DVT -CTA chest was negative for pulmonary embolism -No PFO observed on TEE     Seizure -Continuous EEG monitoring showed evidence of left temporal seizures  -started on Keppra and Vimpat per neurology -Patient will follow-up with neurology in 2 months   Hypothyroidism -Patient takes thyroid Armour at home -TSH low at 0.063 - T3 119 and free T4 is 1.02 -Confirmed with patient's wife, patient was taking thyroid Armour 60 mg at home -We will cut down the dose of thyroid Armour to 45 mg daily -Repeat TSH in 6 to 8 weeks       Consultants: Cardiology, neurology Procedures performed:  Disposition: Home Diet recommendation:  Discharge Diet Orders (From admission, onward)     Start     Ordered   08/13/22 0000  Diet - low sodium heart healthy        08/13/22 1652           Cardiac diet DISCHARGE MEDICATION: Allergies as of 08/13/2022   No Known Allergies      Medication List     TAKE these medications  apixaban 5 MG Tabs tablet Commonly known as: ELIQUIS Take 1 tablet (5 mg total) by mouth 2 (two) times daily.   carvedilol 3.125 MG tablet Commonly known as: COREG Take 1 tablet (3.125 mg total) by mouth 2 (two) times daily with a meal. HOLD for SBP less than 90   fluticasone 50 MCG/ACT nasal spray Commonly known as: FLONASE Place 1 spray into both nostrils daily as  needed for allergies or rhinitis.   lacosamide 200 MG Tabs tablet Commonly known as: VIMPAT Take 1 tablet (200 mg total) by mouth 2 (two) times daily.   levETIRAcetam 750 MG tablet Commonly known as: KEPPRA Take 2 tablets (1,500 mg total) by mouth 2 (two) times daily.   rosuvastatin 20 MG tablet Commonly known as: CRESTOR Take 1 tablet (20 mg total) by mouth daily. Start taking on: August 14, 2022   thyroid 30 MG tablet Commonly known as: Armour Thyroid Take 1.5 tablets (45 mg total) by mouth daily before breakfast. What changed:  medication strength how much to take when to take this        Follow-up [facility]. Go in 21 day(s).   Specialty: Cardiology Why: Hospital follow up 09/06/2022 @ 9 am PLEASE bring a current medication list to appointment FREE valet parking, Entrance C, off [employer] information: [id number] [place] [facility] Schedule an appointment as soon as possible for a visit in 1 week(s).   Specialty: Rehabilitation Contact information: [place] [address] [place], [place] [id number] [place] [phone]               Discharge Exam: Filed Weights   08/10/22 0700 08/13/22 1044  Weight: 69 kg 64.4 kg   General-appears in no acute distress Heart-S1-S2, regular, no murmur auscultated Lungs-clear to auscultation bilaterally, no wheezing or crackles auscultated Abdomen-soft, nontender, no organomegaly Extremities-no edema in the lower extremities Neuro-alert, oriented x3, no focal deficit noted  Condition at discharge: good  The results of significant diagnostics from this hospitalization (including imaging, microbiology, ancillary and laboratory) are listed below for reference.   Imaging Studies: ECHO TEE  Result Date: 08/13/2022     TRANSESOPHOGEAL ECHO REPORT   Patient Name:   NERO BURKE Date of Exam: 08/13/2022 Medical Rec #:  [id number]   Height:       70.0 in Accession #:    [id number]  Weight:       142.0 lb Date of Birth:  06-26-1965    BSA:          1.805 m Patient Age:    28 years    BP:           101/76 mmHg Patient Gender: M           HR:           106 bpm. Exam Location:  Inpatient Procedure: Transesophageal Echo, Cardiac Doppler, Color Doppler and Saline            Contrast Bubble Study Indications:     Stroke  History:         Patient has prior history of Echocardiogram examinations, most                  recent 09/06/2019. Abnormal ECG, Stroke; Arrythmias:AV Block.  Sonographer:     [place] Referring  Phys:  [id number] Nedra Hai DUNN Diagnosing Phys: Rudean Haskell MD PROCEDURE: After discussion of the risks and benefits of a TEE, an informed consent was obtained from the patient. The transesophogeal probe was passed without difficulty through the esophogus of the patient. Imaged were obtained with the patient in a left lateral decubitus position. Sedation performed by different physician. The patient was monitored while under deep sedation. Anesthestetic sedation was provided intravenously by Anesthesiology: 427mg  of Propofol. The patient's vital signs; including heart rate, blood pressure, and oxygen saturation; remained stable throughout the procedure. The patient developed no complications during the procedure.  IMPRESSIONS  1. Left ventricular ejection fraction, by estimation, is 20 to 25%. The left ventricle has severely decreased function.  2. Right ventricular systolic function is normal. The right ventricular size is not well visualized.  3. No left atrial/left atrial appendage thrombus was detected.  4. A small pericardial effusion is present. The pericardial effusion is posterior to the left ventricle.  5. The mitral valve is grossly normal. Mild mitral valve regurgitation. No evidence of mitral stenosis.  6. The  aortic valve is tricuspid. Aortic valve regurgitation is not visualized. No aortic stenosis is present.  7. Agitated saline contrast bubble study was negative, with no evidence of any interatrial shunt. Given clinical syndorme and presence of lipomatous interatrial septum; three Bubble studdies performed with two Valsalva studies. FINDINGS  Left Ventricle: Left ventricular ejection fraction, by estimation, is 20 to 25%. The left ventricle has severely decreased function. The left ventricular internal cavity size was normal in size. Right Ventricle: The right ventricular size is not well visualized. Right vetricular wall thickness was not well visualized. Right ventricular systolic function is normal. Left Atrium: Left atrial size was normal in size. No left atrial/left atrial appendage thrombus was detected. Right Atrium: Right atrial size was normal in size. Pericardium: A small pericardial effusion is present. The pericardial effusion is posterior to the left ventricle. Mitral Valve: The mitral valve is grossly normal. Mild mitral valve regurgitation. No evidence of mitral valve stenosis. Tricuspid Valve: The tricuspid valve is normal in structure. Tricuspid valve regurgitation is not demonstrated. No evidence of tricuspid stenosis. Aortic Valve: The aortic valve is tricuspid. Aortic valve regurgitation is not visualized. No aortic stenosis is present. Pulmonic Valve: The pulmonic valve was grossly normal. Pulmonic valve regurgitation is mild. No evidence of pulmonic stenosis. Aorta: The aortic root and ascending aorta are structurally normal, with no evidence of dilitation. IAS/Shunts: The interatrial septum appears to be lipomatous. No atrial level shunt detected by color flow Doppler. Agitated saline contrast was given intravenously to evaluate for intracardiac shunting. Agitated saline contrast bubble study was negative,  with no evidence of any interatrial shunt. Additional Comments: A device lead is  visualized in the right atrium and right ventricle. Spectral Doppler performed. MR Peak grad:    57.8 mmHg MR Mean grad:    36.5 mmHg MR Vmax:         380.00 cm/s MR Vmean:        284.5 cm/s MR PISA:         1.01 cm MR PISA Eff ROA: 9 mm MR PISA Radius:  0.40 cm Rudean Haskell MD Electronically signed by Rudean Haskell MD Signature Date/Time: 08/13/2022/4:15:23 PM    Final    CT Angio Chest Pulmonary Embolism (PE) W or WO Contrast  Result Date: 08/12/2022 CLINICAL DATA:  Altered mental status.  Possible pulmonary shunt. EXAM: CT ANGIOGRAPHY CHEST WITH CONTRAST TECHNIQUE: Multidetector CT imaging  of the chest was performed using the standard protocol during bolus administration of intravenous contrast. Multiplanar CT image reconstructions and MIPs were obtained to evaluate the vascular anatomy. RADIATION DOSE REDUCTION: This exam was performed according to the departmental dose-optimization program which includes automated exposure control, adjustment of the mA and/or kV according to patient size and/or use of iterative reconstruction technique. CONTRAST:  53mL OMNIPAQUE IOHEXOL 350 MG/ML SOLN COMPARISON:  None Available. FINDINGS: Cardiovascular: The heart is normal in size. No pericardial effusion. The aorta is normal in caliber. No atherosclerotic calcifications. The pulmonary arterial tree is well opacified. No filling defects to suggest pulmonary embolism. The pulmonary veins appear normal. Probable reflux of contrast down the azygous system. Pacer wires in place without complicating features. There is a smoothly marginated well circumscribed 3.7 cm lesion at the right lung base adjacent to the right ventricle which measures water attenuation and is consistent with a benign epicardial cyst. Mediastinum/Nodes: No mediastinal or hilar mass or lymphadenopathy. The esophagus is unremarkable. Lungs/Pleura: No acute pulmonary findings. No pulmonary edema, pulmonary lesions or pleural effusion. Mild  apical scarring changes. The central tracheobronchial tree is unremarkable. Upper Abdomen: No significant upper abdominal findings. Simple hepatic cysts are noted. High attenuation material in the gallbladder could be related to vicarious excretion of contrast material from prior contrast enhanced study from yesterday. Musculoskeletal: No significant bony findings. Review of the MIP images confirms the above findings. IMPRESSION: 1. No CT findings for pulmonary embolism. 2. Normal thoracic aorta. 3. No acute pulmonary findings. 4. 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 Electronically Signed   By: Marijo Sanes M.D.   On: 08/12/2022 18:19   VAS [place] TRANSCRANIAL DOPPLER W BUBBLES  Result Date: 08/12/2022  Transcranial Doppler with Bubble Patient Name:  HIRAN HARNESS  Date of Exam:   08/12/2022 Medical Rec #: [id number]    Accession #:    [id number] Date of Birth: [birth year]     Patient Gender: M Patient Age:   2 years Exam Location:  [facility] Procedure:      VAS [place] TRANSCRANIAL DOPPLER W BUBBLES Referring Phys: Lovey Newcomer --------------------------------------------------------------------------------  Indications: Stroke. History: Recent extended travel. Performing Technologist: Darlin Coco RDMS, RVT  Examination Guidelines: A complete evaluation includes B-mode imaging, spectral Doppler, color Doppler, and power Doppler as needed of all accessible portions of each vessel. Bilateral testing is considered an integral part of a complete examination. Limited examinations for reoccurring indications may be performed as noted.  Summary:  A vascular evaluation was performed. The left middle cerebral artery was studied. An IV was inserted into the patient's right forearm. Verbal informed consent was obtained.  A full curtain of high intensity transient signals (HITS) was observed at rest and with valsalva, indicating a Spencer Grade 5 right to left shunt Positive TCD Bubble study at rest indicative  of a large right toleft shunt *See table(s) above for TCD measurements and observations.  Diagnosing physician: Antony Contras MD Electronically signed by Antony Contras MD on 08/12/2022 at 5:08:45 PM.    Final    ECHOCARDIOGRAM COMPLETE BUBBLE STUDY  Result Date: 08/12/2022    ECHOCARDIOGRAM REPORT   Patient Name:   REGINALD MCCALEB Date of Exam: 08/12/2022 Medical Rec #:  [id number]   Height:       70.0 in Accession #:    [id number]  Weight:       152.1 lb Date of Birth:  [birth year]    BSA:  1.858 m Patient Age:    73 years    BP:           169/89 mmHg Patient Gender: M           HR:           106 bpm. Exam Location:  Inpatient Procedure: 2D Echo, 3D Echo, Cardiac Doppler, Color Doppler, Saline Contrast            Bubble Study and Strain Analysis Indications:    Stroke  History:        Patient has prior history of Echocardiogram examinations, most                 recent 09/06/2019. Pacemaker, Arrythmias:AV Block 2nd Degree,                 Bradycardia and Tachycardia; Signs/Symptoms:Altered Mental                 Status.  Sonographer:    Eartha Inch Referring Phys: [id number] DEVON SHAFER  Sonographer Comments: Image acquisition challenging due to patient body habitus and Image acquisition challenging due to respiratory motion. Global longitudinal strain was attempted. IMPRESSIONS  1. Left ventricular ejection fraction, by estimation, is 20 to 25%. The left ventricle has severely decreased function. The left ventricle demonstrates global hypokinesis. Left ventricular diastolic function could not be evaluated. Elevated left ventricular end-diastolic pressure. The average left ventricular global longitudinal strain is -7.8 %. The global longitudinal strain is abnormal.  2. Right ventricular systolic function is normal. The right ventricular size is normal.  3. The mitral valve is normal in structure. Mild mitral valve regurgitation. No evidence of mitral stenosis.  4. The aortic valve is normal in structure. Aortic  valve regurgitation is not visualized. No aortic stenosis is present.  5. The inferior vena cava is normal in size with greater than 50% respiratory variability, suggesting right atrial pressure of 3 mmHg.  6. Compared to prior echo dated 09/06/19, LVF has significantly decreased . FINDINGS  Left Ventricle: Left ventricular ejection fraction, by estimation, is 20 to 25%. The left ventricle has severely decreased function. The left ventricle demonstrates global hypokinesis. Definity contrast agent was given IV to delineate the left ventricular endocardial borders. The average left ventricular global longitudinal strain is -7.8 %. The global longitudinal strain is abnormal. 3D ejection fraction reviewed and evaluated as part of the interpretation. Alternate measurement of EF is felt  to be most reflective of LV function. The left ventricular internal cavity size was normal in size. There is no left ventricular hypertrophy. Abnormal (paradoxical) septal motion, consistent with RV pacemaker. Left ventricular diastolic function could not be evaluated due to abnormal septal motion. Left ventricular diastolic function could not be evaluated. Elevated left ventricular end-diastolic pressure. Right Ventricle: The right ventricular size is normal. No increase in right ventricular wall thickness. Right ventricular systolic function is normal. Left Atrium: Left atrial size was normal in size. Right Atrium: Right atrial size was normal in size. Pericardium: There is no evidence of pericardial effusion. Mitral Valve: The mitral valve is normal in structure. Mild mitral valve regurgitation. No evidence of mitral valve stenosis. Tricuspid Valve: The tricuspid valve is normal in structure. Tricuspid valve regurgitation is trivial. No evidence of tricuspid stenosis. Aortic Valve: The aortic valve is normal in structure. Aortic valve regurgitation is not visualized. No aortic stenosis is present. Pulmonic Valve: The pulmonic valve was  normal in structure. Pulmonic valve regurgitation is not visualized. No evidence of  pulmonic stenosis. Aorta: The aortic root is normal in size and structure. Venous: The inferior vena cava is normal in size with greater than 50% respiratory variability, suggesting right atrial pressure of 3 mmHg. IAS/Shunts: No atrial level shunt detected by color flow Doppler. Agitated saline contrast was given intravenously to evaluate for intracardiac shunting. Additional Comments: A device lead is visualized.  LEFT VENTRICLE PLAX 2D LVIDd:         5.60 cm      Diastology LVIDs:         4.90 cm      LV e' medial:    3.05 cm/s LV PW:         0.80 cm      LV E/e' medial:  26.5 LV IVS:        0.70 cm      LV e' lateral:   3.05 cm/s LVOT diam:     2.10 cm      LV E/e' lateral: 26.5 LV SV:         53 LV SV Index:   29           2D Longitudinal Strain LVOT Area:     3.46 cm     2D Strain GLS (A2C):   -10.0 %                             2D Strain GLS (A3C):   -8.7 %                             2D Strain GLS (A4C):   -4.8 % LV Volumes (MOD)            2D Strain GLS Avg:     -7.8 % LV vol d, MOD A2C: 154.0 ml LV vol d, MOD A4C: 176.0 ml LV vol s, MOD A2C: 104.0 ml LV vol s, MOD A4C: 128.0 ml 3D Volume EF: LV SV MOD A2C:     50.0 ml  3D EF:        27 % LV SV MOD A4C:     176.0 ml LV EDV:       202 ml LV SV MOD BP:      44.7 ml  LV ESV:       148 ml                             LV SV:        54 ml RIGHT VENTRICLE            IVC RV S prime:     9.25 cm/s  IVC diam: 1.30 cm LEFT ATRIUM             Index        RIGHT ATRIUM          Index LA diam:        3.70 cm 1.99 cm/m   RA Area:     8.24 cm LA Vol (A2C):   61.4 ml 33.04 ml/m  RA Volume:   14.00 ml 7.53 ml/m LA Vol (A4C):   22.6 ml 12.16 ml/m LA Biplane Vol: 37.1 ml 19.97 ml/m  AORTIC VALVE LVOT Vmax:   116.00 cm/s LVOT Vmean:  76.800 cm/s LVOT VTI:    0.153 m  AORTA Ao Root diam: 3.30 cm MITRAL VALVE MV  Area (PHT): 4.93 cm       SHUNTS MV Decel Time: 154 msec       Systemic VTI:   0.15 m MR Peak grad:    56.9 mmHg    Systemic Diam: 2.10 cm MR Mean grad:    49.0 mmHg MR Vmax:         377.00 cm/s MR Vmean:        337.0 cm/s MR PISA:         0.57 cm MR PISA Eff ROA: 6 mm MR PISA Radius:  0.30 cm MV E velocity: 80.70 cm/s Fransico Him MD Electronically signed by Fransico Him MD Signature Date/Time: 08/12/2022/2:59:17 PM    Final    VAS [place] LOWER EXTREMITY VENOUS (DVT)  Result Date: 08/11/2022  Lower Venous DVT Study Patient Name:  JERMANY TWENTER  Date of Exam:   08/11/2022 Medical Rec #: [id number]    Accession #:    [id number] Date of Birth: 25-Jun-1965     Patient Gender: M Patient Age:   50 years Exam Location:  [facility] Procedure:      VAS [place] LOWER EXTREMITY VENOUS (DVT) Referring Phys: Janine Ores --------------------------------------------------------------------------------  Indications: Stroke, and Recent drive to [place].  Comparison Study: No prior study. Performing Technologist: McKayla Maag RVT, [place]  Examination Guidelines: A complete evaluation includes B-mode imaging, spectral Doppler, color Doppler, and power Doppler as needed of all accessible portions of each vessel. Bilateral testing is considered an integral part of a complete examination. Limited examinations for reoccurring indications may be performed as noted. The reflux portion of the exam is performed with the patient in reverse Trendelenburg.  +---------+---------------+---------+-----------+----------+--------------+ RIGHT    CompressibilityPhasicitySpontaneityPropertiesThrombus Aging +---------+---------------+---------+-----------+----------+--------------+ CFV      Full           Yes      Yes                                 +---------+---------------+---------+-----------+----------+--------------+ SFJ      Full                                                        +---------+---------------+---------+-----------+----------+--------------+ FV Prox  Full                                                         +---------+---------------+---------+-----------+----------+--------------+ FV Mid   Full                                                        +---------+---------------+---------+-----------+----------+--------------+ FV DistalFull                                                        +---------+---------------+---------+-----------+----------+--------------+ PFV      Full                                                        +---------+---------------+---------+-----------+----------+--------------+  POP      Full           Yes      Yes                                 +---------+---------------+---------+-----------+----------+--------------+ PTV      Full                                                        +---------+---------------+---------+-----------+----------+--------------+ PERO     Full                                                        +---------+---------------+---------+-----------+----------+--------------+   +---------+---------------+---------+-----------+----------+--------------+ LEFT     CompressibilityPhasicitySpontaneityPropertiesThrombus Aging +---------+---------------+---------+-----------+----------+--------------+ CFV      Full           Yes      Yes                                 +---------+---------------+---------+-----------+----------+--------------+ SFJ      Full                                                        +---------+---------------+---------+-----------+----------+--------------+ FV Prox  Full                                                        +---------+---------------+---------+-----------+----------+--------------+ FV Mid   Full                                                        +---------+---------------+---------+-----------+----------+--------------+ FV DistalFull                                                         +---------+---------------+---------+-----------+----------+--------------+ PFV      Full                                                        +---------+---------------+---------+-----------+----------+--------------+ POP      Full           Yes      Yes                                 +---------+---------------+---------+-----------+----------+--------------+  PTV      Full                                                        +---------+---------------+---------+-----------+----------+--------------+ PERO     Full                                                        +---------+---------------+---------+-----------+----------+--------------+     Summary: BILATERAL: - No evidence of deep vein thrombosis seen in the lower extremities, bilaterally. - No evidence of superficial venous thrombosis in the lower extremities, bilaterally. -No evidence of popliteal cyst, bilaterally.   *See table(s) above for measurements and observations. Electronically signed by Deitra Mayo MD on 08/11/2022 at 7:03:57 PM.    Final    CT ANGIO HEAD NECK W WO CM  Result Date: 08/11/2022 CLINICAL DATA:  Neuro deficit, acute, stroke suspected EXAM: CT ANGIOGRAPHY HEAD AND NECK TECHNIQUE: Multidetector CT imaging of the head and neck was performed using the standard protocol during bolus administration of intravenous contrast. Multiplanar CT image reconstructions and MIPs were obtained to evaluate the vascular anatomy. Carotid stenosis measurements (when applicable) are obtained utilizing NASCET criteria, using the distal internal carotid diameter as the denominator. RADIATION DOSE REDUCTION: This exam was performed according to the departmental dose-optimization program which includes automated exposure control, adjustment of the mA and/or kV according to patient size and/or use of iterative reconstruction technique. CONTRAST:  30mL OMNIPAQUE IOHEXOL 350 MG/ML SOLN COMPARISON:  Same day MRI head.  FINDINGS: CT HEAD FINDINGS Brain: Redemonstrated acute left PCA territory infarct, better characterized on recent MRI. Associated petechial hemorrhage. No progressive mass effect. No midline shift. No mass lesion. No hydrocephalus. Vascular: See below. Skull: No acute fracture. Sinuses/Orbits: Clear sinuses.  No acute orbital findings. Other: Left sphenoid sinus opacification. Review of the MIP images confirms the above findings CTA NECK FINDINGS Aortic arch: Great vessel origins are patent without significant stenosis. Right carotid system: No evidence of dissection, stenosis (50% or greater), or occlusion. Left carotid system: No evidence of dissection, stenosis (50% or greater), or occlusion. Vertebral arteries: Codominant. No evidence of dissection, stenosis (50% or greater), or occlusion. Skeleton: No acute abnormality limited assessment. Other neck: No acute abnormality on limited assessment. Upper chest: Biapical pleuroparenchymal scarring. No consolidation visualized lung apices. Review of the MIP images confirms the above findings CTA HEAD FINDINGS Anterior circulation: Bilateral intracranial ICAs, MCAs, and ACAs are patent without proximal hemodynamically significant stenosis. Posterior circulation: Bilateral intradural vertebral arteries, basilar artery and Venous sinuses: As permitted by contrast timing, patent. Review of the MIP images confirms the above findings IMPRESSION: 1. Redemonstrated acute left PCA territory infarct with associated petechial hemorrhage. No progressive mass effect. 2. No large vessel occlusion or proximal high-grade stenosis. Electronically Signed   By: Margaretha Sheffield M.D.   On: 08/11/2022 15:54   MR BRAIN W WO CONTRAST  Result Date: 08/11/2022 CLINICAL DATA:  Mental status change, unknown cause EXAM: MRI HEAD WITHOUT AND WITH CONTRAST TECHNIQUE: Multiplanar, multiecho pulse sequences of the brain and surrounding structures were obtained without and with intravenous  contrast. CONTRAST:  30mL GADAVIST GADOBUTROL 1 MMOL/ML IV SOLN COMPARISON:  CT head March 25 24. FINDINGS: Brain: Acute infarcts involving the left thalamus, left hippocampus, medial left temporal lobe and left occipital lobe, compatible with acute left PCA territory infarcts. Associated edema without significant mass effect. No midline shift. Petechial hemorrhage without mass occupying hemorrhage. No hydrocephalus. Vascular: Major arterial flow voids are maintained at the skull base. Skull and upper cervical spine: Normal marrow signal. Sinuses/Orbits: Largely clear sinuses.  No acute orbital findings. Other: No mastoid effusions. IMPRESSION: Acute left PCA territory infarcts, detailed above. Electronically Signed   By: Margaretha Sheffield M.D.   On: 08/11/2022 09:56   Overnight EEG with video  Result Date: 08/10/2022 Lora Havens, MD     08/11/2022  9:09 AM Patient Name: Glenis Michalak MRN: [id number] Epilepsy Attending: Lora Havens Referring Physician/Provider: Katy Apo, NP Duration: 08/09/2022 2224 to 08/10/2022 2224 Patient history: 57 y.o. male, with history of second-degree AV block with pacemaker, presented to ED with complaints of nonspecific complaints of feeling off this morning.  Patient family was concerned that he was having difficulty with speaking over the phone.  Also was having difficulty finding words with forgetfulness. EEG to evaluate for seizure Level of alertness: Awake, asleep AEDs during EEG study: None Technical aspects: This EEG study was done with scalp electrodes positioned according to the 10-20 International system of electrode placement. Electrical activity was reviewed with band pass filter of 1-70Hz , sensitivity of 7 uV/mm, display speed of 48mm/sec with a 60Hz  notched filter applied as appropriate. EEG data were recorded continuously and digitally stored.  Video monitoring was available and reviewed as appropriate. Description: The posterior dominant rhythm  consists of 9-10 Hz activity of moderate voltage (25-35 uV) seen predominantly in posterior head regions, symmetric and reactive to eye opening and eye closing. Sleep was characterized by vertex waves, sleep spindles (12 to 14 Hz), maximal frontocentral region. EEG showed continuous 3 to 6 Hz theta-delta slowing in left temporal region. Hyperventilation and photic stimulation were not performed.   Seizure without clinical signs were noted arising from left temporal region. During seizure, EEG showed 45 Hz theta slowing in left frontotemporal region which then involved all of left hemisphere and eventually right hemisphere and then evolved into 2 to 3 Hz delta slowing.  Seizures were noted on 08/10/2022 at [place], [place], [place], [place], [place], [place], [place], [place], [place], [place], [place], [id number], [place], [place], [place], [place], 1743, 1853, 2012, 2157. Average duration of seizures was about 5 minutes. ABNORMALITY - Seizure without clinical signs, left temporal region - Continuous slow, left temporal region IMPRESSION: This study showed seizure without clinical signs were noted arising from left temporal region as described above, average duration about 5 minutes. Last seizure was noted on 08/10/2022 at 2157. Additionally there is cortical dysfunction arising from left temporal region likely secondary to underlying structural abnormality. Lora Havens   CT Head Wo Contrast  Result Date: 08/09/2022 CLINICAL DATA:  Altered mental status EXAM: CT HEAD WITHOUT CONTRAST TECHNIQUE: Contiguous axial images were obtained from the base of the skull through the vertex without intravenous contrast. RADIATION DOSE REDUCTION: This exam was performed according to the departmental dose-optimization program which includes automated exposure control, adjustment of the mA and/or kV according to patient size and/or use of iterative reconstruction technique. COMPARISON:  None Available. FINDINGS: Brain: There is asymmetric hypodensity along the medial  aspect of the left temporal lobe (series 2, image 12), which may be artifactual, but is suspicious for an acute infarct. Recommend further evaluation with  brain MRI no evidence of hemorrhage, hydrocephalus, extra-axial collection or mass lesion/mass effect. Vascular: No disproportionately hyperdense vessel or unexpected calcification. Skull: Normal. Negative for fracture or focal lesion. Sinuses/Orbits: No middle ear or mastoid effusion. Mucosal thickening left maxillary sinus. Orbits are unremarkable Other: None. IMPRESSION: Asymmetric hypodensity and loss of gray/white differentiation along the medial aspect of the left temporal lobe may be artifactual, but is suspicious for an acute infarct. Recommend further evaluation with brain MRI. Electronically Signed   By: Marin Roberts M.D.   On: 08/09/2022 15:11    Microbiology: Results for orders placed or performed during the hospital encounter of 12/20/19  SARS Coronavirus 2 by RT PCR (hospital order, performed in [facility] hospital lab) Nasopharyngeal Nasopharyngeal Swab     Status: None   Collection Time: 12/20/19  6:18 AM   Specimen: Nasopharyngeal Swab  Result Value Ref Range Status   SARS Coronavirus 2 NEGATIVE NEGATIVE Final    Comment: (NOTE) SARS-CoV-2 target nucleic acids are NOT DETECTED.  The SARS-CoV-2 RNA is generally detectable in upper and lower respiratory specimens during the acute phase of infection. The lowest concentration of SARS-CoV-2 viral copies this assay can detect is 250 copies / mL. A negative result does not preclude SARS-CoV-2 infection and should not be used as the sole basis for treatment or other patient management decisions.  A negative result may occur with improper specimen collection / handling, submission of specimen other than nasopharyngeal swab, presence of viral mutation(s) within the areas targeted by this assay, and inadequate number of viral copies (<250 copies / mL). A negative result must be  combined with clinical observations, patient history, and epidemiological information.  Fact Sheet for Patients:   StrictlyIdeas.no  Fact Sheet for Healthcare Providers: BankingDealers.co.za  This test is not yet approved or  cleared by the [place] FDA and has been authorized for detection and/or diagnosis of SARS-CoV-2 by FDA under an Emergency Use Authorization (EUA).  This EUA will remain in effect (meaning this test can be used) for the duration of the COVID-19 declaration under Section 564(b)(1) of the Act, 21 U.S.C. section 360bbb-3(b)(1), unless the authorization is terminated or revoked sooner.  Performed at [facility], [place] [address] [place], [place] [address]     Labs: CBC: Recent Labs  Lab 08/09/22 1407 08/10/22 0711  WBC 8.7 8.3  HGB 15.8 15.3  HCT 45.5 43.1  MCV 97.0 95.1  PLT 285 [id number]   Basic Metabolic Panel: Recent Labs  Lab 08/09/22 1407 08/10/22 0711 08/13/22 1426  NA 139 133* 137  K 4.3 3.5 4.0  CL 104 101 103  CO2 24 22 24   GLUCOSE 114* 93 90  BUN 14 12 15   CREATININE 1.03 0.98 1.24  CALCIUM 9.2 8.7* 8.6*  MG  --   --  2.0   Liver Function Tests: Recent Labs  Lab 08/09/22 1407 08/10/22 0711  AST 22 21  ALT 12 11  ALKPHOS 56 51  BILITOT 0.3 1.0  PROT 6.9 6.4*  ALBUMIN 4.2 3.7   CBG: Recent Labs  Lab 08/11/22 1801 08/11/22 2257 08/12/22 0615 08/12/22 1343 08/12/22 1803  GLUCAP 101* 88 85 126* 113*    Discharge time spent: greater than 30 minutes.  Signed: [name], MD [facility] 08/13/2022

## 2022-08-13 NOTE — Progress Notes (Signed)
ANTICOAGULATION CONSULT NOTE - Initial Consult  Pharmacy Consult for Apixaban Indication:  cardioembolic stroke  No Known Allergies  Patient Measurements: Height: 5\' 10"  (177.8 cm) Weight: 64.4 kg (142 lb) IBW/kg (Calculated) : 73   Vital Signs: Temp: 97.9 F (36.6 C) (03/29 1342) Temp Source: Oral (03/29 1342) BP: 110/82 (03/29 1342) Pulse Rate: 98 (03/29 1342)  Labs: Recent Labs    08/12/22 1517 08/13/22 1426  APTT 30  --   LABPROT 13.8  --   INR 1.1  --   CREATININE  --  1.24    Estimated Creatinine Clearance: 59.1 mL/min (by C-G formula based on SCr of 1.24 mg/dL).   Medical History: Past Medical History:  Diagnosis Date   Bradycardia    Second degree AV block     Medications:  Medications Prior to Admission  Medication Sig Dispense Refill Last Dose   fluticasone (FLONASE) 50 MCG/ACT nasal spray Place 1 spray into both nostrils daily as needed for allergies or rhinitis. (Patient not taking: Reported on 08/09/2022)   Not Taking   thyroid (ARMOUR) 60 MG tablet Take 60 mg by mouth daily. 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 ECHO on 08/13/2022 showed EF of 20-25% with severely decreased LV function.   Patient was not taking any anticoagulants prior to admission.    Age <80,  weight is >60 kg and Scr is <1.5.   Will start apixaban 5 mg PO BID.   Goal of Therapy:  Stroke prevention Monitor platelets by anticoagulation protocol: Yes   Plan:  Start Apixaban 5mg  BID Monitor for s/sx of bleeding Copay is $80.00.   TOC care management consult if patient needs medication assistance.   Thank you for allowing pharmacy to be part of this patients care team.  Nicole Cella, RPh Clinical Pharmacist 08/13/2022,3:39 PM Please check AMION for all [place] phone numbers After 10:00 PM, call [place]

## 2022-08-13 NOTE — Progress Notes (Signed)
   Heart Failure Stewardship Pharmacist Progress Note   PCP: Glenis Smoker, MD PCP-Cardiologist: Candee Furbish, MD    HPI:  57 yo M with PMH significant for second-degree AV block with a pacemaker. He presents to the ED with AMS that started when he awoke that morning. CT head showed hypodensity in the left temporal lobe suspicious for acute infarct. MRI showed acute left PCA territory infarct.  ECHO on 08/13/2022 showed EF of 20-25% with severely decreased LV function.   Current HF Medications: None  Prior to admission HF Medications: None  Pertinent Lab Values: In process  Vital Signs: Weight: admission weight: 152 lbs Blood pressure: 100-80/60-70s  Heart rate: 90-100s  I/O: +0.170 L yesterday; net +0.565 L  Medication Assistance / Insurance Benefits Check: Does the patient have prescription insurance?  Yes Type of insurance plan: Rx Advance  Does the patient qualify for medication assistance through manufacturers or grants?   Pending Eligible grants and/or patient assistance programs: pending Medication assistance applications in progress: none  Medication assistance applications approved: none Approved medication assistance renewals will be completed by: pending  Outpatient Pharmacy:  Prior to admission outpatient pharmacy: [facility] Is the patient willing to use [facility] at discharge? Pending Is the patient willing to transition their outpatient pharmacy to utilize a [facility] outpatient pharmacy?   Pending    Assessment: 1. Acute on systolic CHF (LVEF 0000000). NYHA class II symptoms. - Would benefit from ARNI/MRA therapy if BP will allow prior to discharge/outpatient. - Would benefit from BB therapy given HR elevations. Consider initiation of this tomorrow pending BP.  - Would benefit from SGLT2i therapy. Consider initiation tomorrow. - Daily standing weights. Keep K>4 and Mg >2. Strict I/Os.  - Not currently volume overloaded on exam to warrant  diuretic therapy.    Plan: 1) Medication changes recommended at this time: - None at this time  2) Patient assistance: - [place] copay: $39.99 Wilder Glade copay: $15 Delene Loll copay: not covered on insurance; if added, will attempt to get a prior authorization  3)  Education  - Initial education provided - Full education to be completed prior to discharge  Maryan Puls, PharmD PGY-1 [facility] Pharmacy Resident

## 2022-08-13 NOTE — Plan of Care (Signed)
  Problem: Education: Goal: Knowledge of General Education information will improve Description: Including pain rating scale, medication(s)/side effects and non-pharmacologic comfort measures Outcome: Adequate for Discharge   Problem: Health Behavior/Discharge Planning: Goal: Ability to manage health-related needs will improve Outcome: Adequate for Discharge   Problem: Clinical Measurements: Goal: Ability to maintain clinical measurements within normal limits will improve Outcome: Adequate for Discharge Goal: Will remain free from infection Outcome: Adequate for Discharge Goal: Diagnostic test results will improve Outcome: Adequate for Discharge Goal: Respiratory complications will improve Outcome: Adequate for Discharge Goal: Cardiovascular complication will be avoided Outcome: Adequate for Discharge   Problem: Activity: Goal: Risk for activity intolerance will decrease Outcome: Adequate for Discharge   Problem: Nutrition: Goal: Adequate nutrition will be maintained Outcome: Adequate for Discharge   Problem: Coping: Goal: Level of anxiety will decrease Outcome: Adequate for Discharge   Problem: Elimination: Goal: Will not experience complications related to bowel motility Outcome: Adequate for Discharge Goal: Will not experience complications related to urinary retention Outcome: Adequate for Discharge   Problem: Pain Managment: Goal: General experience of comfort will improve Outcome: Adequate for Discharge   Problem: Safety: Goal: Ability to remain free from injury will improve Outcome: Adequate for Discharge   Problem: Skin Integrity: Goal: Risk for impaired skin integrity will decrease Outcome: Adequate for Discharge   Problem: Education: Goal: Knowledge of disease or condition will improve Outcome: Adequate for Discharge Goal: Knowledge of secondary prevention will improve (MUST DOCUMENT ALL) Outcome: Adequate for Discharge Goal: Knowledge of patient  specific risk factors will improve (Mark N/A or DELETE if not current risk factor) Outcome: Adequate for Discharge   Problem: Ischemic Stroke/TIA Tissue Perfusion: Goal: Complications of ischemic stroke/TIA will be minimized Outcome: Adequate for Discharge   Problem: Coping: Goal: Will verbalize positive feelings about self Outcome: Adequate for Discharge Goal: Will identify appropriate support needs Outcome: Adequate for Discharge   Problem: Health Behavior/Discharge Planning: Goal: Ability to manage health-related needs will improve Outcome: Adequate for Discharge Goal: Goals will be collaboratively established with patient/family Outcome: Adequate for Discharge   Problem: Self-Care: Goal: Ability to participate in self-care as condition permits will improve Outcome: Adequate for Discharge Goal: Verbalization of feelings and concerns over difficulty with self-care will improve Outcome: Adequate for Discharge Goal: Ability to communicate needs accurately will improve Outcome: Adequate for Discharge   Problem: Nutrition: Goal: Risk of aspiration will decrease Outcome: Adequate for Discharge Goal: Dietary intake will improve Outcome: Adequate for Discharge   

## 2022-08-13 NOTE — Discharge Instructions (Signed)
Information on my medicine - ELIQUIS® (apixaban) ° °This medication education was reviewed with me or my healthcare representative as part of my discharge preparation.   ° ° °Why was Eliquis® prescribed for you? °Eliquis® was prescribed for you to reduce the risk of a blood clot forming that can cause a stroke. ° °What do You need to know about Eliquis® ? °Take your Eliquis® TWICE DAILY - one tablet in the morning and one tablet in the evening with or without food. If you have difficulty swallowing the tablet whole please discuss with your pharmacist how to take the medication safely. ° °Take Eliquis® exactly as prescribed by your doctor and DO NOT stop taking Eliquis® without talking to the doctor who prescribed the medication.  Stopping may increase your risk of developing a stroke.  Refill your prescription before you run out. ° °After discharge, you should have regular check-up appointments with your healthcare provider that is prescribing your Eliquis®.  In the future your dose may need to be changed if your kidney function or weight changes by a significant amount or as you get older. ° °What do you do if you miss a dose? °If you miss a dose, take it as soon as you remember on the same day and resume taking twice daily.  Do not take more than one dose of ELIQUIS at the same time to make up a missed dose. ° °Important Safety Information °A possible side effect of Eliquis® is bleeding. You should call your healthcare provider right away if you experience any of the following: °Bleeding from an injury or your nose that does not stop. °Unusual colored urine (red or dark brown) or unusual colored stools (red or black). °Unusual bruising for unknown reasons. °A serious fall or if you hit your head (even if there is no bleeding). ° °Some medicines may interact with Eliquis® and might increase your risk of bleeding or clotting while on Eliquis®. To help avoid this, consult your healthcare provider or pharmacist prior  to using any new prescription or non-prescription medications, including herbals, vitamins, non-steroidal anti-inflammatory drugs (NSAIDs) and supplements. ° °This website has more information on Eliquis® (apixaban): http://www.eliquis.com/eliquis/home  °

## 2022-08-13 NOTE — Progress Notes (Signed)
   Patient Name: Luke Jimenez Date of Encounter: 08/13/2022 [place] Cardiologist: Candee Furbish, MD   Interval Summary   Denies chest pain or SOB.   Vital Signs   Vitals:   08/12/22 1952 08/12/22 2339 08/13/22 0348 08/13/22 0823  BP: 101/73 (!) 85/58 93/68 101/76  Pulse: 95 100 94 (!) 110  Resp:  18 18 18   Temp:  98.4 F (36.9 C) 97.7 F (36.5 C) 98 F (36.7 C)  TempSrc:  Oral Oral Oral  SpO2:  100% 100% 100%  Weight:      Height:        Intake/Output Summary (Last 24 hours) at 08/13/2022 1003 Last data filed at 08/13/2022 [id number] Gross per 24 hour  Intake 660 ml  Output 550 ml  Net 110 ml      08/10/2022    7:00 AM 04/06/2021    8:07 AM 03/23/2021   11:31 AM  Last 3 Weights  Weight (lbs) 152 lb 1.9 oz 152 lb 3.2 oz 149 lb  Weight (kg) 69 kg 69.037 kg 67.586 kg      Telemetry/ECG    ST with 100% ventricular pacing.  Rare ventricular ectopy.  - Personally Reviewed  Physical Exam   GEN: No acute distress.   Neck: No JVD Cardiac: RRR, no murmurs, rubs, Positive S3.  Marland Kitchen  Respiratory: Clear to auscultation bilaterally. GI: Soft, nontender, non-distended  MS: No edema  Assessment & Plan    CVA:  Likely embolic.  Look for an embolic source with TEE.  Also confirm PFO which will need closure if it is confirmed.  Will discuss anticoagulation with neurology following TEE.  Hypercoag work up thus far negative.   CM:  Doubt ischemic but will plan out patient CT to evaluate coronaries in the future.  Doubt infiltrative but will consider this given the history of CHB as well.  This might also be pacer induced as he is 100% apical RV paced.  I have spoken with EP and have him scheduled for follow up and I will suggest eventual left bundle area pacing and possible upgrade to LV device.  In the meantime we will pursue OM as BP allows.  (Currently hypotensive)  I would like to start with low dose beta blocker as he has sinus tach driving RV pacing.  I will check back  later today to see if we can adjust meds further.   CHB:  As above.    For questions or updates, please contact [place] Please consult [email or web address] for contact info under        Signed, Minus Breeding, MD

## 2022-08-13 NOTE — Anesthesia Preprocedure Evaluation (Addendum)
Anesthesia Evaluation  Patient identified by MRN, date of birth, ID band Patient awake    Reviewed: Allergy & Precautions, NPO status , Patient's Chart, lab work & pertinent test results  Airway Mallampati: I       Dental no notable dental hx.    Pulmonary neg pulmonary ROS   Pulmonary exam normal        Cardiovascular +CHF  Normal cardiovascular exam+ dysrhythmias + pacemaker      Neuro/Psych CVA, Residual Symptoms  negative psych ROS   GI/Hepatic negative GI ROS, Neg liver ROS,,,  Endo/Other  negative endocrine ROS    Renal/GU negative Renal ROS  negative genitourinary   Musculoskeletal negative musculoskeletal ROS (+)    Abdominal Normal abdominal exam  (+)   Peds  Hematology negative hematology ROS (+)   Anesthesia Other Findings Left ventricular ejection fraction, by estimation, is 20 to 25% . The left ventricle has severely decreased function. The left ventricle demonstrates global hypokinesis. Left ventricular diastolic function could not be evaluated. Elevated left ventricular end- diastolic pressure. The average left ventricular global longitudinal strain is - 7. 8 % . The global longitudinal strain is abnormal. 2. Right ventricular systolic function is normal. The right ventricular size is normal. 3. The mitral valve is normal in structure. Mild mitral valve regurgitation. No evidence of mitral stenosis. 4. The aortic valve is normal in structure. Aortic valve regurgitation is not visualized. No aortic stenosis is present. 5. The inferior vena cava is normal in size with greater than 50% respiratory variability, suggesting right atrial pressure of 3 mmHg. 6. Compared to prior echo dated 4/ 22/ 21, LVF has significantly decreased .  FINDINGS Left Ventricle: yChart Results Release  MyChart Status: Active  Results Release  Indications  Heart block AV complete ([place]) [I44.2 (ICD-10-CM)]  Conclusion  Scheduled  remote reviewed. Normal device function.   4 NSVT, 5-7 beats Next remote 91 days. LA   Reproductive/Obstetrics                             Anesthesia Physical Anesthesia Plan  ASA: 3  Anesthesia Plan: MAC   Post-op Pain Management:    Induction:   PONV Risk Score and Plan: Propofol infusion and TIVA  Airway Management Planned: Natural Airway and Mask  Additional Equipment: None and TEE  Intra-op Plan:   Post-operative Plan:   Informed Consent: I have reviewed the patients History and Physical, chart, labs and discussed the procedure including the risks, benefits and alternatives for the proposed anesthesia with the patient or authorized representative who has indicated his/her understanding and acceptance.     Dental advisory given  Plan Discussed with: CRNA  Anesthesia Plan Comments:        Anesthesia Quick Evaluation

## 2022-08-13 NOTE — Progress Notes (Signed)
Heart Failure Nurse Navigator Progress Note  PCP: Glenis Smoker, MD PCP-Cardiologist: [facility] Admission Diagnosis: Altered mental status Admitted from: Home  Presentation:   Luke Jimenez presented with "feeling off", difficulty remembering date of birth, Altered mental status. BP 132/85, HR 116, EKG AV paced, CT of brain, suspicious for a acute infarct. Overnight EEG with evidence of subclinical seizures. Planned TEE for 08/13/2022.   Patient and family educated on the sign and symptoms of heart failure, daily weights, when to call his doctor or go to the ED. Diet/ fluid restrictions, taking all medications as prescribed and attending all medical appointments. Patient verbalized his understanding of education, a HF TOC appointment was scheduled for 09/06/2022 @ 9 am per patient request.He will be out of town for the next couple of weeks.   ECHO/ LVEF: 20-25%  New  Clinical Course:  Past Medical History:  Diagnosis Date   Bradycardia    Second degree AV block      Social History   Socioeconomic History   Marital status: Married    Spouse name: Not on file   Number of children: Not on file   Years of education: Not on file   Highest education level: Not on file  Occupational History   Not on file  Tobacco Use   Smoking status: Never    Passive exposure: Never   Smokeless tobacco: Never  Vaping Use   Vaping Use: Never used  Substance and Sexual Activity   Alcohol use: Yes    Comment: 1 drink a week   Drug use: Never   Sexual activity: Not on file  Other Topics Concern   Not on file  Social History Narrative   Not on file   Social Determinants of Health   Financial Resource Strain: Not on file  Food Insecurity: No Food Insecurity (08/10/2022)   Hunger Vital Sign    Worried About Running Out of Food in the Last Year: Never true    Ran Out of Food in the Last Year: Never true  Transportation Needs: No Transportation Needs (08/10/2022)   PRAPARE - Armed forces logistics/support/administrative officer (Medical): No    Lack of Transportation (Non-Medical): No  Physical Activity: Not on file  Stress: Not on file  Social Connections: Not on file   Education Assessment and Provision:  Detailed education and instructions provided on heart failure disease management including the following:  Signs and symptoms of Heart Failure When to call the physician Importance of daily weights Low sodium diet Fluid restriction Medication management Anticipated future follow-up appointments  Patient education given on each of the above topics.  Patient acknowledges understanding via teach back method and acceptance of all instructions.  Education Materials:  "Living Better With Heart Failure" Booklet, HF zone tool, & Daily Weight Tracker Tool.  Patient has scale at home: yes Patient has pill box at home: NA    High Risk Criteria for Readmission and/or Poor Patient Outcomes: Heart failure hospital admissions (last 6 months): 0  No Show rate: 0 Difficult social situation: No Demonstrates medication adherence: Yes Primary Language: English Literacy level: Reading,writing, and comprehension  Barriers of Care:   Diet/ fluids/ daily weights New EF 20-25%   Considerations/Referrals:   Referral made to Heart Failure Pharmacist Stewardship: Yes Referral made to Heart Failure CSW/NCM TOC: No Referral made to Heart & Vascular TOC clinic: Yes, 09/06/2022 @ 9 am per patient request,will be out of town over the next 2-3 weeks.   Items  for Follow-up on DC/TOC: Diet/ fluid/ daily weights New EF 20-25% Continued HF education    Earnestine Leys, BSN, RN Heart Failure Leisure centre manager Chat Only

## 2022-08-13 NOTE — Transfer of Care (Signed)
Immediate Anesthesia Transfer of Care Note  Patient: Luke Jimenez  Procedure(s) Performed: TRANSESOPHAGEAL ECHOCARDIOGRAM (TEE) BUBBLE STUDY  Patient Location: PACU  Anesthesia Type:MAC  Level of Consciousness: awake and drowsy  Airway & Oxygen Therapy: Patient Spontanous Breathing and Patient connected to nasal cannula oxygen  Post-op Assessment: Report given to RN and Post -op Vital signs reviewed and stable  Post vital signs: Reviewed and stable  Last Vitals:  Vitals Value Taken Time  BP 106/82   Temp    Pulse 105   Resp 15   SpO2 98     Last Pain:  Vitals:   08/13/22 1044  TempSrc: Temporal  PainSc: 0-No pain         Complications: No notable events documented.

## 2022-08-13 NOTE — Progress Notes (Signed)
  Echocardiogram Echocardiogram Transesophageal has been performed.  Luke Jimenez 08/13/2022, 12:49 PM

## 2022-08-13 NOTE — CV Procedure (Signed)
    TRANSESOPHAGEAL ECHOCARDIOGRAM   NAME:  Luke Jimenez    MRN: [id number] DOB:  [birth year]    ADMIT DATE: 08/09/2022  INDICATIONS: Stroke  PROCEDURE:   Informed consent was obtained prior to the procedure. The risks, benefits and alternatives for the procedure were discussed and the patient comprehended these risks.  Risks include, but are not limited to, cough, sore throat, vomiting, nausea, somnolence, esophageal and stomach trauma or perforation, bleeding, low blood pressure, aspiration, pneumonia, infection, trauma to the teeth and death.    Procedural time out performed. The oropharynx was anesthetized with topical 1% benzocaine.    Anesthesia was administered by Dr. Jillyn Hidden and team.  he patient's heart rate, blood pressure, and oxygen saturation are monitored continuously during the procedure.  The transesophageal probe was inserted in the esophagus and stomach without difficulty and multiple views were obtained.   COMPLICATIONS:    There were no immediate complications.  KEY FINDINGS:  Lipomatous interatrial septum with negative resting and Valsalva Bubble study. LVEF severe decreased.    Full report to follow. Further management per primary team.   Rudean Haskell, MD [place]  2:06 PM

## 2022-08-13 NOTE — Progress Notes (Signed)
Slight blood tinged sputum after procedure.  No blood with OG.  No issues drinking water.  Immediately after procedure did not remember why he was in the hospital; no neuro deficits motor or sensory.  This resolved prior to transfer.

## 2022-08-13 NOTE — Progress Notes (Signed)
[facility]  PROGRESS NOTE  Luke Jimenez [id number] DOB: 1965/07/19 DOA: 08/09/2022 PCP: Glenis Smoker, MD   Brief HPI:    57 y.o. male, with history of second-degree AV block with pacemaker, presented to ED with complaints of nonspecific complaints of feeling off this morning.  Patient family was concerned that he was having difficulty with speaking over the phone.  Also was having difficulty finding words with forgetfulness.  Patient had slight headache yesterday but no other symptoms.  CT head showed hypodensity in the left temporal lobe suspicious for acute infarct.  MRI could not be obtained as patient has pacemaker, MRI will be obtained in a.m. neurology was consulted, recommended overnight EEG to rule out seizure.     Subjective   Patient seen and examined, denies any complaints.   Assessment/Plan:     Acute PCA territory infarct -MRI brain shows acute infarct involving PCA territory -Patient earlier presented with altered mental status, CT head showed hypodensity in the left temporal lobe suspicious for acute infarct -MRI with and without contrast ordered on admission,  not done on day of admission as patient has pacemaker -Stroke workup started per neurology, plan for TEE per cardiology -Hemoglobin A1c 5.4, lipid profile obtained showed LDL 149, total cholesterol 210  Cardiomyopathy -Transthoracic echocardiogram obtained showed EF of 20 to 25% -Unclear etiology -Cardiology was consulted -Patient will need ischemic evaluation to rule out ischemia as cause of cardiomyopathy  History of secondary AV block s/p PPM -Cardiology following  PFO -TCD bubble study showed large PFO -Large right to left shunt raising possibility of paradoxical embolism -Venous duplex of lower extremities obtained was negative for DVT -CTA chest was negative for pulmonary embolism   Seizure -Continuous EEG monitoring showed evidence of left temporal seizures  -started on Keppra  per neurology -Neurology following  Hypothyroidism -Patient takes thyroid Armour at home -TSH low at 0.063 - T3 119 and free T4 is 1.02 -Confirmed with patient's wife, patient was taking thyroid Armour 60 mg at home    Medications     aspirin EC  325 mg Oral Daily   lacosamide  200 mg Oral BID   levETIRAcetam  1,500 mg Oral BID   rosuvastatin  20 mg Oral Daily     Data Reviewed:   CBG:  Recent Labs  Lab 08/11/22 1801 08/11/22 2257 08/12/22 0615 08/12/22 1343 08/12/22 1803  GLUCAP 101* 88 85 126* 113*    SpO2: 100 %    Vitals:   08/12/22 1952 08/12/22 2339 08/13/22 0348 08/13/22 0823  BP: 101/73 (!) 85/58 93/68 101/76  Pulse: 95 100 94 (!) 110  Resp:  18 18 18   Temp:  98.4 F (36.9 C) 97.7 F (36.5 C) 98 F (36.7 C)  TempSrc:  Oral Oral Oral  SpO2:  100% 100% 100%  Weight:      Height:          Data Reviewed:  Basic Metabolic Panel: Recent Labs  Lab 08/09/22 1407 08/10/22 0711  NA 139 133*  K 4.3 3.5  CL 104 101  CO2 24 22  GLUCOSE 114* 93  BUN 14 12  CREATININE 1.03 0.98  CALCIUM 9.2 8.7*    CBC: Recent Labs  Lab 08/09/22 1407 08/10/22 0711  WBC 8.7 8.3  HGB 15.8 15.3  HCT 45.5 43.1  MCV 97.0 95.1  PLT 285 240    LFT Recent Labs  Lab 08/09/22 1407 08/10/22 0711  AST 22 21  ALT 12 11  ALKPHOS 56  17  BILITOT 0.3 1.0  PROT 6.9 6.4*  ALBUMIN 4.2 3.7     Antibiotics: Anti-infectives (From admission, onward)    None        DVT prophylaxis: SCDs  Code Status: Full code  Family Communication: Discussed with patient's wife on phone on 08/09/2022   CONSULTS neurology   Objective    Physical Examination:  General-appears in no acute distress Heart-S1-S2, regular, no murmur auscultated Lungs-clear to auscultation bilaterally, no wheezing or crackles auscultated Abdomen-soft, nontender, no organomegaly Extremities-no edema in the lower extremities Neuro-alert, oriented x3, no focal deficit noted   Status  is: Inpatient:             [name]   [facility] If 7PM-7AM, please contact night-coverage at [email or web address], Office  [phone]   08/13/2022, 8:54 AM  LOS: 3 days

## 2022-08-14 ENCOUNTER — Encounter (HOSPITAL_COMMUNITY): Payer: Self-pay | Admitting: *Deleted

## 2022-08-14 ENCOUNTER — Emergency Department (HOSPITAL_COMMUNITY)
Admission: EM | Admit: 2022-08-14 | Discharge: 2022-08-14 | Disposition: A | Discharge status: Home / Self Care | Payer: No Typology Code available for payment source | Attending: Emergency Medicine | Admitting: Emergency Medicine

## 2022-08-14 ENCOUNTER — Emergency Department (HOSPITAL_COMMUNITY): Payer: No Typology Code available for payment source

## 2022-08-14 ENCOUNTER — Other Ambulatory Visit: Payer: Self-pay

## 2022-08-14 DIAGNOSIS — R531 Weakness: Secondary | ICD-10-CM

## 2022-08-14 DIAGNOSIS — R4781 Slurred speech: Secondary | ICD-10-CM

## 2022-08-14 DIAGNOSIS — Z7901 Long term (current) use of anticoagulants: Secondary | ICD-10-CM | POA: Diagnosis not present

## 2022-08-14 HISTORY — DX: Cerebral infarction, unspecified: I63.9

## 2022-08-14 LAB — CBC WITH DIFFERENTIAL/PLATELET
Abs Immature Granulocytes: 0.02 10*3/uL (ref 0.00–0.07)
Basophils Absolute: 0.1 10*3/uL (ref 0.0–0.1)
Basophils Relative: 1 %
Eosinophils Absolute: 0.2 10*3/uL (ref 0.0–0.5)
Eosinophils Relative: 3 %
HCT: 47.3 % (ref 39.0–52.0)
Hemoglobin: 16.4 g/dL (ref 13.0–17.0)
Immature Granulocytes: 0 %
Lymphocytes Relative: 22 %
Lymphs Abs: 1.8 10*3/uL (ref 0.7–4.0)
MCH: 33.5 pg (ref 26.0–34.0)
MCHC: 34.7 g/dL (ref 30.0–36.0)
MCV: 96.7 fL (ref 80.0–100.0)
Monocytes Absolute: 0.7 10*3/uL (ref 0.1–1.0)
Monocytes Relative: 9 %
Neutro Abs: 5.2 10*3/uL (ref 1.7–7.7)
Neutrophils Relative %: 65 %
Platelets: 257 10*3/uL (ref 150–400)
RBC: 4.89 MIL/uL (ref 4.22–5.81)
RDW: 12.5 % (ref 11.5–15.5)
WBC: 8 10*3/uL (ref 4.0–10.5)
nRBC: 0 % (ref 0.0–0.2)

## 2022-08-14 LAB — BASIC METABOLIC PANEL
Anion gap: 13 (ref 5–15)
BUN: 19 mg/dL (ref 6–20)
CO2: 23 mmol/L (ref 22–32)
Calcium: 9 mg/dL (ref 8.9–10.3)
Chloride: 102 mmol/L (ref 98–111)
Creatinine, Ser: 1.37 mg/dL — ABNORMAL HIGH (ref 0.61–1.24)
GFR, Estimated: 60 mL/min — ABNORMAL LOW (ref >60)
Glucose, Bld: 101 mg/dL — ABNORMAL HIGH (ref 70–99)
Potassium: 3.7 mmol/L (ref 3.5–5.1)
Sodium: 138 mmol/L (ref 135–145)

## 2022-08-14 LAB — CBG MONITORING, ED: Glucose-Capillary: 85 mg/dL (ref 70–99)

## 2022-08-14 LAB — PROTEIN C, TOTAL: Protein C, Total: 93 % (ref 60–150)

## 2022-08-14 NOTE — ED Provider Notes (Signed)
[facility] Provider Note   CSN: [id number] Arrival date & time: 08/14/22  [id number]     History  Chief Complaint  Patient presents with   Aphasia    Luke Jimenez is a 57 y.o. male.  Patient here after he developed some speech problems about an hour ago.  He was just discharged last night after being diagnosed with stroke.  Found to have cardiomyopathy.  He is on Eliquis, statin.  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  He woke up at 6 AM feeling fine and then about an hour ago he started to have some slurred speech.  Felt a little bit off balance with his walking.  He denies any chest pain or shortness of breath.  No weakness or numbness or vision changes.  The history is provided by the patient.       Home Medications Prior to Admission medications   Medication Sig Start Date End Date Taking? Authorizing Provider  apixaban (ELIQUIS) 5 MG TABS tablet Take 1 tablet (5 mg total) by mouth 2 (two) times daily. 08/13/22   [name], MD  carvedilol (COREG) 3.125 MG tablet Take 1 tablet (3.125 mg total) by mouth 2 (two) times daily with a meal. HOLD for SBP less than 90 08/13/22   Darrick Meigs, Marge Duncans, MD  fluticasone (FLONASE) 50 MCG/ACT nasal spray Place 1 spray into both nostrils daily as needed for allergies or rhinitis. Patient not taking: Reported on 08/09/2022    [provider]  lacosamide (VIMPAT) 200 MG TABS tablet Take 1 tablet (200 mg total) by mouth 2 (two) times daily. 08/13/22   [name], MD  levETIRAcetam (KEPPRA) 750 MG tablet Take 2 tablets (1,500 mg total) by mouth 2 (two) times daily. 08/13/22   [name], MD  rosuvastatin (CRESTOR) 20 MG tablet Take 1 tablet (20 mg total) by mouth daily. 08/14/22   [name], MD  thyroid [facility] THYROID) 30 MG tablet Take 1.5 tablets (45 mg total) by mouth daily before breakfast. 08/13/22   [name], MD      Allergies    Patient has no  known allergies.    Review of Systems   Review of Systems  Physical Exam Updated Vital Signs  ED Triage Vitals  Enc Vitals Group     BP 08/14/22 0927 (!) 129/95     Pulse Rate 08/14/22 0927 (!) 111     Resp 08/14/22 0927 18     Temp 08/14/22 0927 98.3 F (36.8 C)     Temp Source 08/14/22 0927 Oral     SpO2 08/14/22 0927 100 %     Weight 08/14/22 0916 140 lb (63.5 kg)     Height 08/14/22 0916 5\' 10"  (1.778 m)     Head Circumference --      Peak Flow --      Pain Score 08/14/22 0916 0     Pain Loc --      Pain Edu? --      Excl. in [place]? --     Physical Exam Vitals and nursing note reviewed.  Constitutional:      General: He is not in acute distress.    Appearance: He is well-developed. He is not ill-appearing.  HENT:     Head: Normocephalic and atraumatic.     Nose: Nose normal.     Mouth/Throat:     Mouth: Mucous membranes are  moist.  Eyes:     Extraocular Movements: Extraocular movements intact.     Conjunctiva/sclera: Conjunctivae normal.     Pupils: Pupils are equal, round, and reactive to light.  Cardiovascular:     Rate and Rhythm: Normal rate and regular rhythm.     Pulses: Normal pulses.     Heart sounds: Normal heart sounds. No murmur heard. Pulmonary:     Effort: Pulmonary effort is normal. No respiratory distress.     Breath sounds: Normal breath sounds.  Abdominal:     Palpations: Abdomen is soft.     Tenderness: There is no abdominal tenderness.  Musculoskeletal:        General: No swelling.     Cervical back: Normal range of motion and neck supple.  Skin:    General: Skin is warm and dry.     Capillary Refill: Capillary refill takes less than 2 seconds.  Neurological:     General: No focal deficit present.     Mental Status: He is alert.     Comments: 5+ out of 5 strength throughout, normal sensation, no drift, normal finger-nose-finger, speech is fairly fluent, no obvious slurred speech or aphasia, visual fields intact, no neglect  Psychiatric:         Mood and Affect: Mood normal.     ED Results / Procedures / Treatments   Labs (all labs ordered are listed, but only abnormal results are displayed) Labs Reviewed  BASIC METABOLIC PANEL - Abnormal; Notable for the following components:      Result Value   Glucose, Bld 101 (*)    Creatinine, Ser 1.37 (*)    GFR, Estimated 60 (*)    All other components within normal limits  CBC WITH DIFFERENTIAL/PLATELET  CBG MONITORING, ED    EKG EKG Interpretation  Date/Time:  Saturday August 14 2022 09:02:40 EDT Ventricular Rate:  100 PR Interval:  177 QRS Duration: 179 QT Interval:  415 QTC Calculation: 536 R Axis:   99 Text Interpretation: Ventricular-paced complexes No further analysis attempted due to paced rhythm Confirmed by Lennice Sites ([id number]) on 08/14/2022 9:07:31 AM  Radiology CT Head Wo Contrast  Result Date: 08/14/2022 CLINICAL DATA:  Slurred speech and imbalance. EXAM: CT HEAD WITHOUT CONTRAST TECHNIQUE: Contiguous axial images were obtained from the base of the skull through the vertex without intravenous contrast. RADIATION DOSE REDUCTION: This exam was performed according to the departmental dose-optimization program which includes automated exposure control, adjustment of the mA and/or kV according to patient size and/or use of iterative reconstruction technique. COMPARISON:  Brain MRI from 3 days ago FINDINGS: Brain: Cortically based infarct in the left occipital lobe with mild petechial hemorrhage is non progressed. Infarction at the upper left thalamus and caudate body which is also known. No evidence of new infarct or progressive hemorrhage. No hydrocephalus or shift. Vascular: No hyperdense vessel or unexpected calcification. Skull: Normal. Negative for fracture or focal lesion. Sinuses/Orbits: No acute finding. IMPRESSION: No progression of left cerebral infarcts with mild petechial hemorrhage. Electronically Signed   By: Jorje Guild M.D.   On: 08/14/2022 10:56    ECHO TEE  Result Date: 08/13/2022    TRANSESOPHOGEAL ECHO REPORT   Patient Name:   LAVERL ANGERS Date of Exam: 08/13/2022 Medical Rec #:  [id number]   Height:       70.0 in Accession #:    [id number]  Weight:       142.0 lb Date of Birth:  May 11, 1966    BSA:  1.805 m Patient Age:    75 years    BP:           101/76 mmHg Patient Gender: M           HR:           106 bpm. Exam Location:  Inpatient Procedure: Transesophageal Echo, Cardiac Doppler, Color Doppler and Saline            Contrast Bubble Study Indications:     Stroke  History:         Patient has prior history of Echocardiogram examinations, most                  recent 09/06/2019. Abnormal ECG, Stroke; Arrythmias:AV Block.  Sonographer:     Roseanna Rainbow RDCS Referring Phys:  [place] Diagnosing Phys: Rudean Haskell MD PROCEDURE: After discussion of the risks and benefits of a TEE, an informed consent was obtained from the patient. The transesophogeal probe was passed without difficulty through the esophogus of the patient. Imaged were obtained with the patient in a left lateral decubitus position. Sedation performed by different physician. The patient was monitored while under deep sedation. Anesthestetic sedation was provided intravenously by Anesthesiology: 427mg  of Propofol. The patient's vital signs; including heart rate, blood pressure, and oxygen saturation; remained stable throughout the procedure. The patient developed no complications during the procedure.  IMPRESSIONS  1. Left ventricular ejection fraction, by estimation, is 20 to 25%. The left ventricle has severely decreased function.  2. Right ventricular systolic function is normal. The right ventricular size is not well visualized.  3. No left atrial/left atrial appendage thrombus was detected.  4. A small pericardial effusion is present. The pericardial effusion is posterior to the left ventricle.  5. The mitral valve is grossly normal. Mild mitral valve regurgitation. No  evidence of mitral stenosis.  6. The aortic valve is tricuspid. Aortic valve regurgitation is not visualized. No aortic stenosis is present.  7. Agitated saline contrast bubble study was negative, with no evidence of any interatrial shunt. Given clinical syndorme and presence of lipomatous interatrial septum; three Bubble studdies performed with two Valsalva studies. FINDINGS  Left Ventricle: Left ventricular ejection fraction, by estimation, is 20 to 25%. The left ventricle has severely decreased function. The left ventricular internal cavity size was normal in size. Right Ventricle: The right ventricular size is not well visualized. Right vetricular wall thickness was not well visualized. Right ventricular systolic function is normal. Left Atrium: Left atrial size was normal in size. No left atrial/left atrial appendage thrombus was detected. Right Atrium: Right atrial size was normal in size. Pericardium: A small pericardial effusion is present. The pericardial effusion is posterior to the left ventricle. Mitral Valve: The mitral valve is grossly normal. Mild mitral valve regurgitation. No evidence of mitral valve stenosis. Tricuspid Valve: The tricuspid valve is normal in structure. Tricuspid valve regurgitation is not demonstrated. No evidence of tricuspid stenosis. Aortic Valve: The aortic valve is tricuspid. Aortic valve regurgitation is not visualized. No aortic stenosis is present. Pulmonic Valve: The pulmonic valve was grossly normal. Pulmonic valve regurgitation is mild. No evidence of pulmonic stenosis. Aorta: The aortic root and ascending aorta are structurally normal, with no evidence of dilitation. IAS/Shunts: The interatrial septum appears to be lipomatous. No atrial level shunt detected by color flow Doppler. Agitated saline contrast was given intravenously to evaluate for intracardiac shunting. Agitated saline contrast bubble study was negative,  with no evidence of any interatrial  shunt.  Additional Comments: A device lead is visualized in the right atrium and right ventricle. Spectral Doppler performed. MR Peak grad:    57.8 mmHg MR Mean grad:    36.5 mmHg MR Vmax:         380.00 cm/s MR Vmean:        284.5 cm/s MR PISA:         1.01 cm MR PISA Eff ROA: 9 mm MR PISA Radius:  0.40 cm Rudean Haskell MD Electronically signed by Rudean Haskell MD Signature Date/Time: 08/13/2022/4:15:23 PM    Final    CT Angio Chest Pulmonary Embolism (PE) W or WO Contrast  Result Date: 08/12/2022 CLINICAL DATA:  Altered mental status.  Possible pulmonary shunt. EXAM: CT ANGIOGRAPHY CHEST WITH CONTRAST TECHNIQUE: Multidetector CT imaging of the chest was performed using the standard protocol during bolus administration of intravenous contrast. Multiplanar CT image reconstructions and MIPs were obtained to evaluate the vascular anatomy. RADIATION DOSE REDUCTION: This exam was performed according to the departmental dose-optimization program which includes automated exposure control, adjustment of the mA and/or kV according to patient size and/or use of iterative reconstruction technique. CONTRAST:  7mL OMNIPAQUE IOHEXOL 350 MG/ML SOLN COMPARISON:  None Available. FINDINGS: Cardiovascular: The heart is normal in size. No pericardial effusion. The aorta is normal in caliber. No atherosclerotic calcifications. The pulmonary arterial tree is well opacified. No filling defects to suggest pulmonary embolism. The pulmonary veins appear normal. Probable reflux of contrast down the azygous system. Pacer wires in place without complicating features. There is a smoothly marginated well circumscribed 3.7 cm lesion at the right lung base adjacent to the right ventricle which measures water attenuation and is consistent with a benign epicardial cyst. Mediastinum/Nodes: No mediastinal or hilar mass or lymphadenopathy. The esophagus is unremarkable. Lungs/Pleura: No acute pulmonary findings. No pulmonary edema,  pulmonary lesions or pleural effusion. Mild apical scarring changes. The central tracheobronchial tree is unremarkable. Upper Abdomen: No significant upper abdominal findings. Simple hepatic cysts are noted. High attenuation material in the gallbladder could be related to vicarious excretion of contrast material from prior contrast enhanced study from yesterday. Musculoskeletal: No significant bony findings. Review of the MIP images confirms the above findings. IMPRESSION: 1. No CT findings for pulmonary embolism. 2. Normal thoracic aorta. 3. No acute pulmonary findings. 4. 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 Electronically Signed   By: Marijo Sanes M.D.   On: 08/12/2022 18:19   VAS [place] TRANSCRANIAL DOPPLER W BUBBLES  Result Date: 08/12/2022  Transcranial Doppler with Bubble Patient Name:  HALLARD MCCULLAR  Date of Exam:   08/12/2022 Medical Rec #: [id number]    Accession #:    [id number] Date of Birth: [birth year]     Patient Gender: M Patient Age:   30 years Exam Location[facility]  [facility] Procedure:      VAS [place] TRANSCRANIAL DOPPLER W BUBBLES Referring Phys: Lovey Newcomer --------------------------------------------------------------------------------  Indications: Stroke. History: Recent extended travel. Performing Technologist: Darlin Coco RDMS, RVT  Examination Guidelines: A complete evaluation includes B-mode imaging, spectral Doppler, color Doppler, and power Doppler as needed of all accessible portions of each vessel. Bilateral testing is considered an integral part of a complete examination. Limited examinations for reoccurring indications may be performed as noted.  Summary:  A vascular evaluation was performed. The left middle cerebral artery was studied. An IV was inserted into the patient's right forearm. Verbal informed consent was obtained.  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  Diagnosing physician: Antony Contras MD Electronically signed by Antony Contras MD on 08/12/2022 at 5:08:45 PM.    Final     Procedures Procedures    Medications Ordered in ED Medications - No data to display  ED Course/ Medical Decision Making/ A&P                             Medical Decision Making Amount and/or Complexity of Data Reviewed Labs: ordered. Radiology: ordered.   Zayn Clemence is here with speech slurred.  Normal vitals.  No fever.  Patient discharged yesterday after being admitted for stroke.  He is on Eliquis.  Has been compliant with his medications.  He woke up at 6 AM this morning feeling okay and then around an hour ago he started to have some slurred speech.  He is also been on 2 antiseizure medicines now.  Speech has pretty much improved.  He denies any chest pain or shortness of breath.  His neurologic exam is normal.  Differential diagnosis possibly that this is a another small seizure focus, seems less likely to be another stroke.  I have talked with Dr. Luevenia Maxin with neurology who came down to evaluate the patient.  Ultimately we will get basic labs get a head CT but unless he exhibits any kind of major seizure activity anticipate discharge to home.  He is already optimally medically managed.  Per my review interpretation labs no significant anemia or electrolyte abnormality or kidney injury.  Head CT is unremarkable per radiology report.  Ultimately patient is doing really well.  Cleared by neurology to follow-up outpatient.  Discharged in good condition.  This chart was dictated using voice recognition software.  Despite best efforts to proofread,  errors can occur which can change the documentation meaning.         Final Clinical Impression(s) / ED Diagnoses Final diagnoses:  Weakness  Slurred speech    Rx / DC Orders ED  Discharge Orders     None         Lennice Sites, DO 08/14/22 1113

## 2022-08-14 NOTE — ED Triage Notes (Addendum)
Patient was seen in the ED on Monday for a code stroke was discharged from hospital  yest. States MRI showed that he had , had a left  sided stroke. Didn't have any physical findings this am woke up at 0600 was fine, approx. 0820 wife states patient had slurred speech and patient states his balance was off. , states the slurred speech comes and goes. Wife states patient is having some short term memory loss

## 2022-08-14 NOTE — Anesthesia Postprocedure Evaluation (Signed)
Anesthesia Post Note  Patient: Luke Jimenez  Procedure(s) Performed: TRANSESOPHAGEAL ECHOCARDIOGRAM (TEE) BUBBLE STUDY     Patient location during evaluation: PACU Anesthesia Type: MAC Level of consciousness: awake Pain management: pain level controlled Vital Signs Assessment: post-procedure vital signs reviewed and stable Respiratory status: spontaneous breathing Cardiovascular status: stable Postop Assessment: no apparent nausea or vomiting Anesthetic complications: no  No notable events documented.  Last Vitals:  Vitals:   08/13/22 1342 08/13/22 1550  BP: 110/82 110/79  Pulse: 98 (!) 102  Resp: 16 19  Temp: 36.6 C 36.7 C  SpO2: 99% 100%    Last Pain:  Vitals:   08/13/22 1550  TempSrc: Oral  PainSc:                  [name]

## 2022-08-16 ENCOUNTER — Encounter (HOSPITAL_COMMUNITY): Payer: Self-pay | Admitting: Internal Medicine

## 2022-08-25 ENCOUNTER — Telehealth: Payer: Self-pay | Admitting: *Deleted

## 2022-08-25 LAB — PROTHROMBIN GENE MUTATION

## 2022-08-25 NOTE — Telephone Encounter (Signed)
   Pre-operative Risk Assessment    Patient Name: Luke Jimenez  DOB: March 24, 1966 MRN: [id number]     Request for Surgical Clearance    Procedure:   DENTAL CLEANING, EXAM AND DENTAL X-RAYS  Date of Surgery:  Clearance TBD                                 Surgeon:  DR. Luellen Pucker, DDS Surgeon's Group or Practice Name:  [facility] P[facility] D[facility] Phone number:  [phone] Fax number:  [phone]   Type of Clearance Requested:   - Medical  - Pharmacy:  Hold Apixaban (Eliquis)     Type of Anesthesia:  Not Indicated   Additional requests/questions:    [name]   08/25/2022, 2:59 PM

## 2022-08-25 NOTE — Telephone Encounter (Signed)
   Patient Name: Luke Jimenez  DOB: [birth year] MRN: [id number]  Primary Cardiologist: Donato Schultz, MD  Chart reviewed as part of pre-operative protocol coverage.   Dental cleanings are considered low risk procedures per guidelines and generally do not require any specific cardiac clearance. It is also generally accepted that for dental cleanings there is no need to interrupt blood thinner therapy.  SBE prophylaxis is  not required for the patient from a cardiac standpoint.  I will route this recommendation to the requesting party via Epic fax function and remove from pre-op pool.  Please call with questions.  Carlos Levering, NP 08/25/2022, 4:34 PM

## 2022-08-26 LAB — FACTOR 5 LEIDEN

## 2022-09-02 ENCOUNTER — Ambulatory Visit: Payer: No Typology Code available for payment source | Admitting: Cardiovascular Disease

## 2022-09-06 ENCOUNTER — Ambulatory Visit
Admit: 2022-09-06 | Discharge: 2022-09-06 | Disposition: A | Discharge status: Home / Self Care | Payer: No Typology Code available for payment source | Attending: Physician Assistant | Admitting: Physician Assistant

## 2022-09-06 ENCOUNTER — Encounter (HOSPITAL_COMMUNITY): Payer: Self-pay

## 2022-09-06 ENCOUNTER — Telehealth: Payer: Self-pay | Admitting: Neurology

## 2022-09-06 ENCOUNTER — Encounter (HOSPITAL_COMMUNITY): Payer: No Typology Code available for payment source

## 2022-09-06 VITALS — BP 124/100 | HR 103 | Wt 143.0 lb

## 2022-09-06 DIAGNOSIS — I63432 Cerebral infarction due to embolism of left posterior cerebral artery: Secondary | ICD-10-CM | POA: Diagnosis not present

## 2022-09-06 DIAGNOSIS — E782 Mixed hyperlipidemia: Secondary | ICD-10-CM

## 2022-09-06 DIAGNOSIS — Z79899 Other long term (current) drug therapy: Secondary | ICD-10-CM | POA: Diagnosis not present

## 2022-09-06 DIAGNOSIS — I441 Atrioventricular block, second degree: Secondary | ICD-10-CM

## 2022-09-06 DIAGNOSIS — I5022 Chronic systolic (congestive) heart failure: Secondary | ICD-10-CM | POA: Diagnosis not present

## 2022-09-06 DIAGNOSIS — Z950 Presence of cardiac pacemaker: Secondary | ICD-10-CM | POA: Insufficient documentation

## 2022-09-06 DIAGNOSIS — I959 Hypotension, unspecified: Secondary | ICD-10-CM | POA: Diagnosis not present

## 2022-09-06 DIAGNOSIS — E785 Hyperlipidemia, unspecified: Secondary | ICD-10-CM | POA: Diagnosis not present

## 2022-09-06 DIAGNOSIS — R001 Bradycardia, unspecified: Secondary | ICD-10-CM | POA: Diagnosis not present

## 2022-09-06 DIAGNOSIS — E039 Hypothyroidism, unspecified: Secondary | ICD-10-CM | POA: Diagnosis not present

## 2022-09-06 DIAGNOSIS — R5383 Other fatigue: Secondary | ICD-10-CM | POA: Diagnosis not present

## 2022-09-06 DIAGNOSIS — Z8673 Personal history of transient ischemic attack (TIA), and cerebral infarction without residual deficits: Secondary | ICD-10-CM | POA: Insufficient documentation

## 2022-09-06 DIAGNOSIS — G40909 Epilepsy, unspecified, not intractable, without status epilepticus: Secondary | ICD-10-CM | POA: Diagnosis not present

## 2022-09-06 DIAGNOSIS — I429 Cardiomyopathy, unspecified: Secondary | ICD-10-CM | POA: Insufficient documentation

## 2022-09-06 DIAGNOSIS — R000 Tachycardia, unspecified: Secondary | ICD-10-CM | POA: Insufficient documentation

## 2022-09-06 DIAGNOSIS — Z7901 Long term (current) use of anticoagulants: Secondary | ICD-10-CM | POA: Diagnosis not present

## 2022-09-06 DIAGNOSIS — I509 Heart failure, unspecified: Secondary | ICD-10-CM | POA: Insufficient documentation

## 2022-09-06 DIAGNOSIS — I5020 Unspecified systolic (congestive) heart failure: Secondary | ICD-10-CM

## 2022-09-06 LAB — BASIC METABOLIC PANEL
Anion gap: 7 (ref 5–15)
BUN: 28 mg/dL — ABNORMAL HIGH (ref 6–20)
CO2: 28 mmol/L (ref 22–32)
Calcium: 9 mg/dL (ref 8.9–10.3)
Chloride: 101 mmol/L (ref 98–111)
Creatinine, Ser: 1 mg/dL (ref 0.61–1.24)
GFR, Estimated: >60 mL/min (ref >60)
Glucose, Bld: 109 mg/dL — ABNORMAL HIGH (ref 70–99)
Potassium: 4.5 mmol/L (ref 3.5–5.1)
Sodium: 136 mmol/L (ref 135–145)

## 2022-09-06 MED ORDER — DAPAGLIFLOZIN PROPANEDIOL 10 MG PO TABS: 10.0000 mg | ORAL_TABLET | Freq: Every day | ORAL | 11 refills | Status: DC

## 2022-09-06 NOTE — Telephone Encounter (Signed)
I faxed pt Luke Jimenez form on 09/06/22

## 2022-09-06 NOTE — Patient Instructions (Addendum)
EKG done today.  Labs done today. We will contact you only if your labs are abnormal.  START Marcelline Deist  (1 tablet) by mouth daily.   No other medication changes were made. Please continue all current medications as prescribed.  Your physician recommends that you schedule a follow-up appointment in May with Dr. Shirlee Latch here in our office.    If you have any questions or concerns before your next appointment please send [place] a message through [place] or call our office at [phone].    TO LEAVE A MESSAGE FOR THE NURSE SELECT OPTION 2, PLEASE LEAVE A MESSAGE INCLUDING: YOUR NAME DATE OF BIRTH CALL BACK NUMBER REASON FOR CALL**this is important as we prioritize the call backs  YOU WILL RECEIVE A CALL BACK THE SAME DAY AS LONG AS YOU CALL BEFORE 4:00 PM   Do the following things EVERYDAY: Weigh yourself in the morning before breakfast. Write it down and keep it in a log. Take your medicines as prescribed Eat low salt foods--Limit salt (sodium) to 2000 mg per day.  Stay as active as you can everyday Limit all fluids for the day to less than 2 liters   At the [facility], you and your health needs are our priority. As part of our continuing mission to provide you with exceptional heart care, we have created designated Provider Care Teams. These Care Teams include your primary Cardiologist (physician) and Advanced Practice Providers (APPs- Physician Assistants and Nurse Practitioners) who all work together to provide you with the care you need, when you need it.   You may see any of the following providers on your designated Care Team at your next follow up: Dr Arvilla Meres Dr Marca Ancona Dr. Marcos Eke, NP Robbie Lis, [place] [facility] [place], [place] Brynda Peon, NP Karle Plumber, PharmD   Please be sure to bring in all your medications bottles to every appointment.    Thank you for choosing [facility]

## 2022-09-06 NOTE — Progress Notes (Signed)
HEART & VASCULAR TRANSITION OF CARE CONSULT NOTE     Referring Physician: Dr. Sharl Ma Primary Care: Dr. Deeann Dowse Primary Cardiologist: Dr. Anne Fu  HPI: Referred to clinic by Dr. Sharl Ma with [facility] for heart failure consultation. 57 y.o. 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 No other prior cardiac history.   He was admitted 08/09/22 with acute PCA stroke. Presented with word finding difficulty and forgetfulness. Echo demonstrated EF 20-25%, RV okay, mild MR. Transcranial dopper at rest and with valsava + for large right to left shunt (felt to be false +). Cardiology consulted for new cardiomyopathy. TEE with EF 20-25%, no left atrial appendage thrombus, negative bubble study. Started Coreg. GDMT limited by hypotension. Plan to pursue further workup for CM as outpatient. There was concern cardiomyopathy may be pacer induced given high burden of RV pacing. He was scheduled for follow-up to discuss possible left bundle pacing versus device upgrade.  He is here today for hospital follow-up. Patient has been feeling well since discharge other than fatigue. Denies dyspnea, orthopnea, PND or lower extremity edema. Home weight stable around 136 lb. He is quite active and walked about 15,000 steps a day prior to his stroke, now walking about 5,000 steps a day. Has some residual memory and language difficulty from stroke.  Works full-time at [employer]. Currently on short-term disability.  Denies any tobacco use.  No family hx of heart failure or CAD.   Review of Systems: [y] = yes,  = no   General: Weight gain ; Weight loss ; Anorexia ; Fatigue [Y]; Fever ; Chills ; Weakness   Cardiac: Chest pain/pressure ; Resting SOB ; Exertional SOB ; Orthopnea ; Pedal Edema ; Palpitations ; Syncope ; Presyncope ; Paroxysmal nocturnal dyspnea[ ]   Pulmonary: Cough ; Wheezing[  ]; Hemoptysis[ ] ; Sputum ; Snoring   GI: Vomiting[ ] ; Dysphagia[ ] ; Melena[ ] ; Hematochezia ; Heartburn[ ] ; Abdominal pain ; Constipation ; Diarrhea ; BRBPR   GU: Hematuria[ ] ; Dysuria ; Nocturia[ ]   Vascular: Pain in legs with walking ; Pain in feet with lying flat ; Non-healing sores ; Stroke [Y]; TIA ; Slurred speech ;  Neuro: Headaches[ ] ; Vertigo[ ] ; Seizures[ ] ; Paresthesias[ ] ;Blurred vision ; Diplopia ; Vision changes   Ortho/Skin: Arthritis ; Joint pain ; Muscle pain ; Joint swelling ; Back Pain ; Rash   Psych: Depression[ ] ; Anxiety[ ]   Heme: Bleeding problems ; Clotting disorders ; Anemia   Endocrine: Diabetes ; Thyroid dysfunction[Y]   Past Medical History:  Diagnosis Date   Bradycardia    Second degree AV block    Stroke     Current Outpatient Medications  Medication Sig Dispense Refill   apixaban (ELIQUIS) 5 MG TABS tablet Take 1 tablet (5 mg total) by mouth 2 (two) times daily. 60 tablet 3   Ascorbic Acid (VITAMIN C) 1000 MG tablet Take 1,000 mg by mouth 2 (two) times daily at 10 AM and 5 PM.     carvedilol (COREG) 3.125 MG tablet Take 1 tablet (3.125 mg total) by mouth 2 (two) times daily with a meal. HOLD for SBP less than 90 60 tablet 2   dapagliflozin propanediol (FARXIGA) 10 MG TABS tablet  Take 1 tablet (10 mg total) by mouth daily before breakfast. 30 tablet 11   lacosamide (VIMPAT) 200 MG TABS tablet Take 1 tablet (200 mg total) by mouth 2 (two) times daily. 60 tablet 2   levETIRAcetam (KEPPRA) 750 MG tablet Take 2 tablets (1,500 mg total) by mouth 2 (two) times daily. 120 tablet 2   thyroid (ARMOUR) 60 MG tablet Take 60 mg by mouth daily before breakfast.     VITAMIN D, CHOLECALCIFEROL, PO Take 10,000 Units by mouth daily.     No current facility-administered medications for this encounter.    No Known Allergies    Social History   Socioeconomic History   Marital status: Married     Spouse name: [name]   Number of children: 2   Years of education: Not on file   Highest education level: Bachelor's degree (e.g., BA, AB, BS)  Occupational History   Occupation: [employer]  Tobacco Use   Smoking status: Never    Passive exposure: Never   Smokeless tobacco: Never  Vaping Use   Vaping Use: Never used  Substance and Sexual Activity   Alcohol use: Yes    Comment: 1 drink a week   Drug use: Never   Sexual activity: Not on file  Other Topics Concern   Not on file  Social History Narrative   Not on file   Social Determinants of Health   Financial Resource Strain: Low Risk  (08/13/2022)   Overall Financial Resource Strain (CARDIA)    Difficulty of Paying Living Expenses: Not hard at all  Food Insecurity: No Food Insecurity (08/13/2022)   Hunger Vital Sign    Worried About Running Out of Food in the Last Year: Never true    Ran Out of Food in the Last Year: Never true  Transportation Needs: No Transportation Needs (08/13/2022)   PRAPARE - Administrator, Civil Service (Medical): No    Lack of Transportation (Non-Medical): No  Physical Activity: Not on file  Stress: Not on file  Social Connections: Not on file  Intimate Partner Violence: Not At Risk (08/10/2022)   Humiliation, Afraid, Rape, and Kick questionnaire    Fear of Current or Ex-Partner: No    Emotionally Abused: No    Physically Abused: No    Sexually Abused: No      Family History  Problem Relation Age of Onset   Heart disease Maternal Grandfather     Vitals:   09/06/22 1358  BP: (!) 124/100  Pulse: (!) 103  SpO2: 100%  Weight: 64.9 kg (143 lb)    PHYSICAL EXAM: General:  Well appearing. Ambulated into clinic. HEENT: normal Neck: supple. no JVD. Carotids 2+ bilat; no bruits. No lymphadenopathy or thryomegaly appreciated. Cor: PMI nondisplaced. Regular rhythm, tachy. No rubs, gallops or murmurs. Lungs: clear Abdomen: soft, nontender, nondistended. Extremities: no  cyanosis, clubbing, rash, edema Neuro: alert & oriented x 3. Affect pleasant.  ZOX:WRUEAV 99 bpm  Labs 04/18: Cr. 1.02, Na 141, K 5.3   ASSESSMENT & PLAN: HFrEF -New diagnosis -Echo 04/21: EF 55-60% -Echo 03/24: EF 20-25%, RV okay -Etiology possibly high burden of RV pacing. Already has an appointment with EP for consideration of left bundle pacing and possible device upgrade. Ischemic less likely but may need cardiac cath to exclude. Infiltrative also a consideration in view of his history of conduction system disease. Pacemaker may prohibit cardiac MRI. NYHA II GDMT  Diuretic-N/a BB-Coreg 3.125 mg BID Ace/ARB/ARNI-Not yet, BP okay today but has  otherwise been soft. Add next if BP allows. MRA-Recent K 5.3, has not historically been elevated. Check BMET today. If K okay will add spironolactone. 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% -Anticoagulated with Eliquis 5 mg BID -Has f/u with Neurology next month  Seizure disorder -Noted during recent admission -Now on Keppra and Vimpat  Hyperlipidemia -Started on Crestor during recent admission -Not currently taking d/t concern for side effects  Symptomatic 2nd degree AV block -S/p PPM in 2021   Referred to HFSW (PCP, Medications, Transportation, ETOH Abuse, Drug Abuse, Insurance, Financial ): No Refer to Pharmacy: No Refer to Home Health: No Refer to Advanced Heart Failure Clinic: Yes Refer to General Cardiology: No  Follow up: EP as scheduled on 04/23, 05/02 to establish with Dr. Shirlee Latch

## 2022-09-06 NOTE — Telephone Encounter (Signed)
I have provided provided the LTD forms to Dr Pearlean Brownie and he has completed and signed. I will provide to Stanton Kidney in medical records to send for the pt.

## 2022-09-07 ENCOUNTER — Telehealth (HOSPITAL_COMMUNITY): Payer: Self-pay | Admitting: Cardiology

## 2022-09-07 ENCOUNTER — Encounter: Payer: Self-pay | Admitting: Cardiovascular Disease

## 2022-09-07 ENCOUNTER — Ambulatory Visit: Payer: No Typology Code available for payment source | Admitting: Cardiovascular Disease

## 2022-09-07 VITALS — BP 124/86 | HR 101 | Ht 70.0 in | Wt 142.6 lb

## 2022-09-07 DIAGNOSIS — I441 Atrioventricular block, second degree: Secondary | ICD-10-CM | POA: Diagnosis not present

## 2022-09-07 DIAGNOSIS — Z950 Presence of cardiac pacemaker: Secondary | ICD-10-CM

## 2022-09-07 DIAGNOSIS — I639 Cerebral infarction, unspecified: Secondary | ICD-10-CM

## 2022-09-07 DIAGNOSIS — I5020 Unspecified systolic (congestive) heart failure: Secondary | ICD-10-CM

## 2022-09-07 DIAGNOSIS — I6310 Cerebral infarction due to embolism of unspecified precerebral artery: Secondary | ICD-10-CM

## 2022-09-07 LAB — CUP PACEART INCLINIC DEVICE CHECK
Battery Remaining Longevity: 100 mo
Battery Voltage: 2.98 V
Brady Statistic AP VP Percent: 0.01 %
Brady Statistic AP VS Percent: 0 %
Brady Statistic AS VP Percent: 99.75 %
Brady Statistic AS VS Percent: 0.24 %
Brady Statistic RA Percent Paced: 0.01 %
Brady Statistic RV Percent Paced: 99.76 %
Date Time Interrogation Session: 20240423170554
Implantable Lead Connection Status: 753985
Implantable Lead Connection Status: 753985
Implantable Lead Implant Date: 20210805
Implantable Lead Implant Date: 20210805
Implantable Lead Location: 753859
Implantable Lead Location: 753860
Implantable Lead Model: 3830
Implantable Lead Model: 5076
Implantable Pulse Generator Implant Date: 20210805
Lead Channel Impedance Value: 361 Ohm
Lead Channel Impedance Value: 399 Ohm
Lead Channel Impedance Value: 475 Ohm
Lead Channel Impedance Value: 589 Ohm
Lead Channel Pacing Threshold Amplitude: 0.625 V
Lead Channel Pacing Threshold Amplitude: 0.75 V
Lead Channel Pacing Threshold Amplitude: 1.25 V
Lead Channel Pacing Threshold Pulse Width: 0.4 ms
Lead Channel Pacing Threshold Pulse Width: 0.4 ms
Lead Channel Pacing Threshold Pulse Width: 0.4 ms
Lead Channel Sensing Intrinsic Amplitude: 2.875 mV
Lead Channel Sensing Intrinsic Amplitude: 3.5 mV
Lead Channel Sensing Intrinsic Amplitude: 7.375 mV
Lead Channel Sensing Intrinsic Amplitude: 8.125 mV
Lead Channel Setting Pacing Amplitude: 1.5 V
Lead Channel Setting Pacing Amplitude: 2.5 V
Lead Channel Setting Pacing Pulse Width: 0.4 ms
Lead Channel Setting Sensing Sensitivity: 0.9 mV
Zone Setting Status: 755011

## 2022-09-07 MED ORDER — SPIRONOLACTONE 25 MG PO TABS: 12.5000 mg | ORAL_TABLET | Freq: Every day | ORAL | 3 refills | Status: DC

## 2022-09-07 NOTE — Telephone Encounter (Signed)
-----   Message from Andrey Farmer, [place] sent at 09/06/2022  4:39 PM EDT ----- BMET okay. Add spiro 12.5 mg daily. Will need repeat BMET at f/u next week.

## 2022-09-07 NOTE — Progress Notes (Signed)
Electrophysiology Office Note:    Date:  09/07/2022   ID:  Luke Jimenez, DOB [birth year], MRN [id number]  PCP:  Shon Hale, MD   [facility] Providers Cardiologist:  Donato Schultz, MD Electrophysiologist:  Hillis Range, MD (Inactive)     Referring MD: Shon Hale, *   History of Present Illness:    Luke Jimenez is a 57 y.o. male with a hx listed below, significant for bradycardia with pacemaker in place, referred for arrhythmia management.  He was found to have 2-1 AV block and paroxysmal pauses of greater than 4 seconds prior to a pacemaker placement in August 2021 by Dr. Johney Frame.  A left bundle position lead was placed at that time.  He was admitted in March 2024 with vague symptoms concerning for stroke.  During the admission he was found to have an EF of 20 to 25%.  Coreg 3.125 mg twice daily was started prior to discharge.  He was started on dapagliflozin 10, spironolactone 25 yesterday.  He has an appointment with Dr. Shirlee Latch in the next week.  Past Medical History:  Diagnosis Date   Bradycardia    Second degree AV block    Stroke     Past Surgical History:  Procedure Laterality Date   BUBBLE STUDY  08/13/2022   Procedure: BUBBLE STUDY;  Surgeon: Christell Constant, MD;  Location: [facility] ENDOSCOPY;  Service: Cardiovascular;;   PACEMAKER IMPLANT N/A 12/20/2019   Procedure: PACEMAKER IMPLANT;  Surgeon: Hillis Range, MD;  Location: [facility] INVASIVE CV LAB;  Service: Cardiovascular;  Laterality: N/A;   TEE WITHOUT CARDIOVERSION N/A 08/13/2022   Procedure: TRANSESOPHAGEAL ECHOCARDIOGRAM (TEE);  Surgeon: Christell Constant, MD;  Location: [facility] ENDOSCOPY;  Service: Cardiovascular;  Laterality: N/A;    Current Medications: Current Meds  Medication Sig   apixaban (ELIQUIS) 5 MG TABS tablet Take 1 tablet (5 mg total) by mouth 2 (two) times daily.   Ascorbic Acid (VITAMIN C) 1000 MG tablet Take 1,000 mg by mouth 2 (two) times daily at 10 AM and 5 PM.    carvedilol (COREG) 3.125 MG tablet Take 1 tablet (3.125 mg total) by mouth 2 (two) times daily with a meal. HOLD for SBP less than 90   dapagliflozin propanediol (FARXIGA) 10 MG TABS tablet Take 1 tablet (10 mg total) by mouth daily before breakfast.   lacosamide (VIMPAT) 200 MG TABS tablet Take 1 tablet (200 mg total) by mouth 2 (two) times daily.   levETIRAcetam (KEPPRA) 750 MG tablet Take 2 tablets (1,500 mg total) by mouth 2 (two) times daily.   spironolactone (ALDACTONE) 25 MG tablet Take 0.5 tablets (12.5 mg total) by mouth daily.   thyroid (ARMOUR) 60 MG tablet Take 60 mg by mouth daily before breakfast.   VITAMIN D, CHOLECALCIFEROL, PO Take 10,000 Units by mouth daily.     Allergies:   Patient has no known allergies.   Social and Family History: Reviewed in Epic  ROS:   Please see the history of present illness.    All other systems reviewed and are negative.  EKGs/Labs/Other Studies Reviewed Today:    Echocardiogram:  08/12/22 EF 20-25% with global hypokinesis   Monitors:   Stress testing:   Advanced imaging:    EKG:  Last EKG results: yesterday - personally reviewed by me -- A-sensed, V-paced   Recent Labs: 08/09/2022: TSH 0.063 08/10/2022: ALT 11 08/13/2022: Magnesium 2.0 08/14/2022: Hemoglobin 16.4; Platelets 257 09/06/2022: BUN 28; Creatinine, Ser 1.00; Potassium 4.5; Sodium 136  Physical Exam:    VS:  BP 124/86   Pulse (!) 101   Ht  (1.778 m)   Wt 142 lb 9.6 oz (64.7 kg)   SpO2 98%   BMI 20.46 kg/m     Wt Readings from Last 3 Encounters:  09/07/22 142 lb 9.6 oz (64.7 kg)  09/06/22 143 lb (64.9 kg)  08/14/22 140 lb (63.5 kg)     GEN: Well nourished, well developed in no acute distress CARDIAC: RRR, no murmurs, rubs, gallops The device site is normal -- no tenderness, edema, drainage, redness, threatened erosion. RESPIRATORY:  Normal work of breathing MUSCULOSKELETAL: no edema    ASSESSMENT & PLAN:    Symptomatic second degree  AV block Normal device function LBB lead - LV activation is not optimal; QRS is fairly wide but LVAT appears ok. Risk of pacing-induced cardiomyopathy is not as clear as with RV apical pacing.  Medtronic dual chamber pacemaker RV lead is in the LBB position  CHFrEF Diagnosis at the time of an admission for stroke He has not fully been started on GDMT yet EF has not been reevaluated.  It has not been established definitively that his cardiomyopathy is due to ventricular pacing -- possible stress from stroke, ischemia has not been excluded. If EF remains less than 35%, he will need a defibrillator which will likely require extraction of his current left bundle branch lead with placement of a BiV device including a defibrillator lead and a left ventricular (CS) lead If his EF improves to greater than 35%, and no alternative cause for cardiomyopathy is identified, then we would plan to upgrade his device with a LV lead   I provided a patient with a flow chart of my management plan. We will need to reassess EF on medical therapy, possibly rule out ischemic disease. Device management will depend upon results of upcoming studies.     I will see again in about a month.  Medication Adjustments/Labs and Tests Ordered: Current medicines are reviewed at length with the patient today.  Concerns regarding medicines are outlined above.  No orders of the defined types were placed in this encounter.  No orders of the defined types were placed in this encounter.    Signed, Maurice Small, MD  09/07/2022 4:15 PM    [facility]

## 2022-09-07 NOTE — Patient Instructions (Signed)
Medication Instructions:  Your physician recommends that you continue on your current medications as directed. Please refer to the Current Medication list given to you today. *If you need a refill on your cardiac medications before your next appointment, please call your pharmacy*   Follow-Up: At [facility], you and your health needs are our priority.  As part of our continuing mission to provide you with exceptional heart care, we have created designated Provider Care Teams.  These Care Teams include your primary Cardiologist (physician) and Advanced Practice Providers (APPs -  Physician Assistants and Nurse Practitioners) who all work together to provide you with the care you need, when you need it.  We recommend signing up for the patient portal called "MyChart".  Sign up information is provided on this After Visit Summary.  MyChart is used to connect with patients for Virtual Visits (Telemedicine).  Patients are able to view lab/test results, encounter notes, upcoming appointments, etc.  Non-urgent messages can be sent to your provider as well.   To learn more about what you can do with MyChart, go to [email or web address].    Your next appointment:   1 month(s)  Provider:   York Pellant, MD

## 2022-09-07 NOTE — Telephone Encounter (Signed)
Patient called.  Patient aware via wife 

## 2022-09-08 ENCOUNTER — Other Ambulatory Visit: Payer: Self-pay

## 2022-09-08 ENCOUNTER — Ambulatory Visit: Payer: No Typology Code available for payment source

## 2022-09-08 ENCOUNTER — Ambulatory Visit: Payer: No Typology Code available for payment source | Attending: Family Medicine | Admitting: Occupational Therapy

## 2022-09-08 DIAGNOSIS — R41841 Cognitive communication deficit: Secondary | ICD-10-CM | POA: Insufficient documentation

## 2022-09-08 DIAGNOSIS — R41840 Attention and concentration deficit: Secondary | ICD-10-CM | POA: Diagnosis present

## 2022-09-08 DIAGNOSIS — I6310 Cerebral infarction due to embolism of unspecified precerebral artery: Secondary | ICD-10-CM | POA: Insufficient documentation

## 2022-09-08 DIAGNOSIS — R480 Dyslexia and alexia: Secondary | ICD-10-CM | POA: Diagnosis present

## 2022-09-08 DIAGNOSIS — R4701 Aphasia: Secondary | ICD-10-CM | POA: Insufficient documentation

## 2022-09-08 DIAGNOSIS — I69318 Other symptoms and signs involving cognitive functions following cerebral infarction: Secondary | ICD-10-CM | POA: Insufficient documentation

## 2022-09-08 NOTE — Therapy (Signed)
OUTPATIENT OCCUPATIONAL THERAPY NEURO EVALUATION  Patient Name: Luke Jimenez MRN: [id number] DOB:1965-11-09, 57 y.o., male Today's Date: 09/09/2022  PCP: Shon Hale, MD REFERRING PROVIDER: Meredeth Ide, MD  END OF SESSION:  OT End of Session - 09/09/22 1554     Visit Number 1    Number of Visits 13    Date for OT Re-Evaluation 11/05/22    Authorization Type [employer]    OT Start Time 1447    OT Stop Time 1535    OT Time Calculation (min) 48 min             Past Medical History:  Diagnosis Date   Bradycardia    Second degree AV block    Stroke    Past Surgical History:  Procedure Laterality Date   BUBBLE STUDY  08/13/2022   Procedure: BUBBLE STUDY;  Surgeon: Christell Constant, MD;  Location: [facility]OSCOPY;  Service: Cardiovascular;;   PACEMAKER IMPLANT N/A 12/20/2019   Procedure: PACEMAKER IMPLANT;  Surgeon: Hillis Range, MD;  Location: [facility]VASIVE [facility];  Service: Cardiovascular;  Laterality: N/A;   TEE WITHOUT CARDIOVERSION N/A 08/13/2022   Procedure: TRANSESOPHAGEAL ECHOCARDIOGRAM (TEE);  Surgeon: Christell Constant, MD;  Location: [facility] ENDOSCOPY;  Service: Cardiovascular;  Laterality: N/A;   Patient Active Problem List   Diagnosis Date Noted   Heart failure, type unknown 09/06/2022   Cardiomyopathy 08/13/2022   Cerebrovascular accident (CVA) due to embolism of precerebral artery 08/13/2022   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) 08/11/2022   Cryptogenic stroke 08/11/2022   Seizure 08/10/2022   AMS (altered mental status) 08/09/2022   ATRIOVENTRICULAR BLOCK, 2ND DEGREE 06/17/2010    ONSET DATE: 08/09/22  REFERRING DIAG: I63.10 (ICD-10-CM) - Cerebrovascular accident (CVA) due to embolism of precerebral artery  THERAPY DIAG:  Other symptoms and signs involving cognitive functions following cerebral infarction  Attention and concentration deficitLama, Sarina Ill, MD  Rationale for Evaluation and Treatment:  Rehabilitation  SUBJECTIVE:   SUBJECTIVE STATEMENT: Pt reports driving home from [place] and got up the next morning noticing increased confusion and difficulty signing in to his computer.  Pt's wife called a few hours later and wife noticed that he just sounded off.  Daughter is a Engineer, manufacturing systems and started asking some stroke protocol questions.  Daughter in law took pt to hospital where he was much more confused and disoriented by that time.  Pt's spouse reports difficulty with short term memory and procedural memory.  Reading is most significant impairment.  Pt was having difficulty with multi-step commands due to short term memory impairments.  Pt and spouse recognize improvements, but still room for additional improvement. Pt accompanied by: self and significant other (spouse [name])  PERTINENT HISTORY: Pt is a 57 y.o. male who presented 08/09/22 with speech difficulty and AMS. 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 EEG also showing seizures. PMH: second-degree AV block with pacemaker, bradycardia  PRECAUTIONS: None  WEIGHT BEARING RESTRICTIONS: No  PAIN:  Are you having pain? No  FALLS: Has patient fallen in last 6 months? No  LIVING ENVIRONMENT: Lives with: lives with their spouse and family nearby Lives in: House/apartment Stairs: Yes: Internal: bedroom/bathroom up full flight of steps; and External: 4-5 steps Has following equipment at home: Grab bars and built in shower seat, hand held shower head  PLOF: Independent, Independent with basic ADLs, and Vocation/Vocational requirements: Quarry manager   PATIENT GOALS: to improve in reading and short term memory  OBJECTIVE:   HAND DOMINANCE: Right  ADLs: Overall ADLs: Independent Transfers/ambulation related to ADLs: Independent Equipment: Grab bars and Walk in shower  IADLs: Shopping: has gone to store with spouse, with ability to walk away to retreive an item and  return to her  Meal Prep: microwave to warm up leftovers, but did not really cook before [employer] mobility: not cleared to drive Medication management: spouse is Control and instrumentation engineer: spouse takes care of and did before  MOBILITY STATUS: Independent  POSTURE COMMENTS:  No Significant postural limitations  ACTIVITY TOLERANCE: Activity tolerance: decreased endurance due to cardiac issues  FUNCTIONAL OUTCOME MEASURES: FOTO: eval 98, predicted 98  UPPER EXTREMITY ROM:  WFL bilaterally  UPPER EXTREMITY MMT:   5/5 overall  COORDINATION: Box and Blocks:  Right 49 blocks, Left 46 blocks  SENSATION: WFL  COGNITION: Overall cognitive status:  Impaired with significant impairments to time orientation, sequencing, attention, and short term memory/recall  VISION: Subjective report: no changes  VISION ASSESSMENT: To be further assessed in functional context While in hospital, OT noted questionable R inattention, to be further assessed in subsequent visits  Patient has difficulty with following activities due to following visual impairments: reading - unsure if attention, memory, or visual  TODAY'S TREATMENT:                                                                                           09/08/22 SLUMS: Pt scored 18/30 with deficits mostly in short term memory to name animals, recall words after <5 mins, and decreased orientation and sequencing with clock drawing.  Pt also requiring increased time for orientation to day and year but able to complete with increased time.  PATIENT EDUCATION: Education details: Educated on role and purpose of OT as well as potential interventions and goals for therapy based on initial evaluation findings. Person educated: Patient and Spouse Education method: Explanation Education comprehension: verbalized understanding and needs further education  HOME EXERCISE PROGRAM: TBD   GOALS: Goals reviewed with patient? Yes  SHORT  TERM GOALS: Target date: 10/08/22  Pt will verbalize understanding of task modifications and/or potential A/E needs to increase ease, safety, and independence w/ ADLs and IADLs. Baseline:  Goal status: INITIAL  2.  Pt will be able to complete simulated visual scanning activity w/ at least 95% accuracy (finding targets around therapy gym, searching for items on shelves), incorporating visual and memory compensatory strategies prn Baseline:  Goal status: INITIAL  3.  Pt will demonstrate improved memory, sequencing, and problem solving as demonstrated by improvements in score on pill box assessment by ability to complete in 5 mins time limit with </= to 3 errors. Baseline:  Goal status: INITIAL  4.   Pt will complete table top scanning activity with Supervision, min cues for recall of instructions, sequencing during task. Baseline:  Goal status: INITIAL  5.   Pt will be able to demonstrate ability to alternate attention for 5 mins during table top task. Baseline:  Goal status: INITIAL  LONG TERM GOALS: Target date: 11/05/22  Pt will demonstrate ability to sequence simple functional task (simple snack prep, laundry task, etc)  at Mod I level with good safety awareness and ability to utilize memory strategies for recall. Baseline:  Goal status: INITIAL  2.  Pt will navigate a moderately busy environment, completing dual task activity and/or following multi-step commands with 90% accuracy Baseline:  Goal status: INITIAL  3.  Pt will complete simulated medication management activity w/ Mod Ind by discharge, incorporating compensatory strategies/AE prn Baseline:  Goal status: INITIAL  4.  Pt will be independent with utilization of memory strategies to allow for increased independence with managing personal appointments. Baseline:  Goal status: INITIAL  5.  Pt will be able to demonstrate ability to divide attention for 5 mins during functional task. Baseline:  Goal status:  INITIAL   ASSESSMENT:  CLINICAL IMPRESSION: Patient is a 57 y.o. male who was seen today for occupational therapy evaluation for impairments s/p left PCA territory infarct with associated petechial hemorrhage. EEG also showing seizures. PMH: second-degree AV block with pacemaker, bradycardia. Pt currently lives with spouse in a 2 story home with no mobility concerns and was working as a Quarry manager prior to onset. Pt will benefit from skilled occupational therapy services to address cognition, safety awareness, introduction of compensatory strategies/AE prn, visual-perception, and implementation of an HEP to improve participation and safety during ADLs, IADLs, and safe return to work.   PERFORMANCE DEFICITS: in functional skills including IADLs, endurance, cardiopulmonary status limiting function, decreased knowledge of precautions, and decreased knowledge of use of DME, cognitive skills including attention, memory, orientation, problem solving, and sequencing, and psychosocial skills including coping strategies, environmental adaptation, and routines and behaviors.   IMPAIRMENTS: are limiting patient from IADLs, work, and social participation.   CO-MORBIDITIES: may have co-morbidities  that affects occupational performance. Patient will benefit from skilled OT to address above impairments and improve overall function.  MODIFICATION OR ASSISTANCE TO COMPLETE EVALUATION: Min-Moderate modification of tasks or assist with assess necessary to complete an evaluation.  OT OCCUPATIONAL PROFILE AND HISTORY: Problem focused assessment: Including review of records relating to presenting problem.  CLINICAL DECISION MAKING: Moderate - several treatment options, min-mod task modification necessary  REHAB POTENTIAL: Good  EVALUATION COMPLEXITY: Moderate    PLAN:  OT FREQUENCY: 1-2x/week  OT DURATION: 8 weeks  PLANNED INTERVENTIONS: self care/ADL training, therapeutic activity, neuromuscular  re-education, functional mobility training, patient/family education, cognitive remediation/compensation, visual/perceptual remediation/compensation, psychosocial skills training, energy conservation, and coping strategies training  RECOMMENDED OTHER SERVICES: SLP   CONSULTED AND AGREED WITH PLAN OF CARE: Patient and family member/caregiver  PLAN FOR NEXT SESSION: complete alternating attention task (peg board), Pill box assessment, educate on memory strategies (checks and balances, checking system)   Shirlean Berman, OTR/L 09/09/2022, 4:06 PM

## 2022-09-08 NOTE — Therapy (Addendum)
OUTPATIENT SPEECH LANGUAGE PATHOLOGY EVALUATION   Patient Name: Luke Jimenez MRN: [id number] DOB:07/13/1965, 57 y.o., male Today's Date: 09/08/2022  PCP: Garth Bigness S.MD, Lina Sayre, MD (Documentation) REFERRING PROVIDER: Meredeth Ide, MD  END OF SESSION:  End of Session - 09/08/22 2334     Visit Number 1    Number of Visits 17    Date for SLP Re-Evaluation 11/17/22    SLP Start Time 1532    SLP Stop Time  1615    SLP Time Calculation (min) 43 min    Activity Tolerance Patient tolerated treatment well             Past Medical History:  Diagnosis Date   Bradycardia    Second degree AV block    Stroke    Past Surgical History:  Procedure Laterality Date   BUBBLE STUDY  08/13/2022   Procedure: BUBBLE STUDY;  Surgeon: Christell Constant, MD;  Location: [facility] ENDOS[facility];  Service: Cardiovascular;;   PACEMAKER IMPLANT N/A 12/20/2019   Procedure: PACEMAKER IMPLANT;  Surgeon: Hillis Range, MD;  Location: [facility] INVASIVE CV LAB;  Service: Cardiovascular;  Laterality: N/A;   TEE WITHOUT CARDIOVERSION N/A 08/13/2022   Procedure: TRANSESOPHAGEAL ECHOCARDIOGRAM (TEE);  Surgeon: Christell Constant, MD;  Location: [facility] ENDOSCOPY;  Service: Cardiovascular;  Laterality: N/A;   Patient Active Problem List   Diagnosis Date Noted   Heart failure, type unknown 09/06/2022   Cardiomyopathy 08/13/2022   Cerebrovascular accident (CVA) due to embolism of precerebral artery 08/13/2022   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 male who presented to ED with complaints of nonspecific complaints of feeling off Pt family concerned that he was having difficulty with speaking over the phone. Also was having difficulty finding words with forgetfulness. Pt had slight headache yesterday but no other symptoms. CT head (08/09/22) showed hypodensity in the left temporal lobe suspicious for acute infarct. MRI brain (08/11/22) revealed "Acute left PCA territory infarcts". EEG (08/12/22) "suggestive of cortical dysfunction arising from left temporal region likely secondary to underlying stroke, post-ictal state. No further seizures were noted". PMH: second-degree AV block with pacemaker  PAIN:  Are you having pain? No  FALLS: Has patient fallen in last 6 months?  No  LIVING ENVIRONMENT: Lives with: lives with their spouse Lives in: House/apartment  PLOF:  Level of assistance: Independent with ADLs, Independent with IADLs Employment: Teacher, adult education  PATIENT GOALS: Improve reading to be able to return to work  OBJECTIVE:   DIAGNOSTIC FINDINGS:  SLE 08/12/22: Assessment / Plan / Recommendation Clinical Impression     Pt presents with primarily cognitive communication deficits, with very few word finding difficulites and likely visual recognition deficits. He is oriented fully to self and place with difficulty recalling year (1994) and reason for hospitalization. Immediate recall of listed items and perfomance with simple mathematical calculation were [facility]. Cognitive deificts noted were in the areas of delayed recall (2/5  items recalled), complex verbal problem solving and executive function (inferencing, self-monitoring/correcting). During informal conversation, no word finding or expressive language difficulties noted. Perseveration/hesistation exhibited during  confrontational naming task in x1 instance. Receptive language appeared [facility] for tasks provided. During reading (words/sentences) and picture scene task, pt with difficulty recognizing/discriminating few pictured objects/words. Some hesitations evident during reading task and pt reports no issues with visual acuity, but with some delay in recognizing word. Would need to r/o any other visual deficits to determine if solely a recognition/discrimination issue. Recommend SLP services f/u acutely to treat primarily for cognitive communication function. Ongoing assessment/ dx treatment for language function also warranted. Wife present in room/via phone for majority of session is in agreement with recommendations.       COGNITION: Overall cognitive status: Impaired Areas of impairment:  Memory: Impaired: Short term  AUDITORY COMPREHENSION: Overall auditory comprehension: Appears intact for evaluation tasks and in conversation during evaluation today about various simple and mod complex topics YES/NO questions: Appears intact Following directions: Appears intact Conversation: Moderately Complex appears WFL I READING COMPREHENSION: Impaired: sentence and paragraph; today pt stated he has trouble with words >7 letters but looked at a picture and selected the word from f:5 with 100% accuracy, "(subject) is in/on the (object)" 100% accuracy, and matched a pitcure from f:5 more complex sentences 5/5 with consistent extra time (average 16,7 seconds). Pt agreed with SLP that this task should have taken him 1/3 of the time to complete and would have prior to CVA.  EXPRESSION: verbal  VERBAL EXPRESSION: Level of generative/spontaneous verbalization: conversation Automatic speech: name: intact, counting: intact, day of week: intact, and month of year: intact  Repetition: Appears intact Naming:  Appears intact Pragmatics: Appears intact Comments: Verbal expression appears WFL  WRITTEN EXPRESSION: Dominant  hand: right Written expression:  pt reports his written language is impaired and that "My spelling is worse than usual." SLP suspected reading deficit.   MOTOR SPEECH: Overall motor speech: Appears intact  ORAL MOTOR EXAMINATION: Overall status: WFL  PATIENT REPORTED OUTCOME MEASURES (PROM): Cognitive Function: to be provided during first 2 sessions.   TODAY'S TREATMENT:                                                                                                                                         DATE:  09/08/22: Suggested home tasks for pt and wife  PATIENT EDUCATION: Education details: Results of eval, therapy course, home tasks Person educated: Patient and Spouse Education method: Explanation, Demonstration, and Verbal cues Education comprehension: verbalized understanding, returned demonstration, verbal cues required, and needs further education   GOALS: Goals reviewed with patient? No  SHORT TERM GOALS: Target date: 10/14/22  Pt will read 8-10 word sentence selections and indicate correct answers to written or spoken questions 100% success with extra time in 3 sessions Baseline: Goal status: INITIAL  2.  Pt will demo understanding of mod  complex emails of 10+ sentences with 100% success with compensations, in three sessions Baseline:  Goal status: INITIAL  3.  Pt will write semi-technical 10+ sentence (work-like) messages and/or text messages with 100% success in 3 sessions Baseline:  Goal status: INITIAL  4.  Pt will undergo cognitive linguistic testing if clinically necessary Baseline:  Goal status: INITIAL   LONG TERM GOALS: Target date: 11/16/22  Pt will have higher/better score on PROM compared to initial reading Baseline:  Goal status: INITIAL  2.  Pt will read 15+ sentence mod complex selections with compensations in 3 sessions Baseline:  Goal status: INITIAL  3.  Pt will write 10+ sentence selections (work like emails, texts, etc)  with  compensations in 3 sessions Baseline:  Goal status: INITIAL   ASSESSMENT:  CLINICAL IMPRESSION: Patient is a 57 y.o. male who was seen today for assessment of receptive and expressive language in light of CVAs in March. Pt's OT stated pt demonstrated cognitive deficits such as processing/mental calculation nad memory. SLP may assess pt's cognitive linguistics if clinically necessary. 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    OBJECTIVE IMPAIRMENTS: include memory, expressive language, and aphasia. These impairments are limiting patient from return to work, ADLs/IADLs, and effectively communicating at home and in community. Factors affecting potential to achieve goals and functional outcome are ability to learn/carryover information. Patient will benefit from skilled SLP services to address above impairments and improve overall function.  REHAB POTENTIAL: Good  PLAN:  SLP FREQUENCY: 2x/week  SLP DURATION: 8 weeks  PLANNED INTERVENTIONS: Language facilitation, Environmental controls, Cueing hierachy, Cognitive reorganization, Internal/external aids, Functional tasks, Multimodal communication approach, SLP instruction and feedback, Compensatory strategies, and Patient/family education    [facility], CCC-SLP 09/08/2022, 11:35 PM

## 2022-09-09 NOTE — Therapy (Incomplete)
OUTPATIENT SPEECH LANGUAGE PATHOLOGY APHASIA EVALUATION   Patient Name: Luke Jimenez MRN: [id number] DOB:1966-01-18, 57 y.o., male Today's Date: 09/08/2022  PCP: Shon Hale MD (Documentation) REFERRING PROVIDER: Meredeth Ide, MD  END OF SESSION:  End of Session - 09/08/22 2334     Visit Number 1    Number of Visits 17    Date for SLP Re-Evaluation 11/17/22    SLP Start Time 1532    SLP Stop Time  1615    SLP Time Calculation (min) 43 min    Activity Tolerance Patient tolerated treatment well             Past Medical History:  Diagnosis Date  . Bradycardia   . Second degree AV block   . Stroke    Past Surgical History:  Procedure Laterality Date  . BUBBLE STUDY  08/13/2022   Procedure: BUBBLE STUDY;  Surgeon: Christell Constant, MD;  Location: [facility] ENDOSCOPY;  Service: Cardiovascular;;  . PACEMAKER IMPLANT N/A 12/20/2019   Procedure: PACEMAKER IMPLANT;  Surgeon: Hillis Range, MD;  Location: [facility] INVASIVE CV LAB;  Service: Cardiovascular;  Laterality: N/A;  . TEE WITHOUT CARDIOVERSION N/A 08/13/2022   Procedure: TRANSESOPHAGEAL ECHOCARDIOGRAM (TEE);  Surgeon: Christell Constant, MD;  Location: [facility] ENDOSCOPY;  Service: Cardiovascular;  Laterality: N/A;   Patient Active Problem List   Diagnosis Date Noted  . Heart failure, type unknown 09/06/2022  . Cardiomyopathy 08/13/2022  . Cerebrovascular accident (CVA) due to embolism of precerebral artery 08/13/2022  . 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 Cryptogenic stroke 08/11/2022  . Seizure 08/10/2022  . AMS (altered mental status) 08/09/2022  . ATRIOVENTRICULAR BLOCK, 2ND DEGREE 06/17/2010    ONSET DATE: 08/09/22   REFERRING DIAG:  I63.10 (ICD-10-CM) - Cerebrovascular accident (CVA) due to embolism of precerebral artery    THERAPY DIAG:  Aphasia  Dyslexia and alexia  Cognitive communication deficit  Rationale for Evaluation and Treatment: Rehabilitation  SUBJECTIVE:    SUBJECTIVE STATEMENT: "My memory and my reading are still bad." Pt accompanied by: significant other wife Luke Jimenez  PERTINENT HISTORY: Pt is a 57 y.o. male who presented to ED with complaints of nonspecific complaints of feeling off Pt family concerned that he was having difficulty with speaking over the phone. Also was having difficulty finding words with forgetfulness. Pt had slight headache yesterday but no other symptoms. CT head (08/09/22) showed hypodensity in the left temporal lobe suspicious for acute infarct. MRI brain (08/11/22) revealed "Acute left PCA territory infarcts". EEG (08/12/22) "suggestive of cortical dysfunction arising from left temporal region likely secondary to underlying stroke, post-ictal state. No further seizures were noted". PMH: second-degree AV block with pacemaker  PAIN:  Are you having pain? No  FALLS: Has patient fallen in last 6 months?  No  LIVING ENVIRONMENT: Lives with: lives with their spouse Lives in: House/apartment  PLOF:  Level of assistance: Independent with ADLs, Independent with IADLs Employment: Teacher, adult education  PATIENT GOALS: Improve reading to be able to return to work  OBJECTIVE:   DIAGNOSTIC FINDINGS:  SLE 08/12/22: Assessment / Plan / Recommendation Clinical Impression     Pt presents with primarily cognitive communication deficits, with very few word finding difficulites and likely visual recognition deficits. He is oriented fully to self and place with difficulty recalling year (1994) and reason for hospitalization. Immediate recall of listed items and perfomance with simple mathematical calculation were [facility]. Cognitive deificts noted were in the areas of delayed recall (2/5 items  recalled), complex verbal problem solving and executive function (inferencing, self-monitoring/correcting). During informal conversation, no word finding or expressive language difficulties noted. Perseveration/hesistation exhibited during  confrontational naming task in x1 instance. Receptive language appeared [facility] for tasks provided. During reading (words/sentences) and picture scene task, pt with difficulty recognizing/discriminating few pictured objects/words. Some hesitations evident during reading task and pt reports no issues with visual acuity, but with some delay in recognizing word. Would need to r/o any other visual deficits to determine if solely a recognition/discrimination issue. Recommend SLP services f/u acutely to treat primarily for cognitive communication function. Ongoing assessment/ dx treatment for language function also warranted. Wife present in room/via phone for majority of session is in agreement with recommendations.       COGNITION: Overall cognitive status: Impaired Areas of impairment:  Memory: Impaired: Short term  AUDITORY COMPREHENSION: Overall auditory comprehension: Appears intact for evaluation tasks and in conversation during evaluation today about various simple and mod complex topics YES/NO questions: Appears intact Following directions: Appears intact Conversation: Moderately Complex appears WFL I READING COMPREHENSION: Impaired: sentence and paragraph; today pt stated he has trouble with words >7 letters but looked at a picture and selected the word from f:5 with 100% accuracy, "(subject) is in/on the (object)" 100% accuracy, and matched a pitcure from f:5 more complex sentences 5/5 with consistent extra time (average 16,7 seconds). Pt agreed with SLP that this task should have taken him 1/3 of the time to complete and would have prior to CVA.  EXPRESSION: verbal  VERBAL EXPRESSION: Level of generative/spontaneous verbalization: conversation Automatic speech: name: intact, counting: intact, day of week: intact, and month of year: intact  Repetition: Appears intact Naming:  Appears intact Pragmatics: Appears intact Comments: Verbal expression appears WFL  WRITTEN EXPRESSION: Dominant  hand: right Written expression:  pt reports his written language is impaired and that "My spelling is worse than usual." SLP suspected reading deficit.   MOTOR SPEECH: Overall motor speech: Appears intact  ORAL MOTOR EXAMINATION: Overall status: WFL  PATIENT REPORTED OUTCOME MEASURES (PROM): Cognitive Function: to be provided during first 2 sessions.   TODAY'S TREATMENT:                                                                                                                                         DATE:  09/08/22: Suggested home tasks for pt and wife  PATIENT EDUCATION: Education details: Results of eval, therapy course, home tasks Person educated: Patient and Spouse Education method: Explanation, Demonstration, and Verbal cues Education comprehension: verbalized understanding, returned demonstration, verbal cues required, and needs further education   GOALS: Goals reviewed with patient? No  SHORT TERM GOALS: Target date: 10/14/22  Pt will read 8-10 word sentence selections and indicate correct answers to written or spoken questions 100% success with extra time in 3 sessions Baseline: Goal status: INITIAL  2.  Pt will demo understanding of mod complex  emails of 10+ sentences with 100% success with compensations, in three sessions Baseline:  Goal status: INITIAL  3.  *** Baseline:  Goal status: {GOALSTATUS:25110}  4.  *** Baseline:  Goal status: {GOALSTATUS:25110}  5.  *** Baseline:  Goal status: {GOALSTATUS:25110}  6.  *** Baseline:  Goal status: {GOALSTATUS:25110}  LONG TERM GOALS: Target date: 11/16/22  *** Baseline:  Goal status: {GOALSTATUS:25110}  2.  *** Baseline:  Goal status: {GOALSTATUS:25110}  3.  *** Baseline:  Goal status: {GOALSTATUS:25110}  4.  *** Baseline:  Goal status: {GOALSTATUS:25110}  5.  *** Baseline:  Goal status: {GOALSTATUS:25110}  6.  *** Baseline:  Goal status: {GOALSTATUS:25110}  ASSESSMENT:  CLINICAL  IMPRESSION: Patient is a *** y.o. *** who was seen today for ***.   OBJECTIVE IMPAIRMENTS: include {SLPOBJIMP:27107}. These impairments are limiting patient from {SLPLIMIT:27108}. Factors affecting potential to achieve goals and functional outcome are {SLP factors:25450}. Patient will benefit from skilled SLP services to address above impairments and improve overall function.  REHAB POTENTIAL: {rehabpotential:25112}  PLAN:  SLP FREQUENCY: 2x/week  SLP DURATION: 8 weeks  PLANNED INTERVENTIONS: Language facilitation, Environmental controls, Cueing hierachy, Cognitive reorganization, Internal/external aids, Functional tasks, Multimodal communication approach, SLP instruction and feedback, Compensatory strategies, and Patient/family education    [facility], CCC-SLP 09/08/2022, 11:35 PM

## 2022-09-14 ENCOUNTER — Ambulatory Visit: Payer: No Typology Code available for payment source

## 2022-09-14 ENCOUNTER — Ambulatory Visit: Payer: No Typology Code available for payment source | Admitting: Occupational Therapy

## 2022-09-14 DIAGNOSIS — R480 Dyslexia and alexia: Secondary | ICD-10-CM

## 2022-09-14 DIAGNOSIS — I69318 Other symptoms and signs involving cognitive functions following cerebral infarction: Secondary | ICD-10-CM | POA: Diagnosis not present

## 2022-09-14 DIAGNOSIS — R4701 Aphasia: Secondary | ICD-10-CM

## 2022-09-14 DIAGNOSIS — R41840 Attention and concentration deficit: Secondary | ICD-10-CM

## 2022-09-14 DIAGNOSIS — R41841 Cognitive communication deficit: Secondary | ICD-10-CM

## 2022-09-14 NOTE — Therapy (Signed)
OUTPATIENT OCCUPATIONAL THERAPY NEURO Treatment Note  Patient Name: Luke Jimenez MRN: [id number] DOB:Apr 30, 1966, 57 y.o., male Today's Date: 09/14/2022  PCP: Shon Hale, MD REFERRING PROVIDER: Meredeth Ide, MD  END OF SESSION:  OT End of Session - 09/14/22 [phone]     Visit Number 2    Number of Visits 13    Date for OT Re-Evaluation 11/05/22    Authorization Type [employer] Preferred    OT Start Time 0936    OT Stop Time 1018    OT Time Calculation (min) 42 min              Past Medical History:  Diagnosis Date   Bradycardia    Second degree AV block    Stroke [facility])    Past Surgical History:  Procedure Laterality Date   BUBBLE STUDY  08/13/2022   Procedure: BUBBLE STUDY;  Surgeon: Christell Constant, MD;  Location: [facility] ENDOSCOPY;  Service: Cardiovascular;;   PACEMAKER IMPLANT N/A 12/20/2019   Procedure: PACEMAKER IMPLANT;  Surgeon: Hillis Range, MD;  Location: [facility] INVASIVE CV LAB;  Service: Cardiovascular;  Laterality: N/A;   TEE WITHOUT CARDIOVERSION N/A 08/13/2022   Procedure: TRANSESOPHAGEAL ECHOCARDIOGRAM (TEE);  Surgeon: Christell Constant, MD;  Location: [facility] ENDOSCOPY;  Service: Cardiovascular;  Laterality: N/A;   Patient Active Problem List   Diagnosis Date Noted   Heart failure, type unknown (HCC) 09/06/2022   Cardiomyopathy (HCC) 08/13/2022   Cerebrovascular accident (CVA) due to embolism of precerebral artery (HCC) 08/13/2022   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) 08/11/2022   Cryptogenic stroke (HCC) 08/11/2022   Seizure (HCC) 08/10/2022   AMS (altered mental status) 08/09/2022   ATRIOVENTRICULAR BLOCK, 2ND DEGREE 06/17/2010    ONSET DATE: 08/09/22  REFERRING DIAG: I63.10 (ICD-10-CM) - Cerebrovascular accident (CVA) due to embolism of precerebral artery  THERAPY DIAG:  Other symptoms and signs involving cognitive functions following cerebral infarction  Attention and concentration deficitLama, Sarina Ill,  MD  Rationale for Evaluation and Treatment: Rehabilitation  SUBJECTIVE:   SUBJECTIVE STATEMENT: Pt reports feeling like reading is getting a bit better but slowly.  Pt reports short term memory is hard to gauge, but in good spirits. Pt accompanied by: self and significant other (spouse [name])  PERTINENT HISTORY: Pt is a 57 y.o. male who presented 08/09/22 with speech difficulty and AMS. 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 EEG also showing seizures. PMH: second-degree AV block with pacemaker, bradycardia  PRECAUTIONS: None  WEIGHT BEARING RESTRICTIONS: No  PAIN:  Are you having pain? No  FALLS: Has patient fallen in last 6 months? No  LIVING ENVIRONMENT: Lives with: lives with their spouse and family nearby Lives in: House/apartment Stairs: Yes: Internal: bedroom/bathroom up full flight of steps; and External: 4-5 steps Has following equipment at home: Grab bars and built in shower seat, hand held shower head  PLOF: Independent, Independent with basic ADLs, and Vocation/Vocational requirements: Quarry manager   PATIENT GOALS: to improve in reading and short term memory  OBJECTIVE:   HAND DOMINANCE: Right  IADLs: Shopping: has gone to store with spouse, with ability to walk away to retreive an item and return to her  Meal Prep: microwave to warm up leftovers, but did not really cook before [employer] mobility: not cleared to drive Medication management: spouse is Control and instrumentation engineer: spouse takes care of and did before  COGNITION: Overall cognitive status:  Impaired with significant impairments to time orientation, sequencing, attention, and  short term memory/recall  VISION: Subjective report: no changes  VISION ASSESSMENT: To be further assessed in functional context While in hospital, OT noted questionable R inattention, to be further assessed in subsequent visits  Patient has difficulty with  following activities due to following visual impairments: reading - unsure if attention, memory, or visual  TODAY'S TREATMENT:                                    09/14/22 Engaged in small peg board pattern replication with use of key to identify correlating colors, visual perception, and attention to task.  Pt initially following line in linear path, however noticed that he would forget which peg color he was searching for before obtaining peg and would then lose his place quickly.  Pt reports he would almost have to start over to locate and correct error.  Discussed use of bookmark/line follow to increase place keeping and assist with completion of task.  Downgraded to completing peg task with pattern only (no key) with use of bookmark for line follow.  Pt with improved ability to sequence and correct errors. Educated on functional carryover of task implications with systematic process to minimize onset of errors. Medication management: OT providing recommendations to make a grid with numbers and descriptions of medications and/or use of picture of filled pill box to allow for improved "checks and balances" as progressing to taking on a more active roll in administering medications.    09/08/22 SLUMS: Pt scored 18/30 with deficits mostly in short term memory to name animals, recall words after <5 mins, and decreased orientation and sequencing with clock drawing.  Pt also requiring increased time for orientation to day and year but able to complete with increased time.  PATIENT EDUCATION: Education details: ongoing condition specific education and use of pictures or grid to aid in sequencing Person educated: Patient and Spouse Education method: Explanation Education comprehension: verbalized understanding and needs further education  HOME EXERCISE PROGRAM: TBD   GOALS: Goals reviewed with patient? Yes  SHORT TERM GOALS: Target date: 10/08/22  Pt will verbalize understanding of task  modifications and/or potential A/E needs to increase ease, safety, and independence w/ ADLs and IADLs. Baseline:  Goal status: IN PROGRESS  2.  Pt will be able to complete simulated visual scanning activity w/ at least 95% accuracy (finding targets around therapy gym, searching for items on shelves), incorporating visual and memory compensatory strategies prn Baseline:  Goal status: IN PROGRESS  3.  Pt will demonstrate improved memory, sequencing, and problem solving as demonstrated by improvements in score on pill box assessment by ability to complete in 5 mins time limit with </= to 3 errors. Baseline:  Goal status: IN PROGRESS  4.   Pt will complete table top scanning activity with Supervision, min cues for recall of instructions, sequencing during task. Baseline:  Goal status: IN PROGRESS  5.   Pt will be able to demonstrate ability to alternate attention for 5 mins during table top task. Baseline:  Goal status: IN PROGRESS  LONG TERM GOALS: Target date: 11/05/22  Pt will demonstrate ability to sequence simple functional task (simple snack prep, laundry task, etc) at Mod I level with good safety awareness and ability to utilize memory strategies for recall. Baseline:  Goal status: IN PROGRESS  2.  Pt will navigate a moderately busy environment, completing dual task activity and/or following multi-step commands with 90% accuracy  Baseline:  Goal status: IN PROGRESS  3.  Pt will complete simulated medication management activity w/ Mod Ind by discharge, incorporating compensatory strategies/AE prn Baseline:  Goal status: IN PROGRESS  4.  Pt will be independent with utilization of memory strategies to allow for increased independence with managing personal appointments. Baseline:  Goal status: IN PROGRESS  5.  Pt will be able to demonstrate ability to divide attention for 5 mins during functional task. Baseline:  Goal status: IN PROGRESS   ASSESSMENT:  CLINICAL  IMPRESSION: Engaged in discussion about when/how to decrease distractions to increase ability to attend to task, reviewed use of quiet room and/or use of noise canceling headphones to aid if focus as needed.  Discussed use of bookmark/line follow for increased sequencing and to aid in recall with placement during reading and sequencing tasks.  Reviewed adaptive strategies to aid in progress towards managing medications with use of "checks and balances" to decrease errors.  Pt with difficulty with sequencing and recognition of errors with initial peg board task, benefiting from use of bookmark for line follow and aiding in attention/recall of placement with decreased challenging pattern.  PERFORMANCE DEFICITS: in functional skills including IADLs, endurance, cardiopulmonary status limiting function, decreased knowledge of precautions, and decreased knowledge of use of DME, cognitive skills including attention, memory, orientation, problem solving, and sequencing, and psychosocial skills including coping strategies, environmental adaptation, and routines and behaviors.   IMPAIRMENTS: are limiting patient from IADLs, work, and social participation.   CO-MORBIDITIES: may have co-morbidities  that affects occupational performance. Patient will benefit from skilled OT to address above impairments and improve overall function.  MODIFICATION OR ASSISTANCE TO COMPLETE EVALUATION: Min-Moderate modification of tasks or assist with assess necessary to complete an evaluation.  OT OCCUPATIONAL PROFILE AND HISTORY: Problem focused assessment: Including review of records relating to presenting problem.  CLINICAL DECISION MAKING: Moderate - several treatment options, min-mod task modification necessary  REHAB POTENTIAL: Good  EVALUATION COMPLEXITY: Moderate    PLAN:  OT FREQUENCY: 1-2x/week  OT DURATION: 8 weeks  PLANNED INTERVENTIONS: self care/ADL training, therapeutic activity, neuromuscular  re-education, functional mobility training, patient/family education, cognitive remediation/compensation, visual/perceptual remediation/compensation, psychosocial skills training, energy conservation, and coping strategies training  RECOMMENDED OTHER SERVICES: SLP   CONSULTED AND AGREED WITH PLAN OF CARE: Patient and family member/caregiver  PLAN FOR NEXT SESSION: complete Pill box assessment, educate on memory strategies (checks and balances, checking system)   Nyrah Demos, OTR/L 09/14/2022, 10:21 AM

## 2022-09-14 NOTE — Therapy (Signed)
OUTPATIENT SPEECH LANGUAGE PATHOLOGY APHASIA TREATMENT   Patient Name: Luke Jimenez MRN: [id number] DOB:19-Feb-1966, 57 y.o., male Today's Date: 09/14/2022  PCP: Shon Hale MD (Documentation) REFERRING PROVIDER: Meredeth Ide, MD  END OF SESSION:  End of Session - 09/14/22 1305     Visit Number 2    Number of Visits 17    Date for SLP Re-Evaluation 11/17/22    SLP Start Time 0850    SLP Stop Time  0930    SLP Time Calculation (min) 40 min    Activity Tolerance Patient tolerated treatment well             Past Medical History:  Diagnosis Date   Bradycardia    Second degree AV block    Stroke [facility])    Past Surgical History:  Procedure Laterality Date   BUBBLE STUDY  08/13/2022   Procedure: BUBBLE STUDY;  Surgeon: Christell Constant, MD;  Location: [facility] ENDOSCOPY;  Service: Cardiovascular;;   PACEMAKER IMPLANT N/A 12/20/2019   Procedure: PACEMAKER IMPLANT;  Surgeon: Hillis Range, MD;  Location: [facility] INVASIVE CV LAB;  Service: Cardiovascular;  Laterality: N/A;   TEE WITHOUT CARDIOVERSION N/A 08/13/2022   Procedure: TRANSESOPHAGEAL ECHOCARDIOGRAM (TEE);  Surgeon: Christell Constant, MD;  Location: [facility] ENDOSCOPY;  Service: Cardiovascular;  Laterality: N/A;   Patient Active Problem List   Diagnosis Date Noted   Heart failure, type unknown (HCC) 09/06/2022   Cardiomyopathy (HCC) 08/13/2022   Cerebrovascular accident (CVA) due to embolism of precerebral artery (HCC) 08/13/2022   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) 08/11/2022   Cryptogenic stroke (HCC) 08/11/2022   Seizure (HCC) 08/10/2022   AMS (altered mental status) 08/09/2022   ATRIOVENTRICULAR BLOCK, 2ND DEGREE 06/17/2010    ONSET DATE: 08/09/22   REFERRING DIAG:  I63.10 (ICD-10-CM) - Cerebrovascular accident (CVA) due to embolism of precerebral artery    THERAPY DIAG:  Aphasia  Dyslexia and alexia  Cognitive communication deficit  Rationale for Evaluation and Treatment:  Rehabilitation  SUBJECTIVE:   SUBJECTIVE STATEMENT: "It's better but it is still slow." (Pt, re: reading) Pt accompanied by: significant other wife [name]  PERTINENT HISTORY: Pt is a 57 y.o. male who presented to ED with complaints of nonspecific complaints of feeling off Pt family concerned that he was having difficulty with speaking over the phone. Also was having difficulty finding words with forgetfulness. Pt had slight headache yesterday but no other symptoms. CT head (08/09/22) showed hypodensity in the left temporal lobe suspicious for acute infarct. MRI brain (08/11/22) revealed "Acute left PCA territory infarcts". EEG (08/12/22) "suggestive of cortical dysfunction arising from left temporal region likely secondary to underlying stroke, post-ictal state. No further seizures were noted". PMH: second-degree AV block with pacemaker  PAIN:  Are you having pain? No  FALLS: Has patient fallen in last 6 months?  No  PATIENT GOALS: Improve reading to be able to return to work  OBJECTIVE:   DIAGNOSTIC FINDINGS:  SLE 08/12/22: Assessment / Plan / Recommendation Clinical Impression     Pt presents with primarily cognitive communication deficits, with very few word finding difficulites and likely visual recognition deficits. He is oriented fully to self and place with difficulty recalling year (1994) and reason for hospitalization. Immediate recall of listed items and perfomance with simple mathematical calculation were [facility]. Cognitive deificts noted were in the areas of delayed recall (2/5 items recalled), complex verbal problem solving and executive function (inferencing, self-monitoring/correcting). During informal conversation, no word finding or expressive language difficulties  noted. Perseveration/hesistation exhibited during confrontational naming task in x1 instance. Receptive language appeared [facility] for tasks provided. During reading (words/sentences) and picture scene task, pt with difficulty  recognizing/discriminating few pictured objects/words. Some hesitations evident during reading task and pt reports no issues with visual acuity, but with some delay in recognizing word. Would need to r/o any other visual deficits to determine if solely a recognition/discrimination issue. Recommend SLP services f/u acutely to treat primarily for cognitive communication function. Ongoing assessment/ dx treatment for language function also warranted. Wife present in room/via phone for majority of session is in agreement with recommendations.       PATIENT REPORTED OUTCOME MEASURES (PROM): Cognitive Function: to be provided during first 2 sessions.   TODAY'S TREATMENT:                                                                                                                                         DATE:  09/14/22: SLP initiated Reading Comprehenstion Battery of Aphasia - 2nd Ed. (RCBA-2). 8/10 subtests completed.   Pt with incr'd time necessary for all subtests. Single words - choice of f:3 words to match to pic took approx 13 seconds per picture. SLP to cont assessment next session. SLP told pt to continue to read at home - pt states he is getting good practice reading text messages.   09/08/22: Suggested home tasks for pt and wife  PATIENT EDUCATION: Education details: Results of eval, therapy course, home tasks Person educated: Patient and Spouse Education method: Explanation, Demonstration, and Verbal cues Education comprehension: verbalized understanding, returned demonstration, verbal cues required, and needs further education   GOALS: Goals reviewed with patient? No  SHORT TERM GOALS: Target date: 10/14/22  Pt will read 8-10 word sentence selections and indicate correct answers to written or spoken questions 100% success with extra time in 3 sessions Baseline: Goal status: INITIAL  2.  Pt will demo understanding of mod complex emails of 10+ sentences with 100% success with  compensations, in three sessions Baseline:  Goal status: INITIAL  3.  Pt will write semi-technical 10+ sentence (work-like) messages and/or text messages with 100% success in 3 sessions Baseline:  Goal status: INITIAL  4.  Pt will undergo cognitive linguistic testing if clinically necessary Baseline:  Goal status: INITIAL   LONG TERM GOALS: Target date: 11/16/22  Pt will have higher/better score on PROM compared to initial reading Baseline:  Goal status: INITIAL  2.  Pt will read 15+ sentence mod complex selections with compensations in 3 sessions Baseline:  Goal status: INITIAL  3.  Pt will write 10+ sentence selections (work like emails, texts, etc)  with compensations in 3 sessions Baseline:  Goal status: INITIAL   ASSESSMENT:  CLINICAL IMPRESSION: Patient is a 57 y.o. male who was seen today for assessment of receptive and expressive language in light of CVAs in March. Pt's OT stated pt demonstrated cognitive  deficits such as processing/mental calculation nad memory. SLP may assess pt's cognitive linguistics if clinically necessary. 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    OBJECTIVE IMPAIRMENTS: include memory, expressive language, and aphasia. These impairments are limiting patient from return to work, ADLs/IADLs, and effectively communicating at home and in community. Factors affecting potential to achieve goals and functional outcome are ability to learn/carryover information. Patient will benefit from skilled SLP services to address above impairments and improve overall function.  REHAB POTENTIAL: Good  PLAN:  SLP FREQUENCY: 2x/week  SLP DURATION: 8 weeks  PLANNED INTERVENTIONS: Language facilitation, Environmental controls, Cueing hierachy, Cognitive reorganization, Internal/external aids, Functional tasks, Multimodal communication approach, SLP instruction and feedback, Compensatory strategies,  and Patient/family education    [facility], CCC-SLP 09/14/2022, 1:05 PM

## 2022-09-16 ENCOUNTER — Telehealth (HOSPITAL_COMMUNITY): Payer: Self-pay

## 2022-09-16 ENCOUNTER — Encounter (HOSPITAL_COMMUNITY): Payer: Self-pay | Admitting: Cardiology

## 2022-09-16 ENCOUNTER — Other Ambulatory Visit (HOSPITAL_COMMUNITY): Payer: Self-pay

## 2022-09-16 ENCOUNTER — Ambulatory Visit (HOSPITAL_COMMUNITY)
Admission: RE | Admit: 2022-09-16 | Discharge: 2022-09-16 | Disposition: A | Discharge status: Home / Self Care | Payer: No Typology Code available for payment source | Source: Ambulatory Visit | Attending: Cardiology | Admitting: Cardiology

## 2022-09-16 VITALS — BP 110/70 | HR 115 | Wt 137.8 lb

## 2022-09-16 DIAGNOSIS — I5020 Unspecified systolic (congestive) heart failure: Secondary | ICD-10-CM

## 2022-09-16 DIAGNOSIS — D6851 Activated protein C resistance: Secondary | ICD-10-CM | POA: Diagnosis not present

## 2022-09-16 DIAGNOSIS — Z950 Presence of cardiac pacemaker: Secondary | ICD-10-CM | POA: Diagnosis not present

## 2022-09-16 DIAGNOSIS — I459 Conduction disorder, unspecified: Secondary | ICD-10-CM | POA: Insufficient documentation

## 2022-09-16 DIAGNOSIS — Z7901 Long term (current) use of anticoagulants: Secondary | ICD-10-CM | POA: Diagnosis not present

## 2022-09-16 DIAGNOSIS — Z79899 Other long term (current) drug therapy: Secondary | ICD-10-CM | POA: Insufficient documentation

## 2022-09-16 DIAGNOSIS — I5022 Chronic systolic (congestive) heart failure: Secondary | ICD-10-CM | POA: Diagnosis not present

## 2022-09-16 DIAGNOSIS — I639 Cerebral infarction, unspecified: Secondary | ICD-10-CM | POA: Diagnosis not present

## 2022-09-16 DIAGNOSIS — I441 Atrioventricular block, second degree: Secondary | ICD-10-CM | POA: Diagnosis not present

## 2022-09-16 DIAGNOSIS — I509 Heart failure, unspecified: Secondary | ICD-10-CM | POA: Diagnosis not present

## 2022-09-16 DIAGNOSIS — Z8673 Personal history of transient ischemic attack (TIA), and cerebral infarction without residual deficits: Secondary | ICD-10-CM | POA: Insufficient documentation

## 2022-09-16 LAB — BASIC METABOLIC PANEL
Anion gap: 7 (ref 5–15)
BUN: 34 mg/dL — ABNORMAL HIGH (ref 6–20)
CO2: 23 mmol/L (ref 22–32)
Calcium: 9.3 mg/dL (ref 8.9–10.3)
Chloride: 104 mmol/L (ref 98–111)
Creatinine, Ser: 1.53 mg/dL — ABNORMAL HIGH (ref 0.61–1.24)
GFR, Estimated: 52 mL/min — ABNORMAL LOW (ref >60)
Glucose, Bld: 105 mg/dL — ABNORMAL HIGH (ref 70–99)
Potassium: 4.6 mmol/L (ref 3.5–5.1)
Sodium: 134 mmol/L — ABNORMAL LOW (ref 135–145)

## 2022-09-16 LAB — BRAIN NATRIURETIC PEPTIDE: B Natriuretic Peptide: 172.5 pg/mL — ABNORMAL HIGH (ref 0.0–100.0)

## 2022-09-16 MED ORDER — ENTRESTO 24-26 MG PO TABS: 1.0000 | ORAL_TABLET | Freq: Two times a day (BID) | ORAL | 11 refills | Status: DC

## 2022-09-16 NOTE — Patient Instructions (Addendum)
Medication Changes:  START Entresto 24/26 twice a day  Lab Work:  Labs done today, your results will be available in MyChart, we will contact you for abnormal readings.   You are scheduled for a Cardiac Catheterization on Tuesday, May 14 with Dr. Marca Ancona.  1. Please arrive at the [facility] (Main Entrance A) at [facility]: [address] [place], [place] [address] at 7:00 AM (This time is 2 hour(s) before your procedure to ensure your preparation). Free valet parking service is available. You will check in at ADMITTING. The support person will be asked to wait in the waiting room.  It is OK to have someone drop you off and come back when you are ready to be discharged.    Special note: Every effort is made to have your procedure done on time. Please understand that emergencies sometimes delay scheduled procedures.  2. Diet: Do not eat solid foods after midnight.  The patient may have clear liquids until 5am upon the day of the procedure.  3.  Medication instructions in preparation for your procedure:   Contrast Allergy: No   Stop taking Eliquis (Apixiban) on Monday, May 13.  HOLD FARXIGA AND SPIROLACTONE THE DAY OF THE PROCEDURE    5. Plan to go home the same day, you will only stay overnight if medically necessary. 6. Bring a current list of your medications and current insurance cards. 7. You MUST have a responsible person to drive you home. 8. Someone MUST be with you the first 24 hours after you arrive home or your discharge will be delayed. 9. Please wear clothes that are easy to get on and off and wear slip-on shoes.  Your physician has requested that you have a cardiac MRI. Cardiac MRI uses a computer to create images of your heart as its beating, producing both still and moving pictures of your heart and major blood vessels. For further information please visit [email or web address]. Please follow the instruction sheet given to you today for more information.    **once approved by insurance, they will call you to schedule an appointment**  Follow-Up in:   Your physician recommends that you schedule a follow-up appointment in: 3-4 weeks    Do the following things EVERYDAY: Weigh yourself in the morning before breakfast. Write it down and keep it in a log. Take your medicines as prescribed Eat low salt foods--Limit salt (sodium) to 2000 mg per day.  Stay as active as you can everyday Limit all fluids for the day to less than 2 liters    Need to Contact us:  If you have any questions or concerns before your next appointment please send [place] a message through [place] or call our office at [phone].    TO LEAVE A MESSAGE FOR THE NURSE SELECT OPTION 2, PLEASE LEAVE A MESSAGE INCLUDING: YOUR NAME DATE OF BIRTH CALL BACK NUMBER REASON FOR CALL**this is important as we prioritize the call backs  YOU WILL RECEIVE A CALL BACK THE SAME DAY AS LONG AS YOU CALL BEFORE 4:00 PM   At the [facility], you and your health needs are our priority. As part of our continuing mission to provide you with exceptional heart care, we have created designated Provider Care Teams. These Care Teams include your primary Cardiologist (physician) and Advanced Practice Providers (APPs- Physician Assistants and Nurse Practitioners) who all work together to provide you with the care you need, when you need it.   You may see any  of the following providers on your designated Care Team at your next follow up: Dr Arvilla Meres Dr Marca Ancona Dr. Marcos Eke, NP Robbie Lis, [place] [facility] [place], [place] Brynda Peon, NP Karle Plumber, PharmD   Please be sure to bring in all your medications bottles to every appointment.    Thank you for choosing [facility]

## 2022-09-16 NOTE — Telephone Encounter (Addendum)
  Pt aware, agreeable, and verbalized understanding Labs ordered.  ----- Message from Laurey Morale, MD sent at 09/16/2022  4:48 PM EDT ----- Increase hydration.  Do not start Entresto yet.  Come back for BMET Monday after pushing hydration over weekend and will start Entresto if creatinine is better.

## 2022-09-17 NOTE — Progress Notes (Signed)
PCP: Shon Hale, MD HF Cardiology: Dr. Shirlee Latch  57 y.o. with history of high grade heart block s/p PPM placement, CVA, and chronic systolic CHF was referred from [facility] clinic for evaluation of CHF.  Patient developed high grade heart block in 2021 and had a Medtronic PPM with left bundle lead placed.  He is now 99% LB-paced.  Cause of heart block is uncertain.  No history of sarcoidosis or Lyme disease.  CT chest in 3/24 showed no pulmonary findings concerning for sarcoidosis.   Echo in 4/21 showed EF 55-60%.  Patient did well until 3/24 when he had an acute PCA CVA.  Echo in 3/24 showed EF 20-25%, normal RV, mild TR.  TEE showed EF 20-25%, no LV thrombus, and negative bubble study.  He was started on Eliquis due to concern for cardioembolism.   Patient has some residual memory loss with difficulty reading from recent CVA.  He is rarely short of breath with exertion though he fatigues more easily than in the past.  No chest pain.  No lightheadedness or palpitations.  He walks 5000+ steps/day.   Medtronic device interrogation: No AF, 99% LB-paced  ECG (personally reviewed): sinus tachy 120, v-paced with QRS 166 msec  Labs (4/24): K 4.5, creatinine 1.0  PMH: 1. Factor V Leiden heterozygote 2. Seizure disorder 3. High grade heart block: Medtronic PPM with left bundle lead in 2021.  4. Hypothyroidism 5. Hyperlipidemia 6. 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  - Negative bubble study.  7. Chronic systolic CHF: Echo in 4/21 with EF 55-60%.  Echo (3/24) with EF 20-25%, normal RV, mild TR. - TEE (3/24): EF 20-25%, no LV thrombus, negative bubble study.   FH: No cardiomyopathy or sarcoidosis.  Parkinsons in father and brother.   SH: Nonsmoker, rare ETOH, married, Quarry manager for [employer].   ROS: All systems reviewed and negative except as per HPI.   Current Outpatient Medications  Medication Sig Dispense Refill   apixaban (ELIQUIS) 5 MG TABS  tablet Take 1 tablet (5 mg total) by mouth 2 (two) times daily. 60 tablet 3   Ascorbic Acid (VITAMIN C) 1000 MG tablet Take 1,000 mg by mouth 2 (two) times daily at 10 AM and 5 PM.     carvedilol (COREG) 3.125 MG tablet Take 1 tablet (3.125 mg total) by mouth 2 (two) times daily with a meal. HOLD for SBP less than 90 60 tablet 2   dapagliflozin propanediol (FARXIGA) 10 MG TABS tablet Take 1 tablet (10 mg total) by mouth daily before breakfast. 30 tablet 11   lacosamide (VIMPAT) 200 MG TABS tablet Take 1 tablet (200 mg total) by mouth 2 (two) times daily. 60 tablet 2   levETIRAcetam (KEPPRA) 750 MG tablet Take 2 tablets (1,500 mg total) by mouth 2 (two) times daily. 120 tablet 2   sacubitril-valsartan (ENTRESTO) 24-26 MG Take 1 tablet by mouth 2 (two) times daily. 60 tablet 11   spironolactone (ALDACTONE) 25 MG tablet Take 0.5 tablets (12.5 mg total) by mouth daily. 45 tablet 3   thyroid (ARMOUR) 60 MG tablet Take 60 mg by mouth daily before breakfast.     VITAMIN D, CHOLECALCIFEROL, PO Take 10,000 Units by mouth daily.     No current facility-administered medications for this encounter.   BP 110/70   Pulse (!) 115   Wt 62.5 kg (137 lb 12.8 oz)   SpO2 96%   BMI 19.77 kg/m  General: NAD Neck: No JVD, no  thyromegaly or thyroid nodule.  Lungs: Clear to auscultation bilaterally with normal respiratory effort. CV: Nondisplaced PMI.  Heart regular S1/S2, no S3/S4, no murmur.  No peripheral edema.  No carotid bruit.  Normal pedal pulses.  Abdomen: Soft, nontender, no hepatosplenomegaly, no distention.  Skin: Intact without lesions or rashes.  Neurologic: Alert and oriented x 3.  Psych: Normal affect. Extremities: No clubbing or cyanosis.  HEENT: Normal.   Assessment/Plan: 1. High grade heart block: Patient has MDT PPM with left bundle lead, he is pacer dependent (>99% LB paced).  Cause of heart block is uncertain.  Not known to have sarcoidosis or to have had Lyme disease.  CTA chest in 3/24  was not suggestive of pulmonary sarcoidosis. Paced QRS is quite wide at 166 msec, suspect significant dyssynchrony despite left bundle lead.  - He has seen Dr. Nelly Laurence, suspect that he will need left bundle lead extracted and CS lead placed for BiV pacing.  2. CVA: Acute PCA CVA in 3/24 with some residual difficulty with memory and reading. 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  No atrial fibrillation detected and bubble study negative. Of note, he is a Factor V Leiden heterozygote.  - Continue Eliquis due to concern for cardioembolic CVA from occult LV thrombus.  3. Chronic systolic CHF: Echo in 3/24 with EF 20-25%, normal RV, mild TR.  Cause uncertain.  It is possible that it has been triggered by long-term pacemaker-mediated dyssychrony (has LB lead but paced QRS is quite wide). Would investigate for cardiac sarcoidosis given heart block of uncertain etiology.  Also need to rule out CAD.  He is not volume overloaded on exam, NYHA class I-II.   - Continue Coreg 3.125 mg bid.  - Continue dapagliflozin 10 mg daily - Continue spironolactone 12.5 daily.  - Start Entresto 24/26 bid.  BMET/BNP today and BMET in 10 days.  - I will arrange for RHC/LHC to assess for CAD as cause of cardiomyopathy and to assess filling pressures and cardiac output.  - I will arrange for cardiac MRI to assess for evidence for cardiac sarcoidosis.  His device should be MRI compatible.  - I suspect that he will need extraction of left bundle lead with placement of CS lead for CRT if EF does not improve with medical therapy.  Will need to go back to Dr. Nelly Laurence for this after rest of workup has been completed.   Luke Jimenez 09/17/2022

## 2022-09-17 NOTE — H&P (View-Only) (Signed)
PCP: Timberlake, Kathryn S, MD HF Cardiology: Dr. Talyn Dessert  57 y.o. with history of high grade heart block s/p PPM placement, CVA, and chronic systolic CHF was referred from [facility] clinic for evaluation of CHF.  Patient developed high grade heart block in 2021 and had a Medtronic PPM with left bundle lead placed.  He is now 99% LB-paced.  Cause of heart block is uncertain.  No history of sarcoidosis or Lyme disease.  CT chest in 3/24 showed no pulmonary findings concerning for sarcoidosis.   Echo in 4/21 showed EF 55-60%.  Patient did well until 3/24 when he had an acute PCA CVA.  Echo in 3/24 showed EF 20-25%, normal RV, mild TR.  TEE showed EF 20-25%, no LV thrombus, and negative bubble study.  He was started on Eliquis due to concern for cardioembolism.   Patient has some residual memory loss with difficulty reading from recent CVA.  He is rarely short of breath with exertion though he fatigues more easily than in the past.  No chest pain.  No lightheadedness or palpitations.  He walks 5000+ steps/day.   Medtronic device interrogation: No AF, 99% LB-paced  ECG (personally reviewed): sinus tachy 120, v-paced with QRS 166 msec  Labs (4/24): K 4.5, creatinine 1.0  PMH: 1. Factor V Leiden heterozygote 2. Seizure disorder 3. High grade heart block: Medtronic PPM with left bundle lead in 2021.  4. Hypothyroidism 5. Hyperlipidemia 6. 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  - Negative bubble study.  7. Chronic systolic CHF: Echo in 4/21 with EF 55-60%.  Echo (3/24) with EF 20-25%, normal RV, mild TR. - TEE (3/24): EF 20-25%, no LV thrombus, negative bubble study.   FH: No cardiomyopathy or sarcoidosis.  Parkinsons in father and brother.   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   ROS: All systems reviewed and negative except as per HPI.   Current Outpatient Medications  Medication Sig Dispense Refill   apixaban (ELIQUIS) 5 MG TABS  tablet Take 1 tablet (5 mg total) by mouth 2 (two) times daily. 60 tablet 3   Ascorbic Acid (VITAMIN C) 1000 MG tablet Take 1,000 mg by mouth 2 (two) times daily at 10 AM and 5 PM.     carvedilol (COREG) 3.125 MG tablet Take 1 tablet (3.125 mg total) by mouth 2 (two) times daily with a meal. HOLD for SBP less than 90 60 tablet 2   dapagliflozin propanediol (FARXIGA) 10 MG TABS tablet Take 1 tablet (10 mg total) by mouth daily before breakfast. 30 tablet 11   lacosamide (VIMPAT) 200 MG TABS tablet Take 1 tablet (200 mg total) by mouth 2 (two) times daily. 60 tablet 2   levETIRAcetam (KEPPRA) 750 MG tablet Take 2 tablets (1,500 mg total) by mouth 2 (two) times daily. 120 tablet 2   sacubitril-valsartan (ENTRESTO) 24-26 MG Take 1 tablet by mouth 2 (two) times daily. 60 tablet 11   spironolactone (ALDACTONE) 25 MG tablet Take 0.5 tablets (12.5 mg total) by mouth daily. 45 tablet 3   thyroid (ARMOUR) 60 MG tablet Take 60 mg by mouth daily before breakfast.     VITAMIN D, CHOLECALCIFEROL, PO Take 10,000 Units by mouth daily.     No current facility-administered medications for this encounter.   BP 110/70   Pulse (!) 115   Wt 62.5 kg (137 lb 12.8 oz)   SpO2 96%   BMI 19.77 kg/m  General: NAD Neck: No JVD, no   thyromegaly or thyroid nodule.  Lungs: Clear to auscultation bilaterally with normal respiratory effort. CV: Nondisplaced PMI.  Heart regular S1/S2, no S3/S4, no murmur.  No peripheral edema.  No carotid bruit.  Normal pedal pulses.  Abdomen: Soft, nontender, no hepatosplenomegaly, no distention.  Skin: Intact without lesions or rashes.  Neurologic: Alert and oriented x 3.  Psych: Normal affect. Extremities: No clubbing or cyanosis.  HEENT: Normal.   Assessment/Plan: 1. High grade heart block: Patient has MDT PPM with left bundle lead, he is pacer dependent (>99% LB paced).  Cause of heart block is uncertain.  Not known to have sarcoidosis or to have had Lyme disease.  CTA chest in 3/24  was not suggestive of pulmonary sarcoidosis. Paced QRS is quite wide at 166 msec, suspect significant dyssynchrony despite left bundle lead.  - He has seen Dr. Mealor, suspect that he will need left bundle lead extracted and CS lead placed for BiV pacing.  2. CVA: Acute PCA CVA in 3/24 with some residual difficulty with memory and reading. 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  No atrial fibrillation detected and bubble study negative. Of note, he is a Factor V Leiden heterozygote.  - Continue Eliquis due to concern for cardioembolic CVA from occult LV thrombus.  3. Chronic systolic CHF: Echo in 3/24 with EF 20-25%, normal RV, mild TR.  Cause uncertain.  It is possible that it has been triggered by long-term pacemaker-mediated dyssychrony (has LB lead but paced QRS is quite wide). Would investigate for cardiac sarcoidosis given heart block of uncertain etiology.  Also need to rule out CAD.  He is not volume overloaded on exam, NYHA class I-II.   - Continue Coreg 3.125 mg bid.  - Continue dapagliflozin 10 mg daily - Continue spironolactone 12.5 daily.  - Start Entresto 24/26 bid.  BMET/BNP today and BMET in 10 days.  - I will arrange for RHC/LHC to assess for CAD as cause of cardiomyopathy and to assess filling pressures and cardiac output.  - I will arrange for cardiac MRI to assess for evidence for cardiac sarcoidosis.  His device should be MRI compatible.  - I suspect that he will need extraction of left bundle lead with placement of CS lead for CRT if EF does not improve with medical therapy.  Will need to go back to Dr. Mealor for this after rest of workup has been completed.   Jabori Henegar 09/17/2022    

## 2022-09-20 ENCOUNTER — Ambulatory Visit (INDEPENDENT_AMBULATORY_CARE_PROVIDER_SITE_OTHER): Payer: No Typology Code available for payment source

## 2022-09-20 DIAGNOSIS — I6310 Cerebral infarction due to embolism of unspecified precerebral artery: Secondary | ICD-10-CM | POA: Diagnosis not present

## 2022-09-20 LAB — CUP PACEART REMOTE DEVICE CHECK
Battery Remaining Longevity: 99 mo
Battery Voltage: 2.98 V
Brady Statistic AP VP Percent: 0.02 %
Brady Statistic AP VS Percent: 0 %
Brady Statistic AS VP Percent: 99.32 %
Brady Statistic AS VS Percent: 0.66 %
Brady Statistic RA Percent Paced: 0.02 %
Brady Statistic RV Percent Paced: 99.34 %
Date Time Interrogation Session: 20240505215102
Implantable Lead Connection Status: 753985
Implantable Lead Connection Status: 753985
Implantable Lead Implant Date: 20210805
Implantable Lead Implant Date: 20210805
Implantable Lead Location: 753859
Implantable Lead Location: 753860
Implantable Lead Model: 3830
Implantable Lead Model: 5076
Implantable Pulse Generator Implant Date: 20210805
Lead Channel Impedance Value: 342 Ohm
Lead Channel Impedance Value: 361 Ohm
Lead Channel Impedance Value: 475 Ohm
Lead Channel Impedance Value: 513 Ohm
Lead Channel Pacing Threshold Amplitude: 0.625 V
Lead Channel Pacing Threshold Amplitude: 1.25 V
Lead Channel Pacing Threshold Pulse Width: 0.4 ms
Lead Channel Pacing Threshold Pulse Width: 0.4 ms
Lead Channel Sensing Intrinsic Amplitude: 2.875 mV
Lead Channel Sensing Intrinsic Amplitude: 2.875 mV
Lead Channel Sensing Intrinsic Amplitude: 7.375 mV
Lead Channel Sensing Intrinsic Amplitude: 8.125 mV
Lead Channel Setting Pacing Amplitude: 1.5 V
Lead Channel Setting Pacing Amplitude: 2.5 V
Lead Channel Setting Pacing Pulse Width: 0.4 ms
Lead Channel Setting Sensing Sensitivity: 0.9 mV
Zone Setting Status: 755011

## 2022-09-23 ENCOUNTER — Ambulatory Visit: Payer: No Typology Code available for payment source | Attending: Family Medicine

## 2022-09-23 ENCOUNTER — Ambulatory Visit (HOSPITAL_COMMUNITY)
Admission: RE | Admit: 2022-09-23 | Discharge: 2022-09-23 | Disposition: A | Discharge status: Home / Self Care | Payer: No Typology Code available for payment source | Source: Ambulatory Visit | Attending: Internal Medicine | Admitting: Internal Medicine

## 2022-09-23 DIAGNOSIS — I69318 Other symptoms and signs involving cognitive functions following cerebral infarction: Secondary | ICD-10-CM | POA: Insufficient documentation

## 2022-09-23 DIAGNOSIS — R4701 Aphasia: Secondary | ICD-10-CM | POA: Diagnosis present

## 2022-09-23 DIAGNOSIS — R41841 Cognitive communication deficit: Secondary | ICD-10-CM | POA: Insufficient documentation

## 2022-09-23 DIAGNOSIS — I5020 Unspecified systolic (congestive) heart failure: Secondary | ICD-10-CM | POA: Insufficient documentation

## 2022-09-23 DIAGNOSIS — R41840 Attention and concentration deficit: Secondary | ICD-10-CM | POA: Diagnosis present

## 2022-09-23 DIAGNOSIS — R480 Dyslexia and alexia: Secondary | ICD-10-CM | POA: Diagnosis present

## 2022-09-23 LAB — BASIC METABOLIC PANEL
Anion gap: 8 (ref 5–15)
BUN: 26 mg/dL — ABNORMAL HIGH (ref 6–20)
CO2: 27 mmol/L (ref 22–32)
Calcium: 9.4 mg/dL (ref 8.9–10.3)
Chloride: 103 mmol/L (ref 98–111)
Creatinine, Ser: 1.2 mg/dL (ref 0.61–1.24)
GFR, Estimated: >60 mL/min (ref >60)
Glucose, Bld: 118 mg/dL — ABNORMAL HIGH (ref 70–99)
Potassium: 4.4 mmol/L (ref 3.5–5.1)
Sodium: 138 mmol/L (ref 135–145)

## 2022-09-23 NOTE — Therapy (Signed)
OUTPATIENT SPEECH LANGUAGE PATHOLOGY TREATMENT   Patient Name: Luke Jimenez MRN: [id number] DOB:[birth year], 57 y.o., male Today's Date: 09/23/2022  PCP: Shon Hale MD (Documentation) REFERRING PROVIDER: Meredeth Ide, MD  END OF SESSION:  End of Session - 09/23/22 0809     Visit Number 3    Number of Visits 17    Date for SLP Re-Evaluation 11/17/22    SLP Start Time 0804    SLP Stop Time  0845    SLP Time Calculation (min) 41 min    Activity Tolerance Patient tolerated treatment well             Past Medical History:  Diagnosis Date   Bradycardia    Second degree AV block    Stroke [facility])    Past Surgical History:  Procedure Laterality Date   BUBBLE STUDY  08/13/2022   Procedure: BUBBLE STUDY;  Surgeon: Christell Constant, MD;  Location: [facility] ENDOSCOPY;  Service: Cardiovascular;;   PACEMAKER IMPLANT N/A 12/20/2019   Procedure: PACEMAKER IMPLANT;  Surgeon: Hillis Range, MD;  Location: [facility] INVASIVE CV LAB;  Service: Cardiovascular;  Laterality: N/A;   TEE WITHOUT CARDIOVERSION N/A 08/13/2022   Procedure: TRANSESOPHAGEAL ECHOCARDIOGRAM (TEE);  Surgeon: Christell Constant, MD;  Location: [facility] ENDOSCOPY;  Service: Cardiovascular;  Laterality: N/A;   Patient Active Problem List   Diagnosis Date Noted   Heart failure, type unknown (HCC) 09/06/2022   Cardiomyopathy (HCC) 08/13/2022   Cerebrovascular accident (CVA) due to embolism of precerebral artery (HCC) 08/13/2022   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) 08/11/2022   Cryptogenic stroke (HCC) 08/11/2022   Seizure (HCC) 08/10/2022   AMS (altered mental status) 08/09/2022   ATRIOVENTRICULAR BLOCK, 2ND DEGREE 06/17/2010    ONSET DATE: 08/09/22   REFERRING DIAG:  I63.10 (ICD-10-CM) - Cerebrovascular accident (CVA) due to embolism of precerebral artery    THERAPY DIAG:  Aphasia  Dyslexia and alexia  Cognitive communication deficit  Rationale for Evaluation and Treatment:  Rehabilitation  SUBJECTIVE:   SUBJECTIVE STATEMENT: "It's better but it is still slow." (Pt, re: reading) Pt accompanied by: significant other wife [name]  PERTINENT HISTORY: Pt is a 57 y.o. male who presented to ED with complaints of nonspecific complaints of feeling off Pt family concerned that he was having difficulty with speaking over the phone. Also was having difficulty finding words with forgetfulness. Pt had slight headache yesterday but no other symptoms. CT head (08/09/22) showed hypodensity in the left temporal lobe suspicious for acute infarct. MRI brain (08/11/22) revealed "Acute left PCA territory infarcts". EEG (08/12/22) "suggestive of cortical dysfunction arising from left temporal region likely secondary to underlying stroke, post-ictal state. No further seizures were noted". PMH: second-degree AV block with pacemaker  PAIN:  Are you having pain? No  FALLS: Has patient fallen in last 6 months?  No  PATIENT GOALS: Improve reading to be able to return to work  OBJECTIVE:   DIAGNOSTIC FINDINGS:  SLE 08/12/22: Assessment / Plan / Recommendation Clinical Impression     Pt presents with primarily cognitive communication deficits, with very few word finding difficulites and likely visual recognition deficits. He is oriented fully to self and place with difficulty recalling year (1994) and reason for hospitalization. Immediate recall of listed items and perfomance with simple mathematical calculation were [facility]. Cognitive deificts noted were in the areas of delayed recall (2/5 items recalled), complex verbal problem solving and executive function (inferencing, self-monitoring/correcting). During informal conversation, no word finding or expressive language difficulties noted.  Perseveration/hesistation exhibited during confrontational naming task in x1 instance. Receptive language appeared [facility] for tasks provided. During reading (words/sentences) and picture scene task, pt with difficulty  recognizing/discriminating few pictured objects/words. Some hesitations evident during reading task and pt reports no issues with visual acuity, but with some delay in recognizing word. Would need to r/o any other visual deficits to determine if solely a recognition/discrimination issue. Recommend SLP services f/u acutely to treat primarily for cognitive communication function. Ongoing assessment/ dx treatment for language function also warranted. Wife present in room/via phone for majority of session is in agreement with recommendations.       PATIENT REPORTED OUTCOME MEASURES (PROM): Cognitive Function: to be provided during first 2 sessions.   TODAY'S TREATMENT:                                                                                                                                         DATE:  09/23/22: Pt and wife and SLP talked about pt's cognitive deficits - pt and wife agreed pt's overall processing is slower than before CVA. SLP could initiate (or complete) CLQT with pt next session. Daoud also endorses some deficits in higher level attention ("I just have to do one thing and then do the other thing - I can't do them together anymore."). With extra time allowed, pt showed good (not excellent) insight what he might need to tell his supervisor if he were told to return to work this week. During this discussion pt indicated he was more tired now than pre-CVA. 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 SLP continued the Reading Comprehenstion Battery of Aphasia - 2nd Ed. (RCBA-2); SLP-Shomari Matusik to complete the last subtest the week of 10-04-22.  09/14/22: SLP initiated Reading Comprehenstion Battery of Aphasia - 2nd Ed. (RCBA-2). 8/10 subtests completed.   Pt with incr'd time necessary for all subtests. Single words - choice of f:3 words to match to pic took approx 13 seconds per picture. SLP to cont assessment next  session. SLP told pt to continue to read at home - pt states he is getting good practice reading text messages.   09/08/22: Suggested home tasks for pt and wife  PATIENT EDUCATION: Education details: Results of eval, therapy course, home tasks Person educated: Patient and Spouse Education method: Explanation, Demonstration, and Verbal cues Education comprehension: verbalized understanding, returned demonstration, verbal cues required, and needs further education   GOALS: Goals reviewed with patient? No  SHORT TERM GOALS: Target date: 10/14/22  Pt will read 8-10 word sentence selections and indicate correct answers to written or spoken questions 100% success with extra time in 3 sessions Baseline: Goal status: INITIAL  2.  Pt will demo understanding of mod complex emails of 10+ sentences with 100% success with compensations, in three sessions Baseline:  Goal status: INITIAL  3.  Pt will write semi-technical 10+ sentence (work-like) messages and/or text messages with 100% success in 3 sessions Baseline:  Goal status: INITIAL  4.  Pt will undergo cognitive linguistic testing if clinically necessary Baseline:  Goal status: INITIAL   LONG TERM GOALS: Target date: 11/16/22  Pt will have higher/better score on PROM compared to initial reading Baseline:  Goal status: INITIAL  2.  Pt will read 15+ sentence mod complex selections with compensations in 3 sessions Baseline:  Goal status: INITIAL  3.  Pt will write 10+ sentence selections (work like emails, texts, etc)  with compensations in 3 sessions Baseline:  Goal status: INITIAL   ASSESSMENT:  CLINICAL IMPRESSION: Patient is a 57 y.o. male who was seen today for treatment of cognitive linguistics and with reading in light of CVAs in March. Pt's OT stated pt demonstrated cognitive deficits such as processing/mental calculation, and memory. SLP to ssess pt's cognitive linguistics next session and add goals as necessary.  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    OBJECTIVE IMPAIRMENTS: include memory, expressive language, and aphasia. These impairments are limiting patient from return to work, ADLs/IADLs, and effectively communicating at home and in community. Factors affecting potential to achieve goals and functional outcome are ability to learn/carryover information. Patient will benefit from skilled SLP services to address above impairments and improve overall function.  REHAB POTENTIAL: Good  PLAN:  SLP FREQUENCY: 2x/week  SLP DURATION: 8 weeks  PLANNED INTERVENTIONS: Language facilitation, Environmental controls, Cueing hierachy, Cognitive reorganization, Internal/external aids, Functional tasks, Multimodal communication approach, SLP instruction and feedback, Compensatory strategies, and Patient/family education    [facility], CCC-SLP 09/23/2022, 8:21 AM

## 2022-09-27 ENCOUNTER — Ambulatory Visit: Payer: No Typology Code available for payment source | Admitting: Occupational Therapy

## 2022-09-27 ENCOUNTER — Ambulatory Visit: Payer: No Typology Code available for payment source | Admitting: Speech Pathology

## 2022-09-27 ENCOUNTER — Telehealth (HOSPITAL_COMMUNITY): Payer: Self-pay

## 2022-09-27 DIAGNOSIS — R4701 Aphasia: Secondary | ICD-10-CM

## 2022-09-27 DIAGNOSIS — R41841 Cognitive communication deficit: Secondary | ICD-10-CM

## 2022-09-27 DIAGNOSIS — I69318 Other symptoms and signs involving cognitive functions following cerebral infarction: Secondary | ICD-10-CM

## 2022-09-27 DIAGNOSIS — R480 Dyslexia and alexia: Secondary | ICD-10-CM

## 2022-09-27 DIAGNOSIS — R41840 Attention and concentration deficit: Secondary | ICD-10-CM

## 2022-09-27 NOTE — Telephone Encounter (Signed)
Spoke to patient's wife. Aware of time of procedure. Is holding as directed. Aware of holding medications in am. Has transportation to and from procedure

## 2022-09-27 NOTE — Therapy (Signed)
OUTPATIENT SPEECH LANGUAGE PATHOLOGY TREATMENT   Patient Name: Luke Jimenez MRN: [id number] DOB:[birth year], 57 y.o., male Today's Date: 09/27/2022  PCP: Shon Hale MD (Documentation) REFERRING PROVIDER: Meredeth Ide, MD  END OF SESSION:  End of Session - 09/27/22 1702     Visit Number 4    Number of Visits 17    Date for SLP Re-Evaluation 11/17/22    SLP Start Time 1615    SLP Stop Time  1703    SLP Time Calculation (min) 48 min    Activity Tolerance Patient tolerated treatment well              Past Medical History:  Diagnosis Date   Bradycardia    Second degree AV block    Stroke [facility])    Past Surgical History:  Procedure Laterality Date   BUBBLE STUDY  08/13/2022   Procedure: BUBBLE STUDY;  Surgeon: Christell Constant, MD;  Location: [facility] ENDOSCOPY;  Service: Cardiovascular;;   PACEMAKER IMPLANT N/A 12/20/2019   Procedure: PACEMAKER IMPLANT;  Surgeon: Hillis Range, MD;  Location: [facility] INVASIVE CV LAB;  Service: Cardiovascular;  Laterality: N/A;   TEE WITHOUT CARDIOVERSION N/A 08/13/2022   Procedure: TRANSESOPHAGEAL ECHOCARDIOGRAM (TEE);  Surgeon: Christell Constant, MD;  Location: [facility] ENDOSCOPY;  Service: Cardiovascular;  Laterality: N/A;   Patient Active Problem List   Diagnosis Date Noted   Heart failure, type unknown (HCC) 09/06/2022   Cardiomyopathy (HCC) 08/13/2022   Cerebrovascular accident (CVA) due to embolism of precerebral artery (HCC) 08/13/2022   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) 08/11/2022   Cryptogenic stroke (HCC) 08/11/2022   Seizure (HCC) 08/10/2022   AMS (altered mental status) 08/09/2022   ATRIOVENTRICULAR BLOCK, 2ND DEGREE 06/17/2010    ONSET DATE: 08/09/22   REFERRING DIAG:  I63.10 (ICD-10-CM) - Cerebrovascular accident (CVA) due to embolism of precerebral artery    THERAPY DIAG:  Aphasia  Cognitive communication deficit  Dyslexia and alexia  Rationale for Evaluation and Treatment:  Rehabilitation  SUBJECTIVE:   SUBJECTIVE STATEMENT: Pt reports ongoing HEP completion targeting reading with small improvements noted  Pt accompanied by: significant other wife [name]  PERTINENT HISTORY: Pt is a 57 y.o. male who presented to ED with complaints of nonspecific complaints of feeling off Pt family concerned that he was having difficulty with speaking over the phone. Also was having difficulty finding words with forgetfulness. Pt had slight headache yesterday but no other symptoms. CT head (08/09/22) showed hypodensity in the left temporal lobe suspicious for acute infarct. MRI brain (08/11/22) revealed "Acute left PCA territory infarcts". EEG (08/12/22) "suggestive of cortical dysfunction arising from left temporal region likely secondary to underlying stroke, post-ictal state. No further seizures were noted". PMH: second-degree AV block with pacemaker  PAIN:  Are you having pain? No  FALLS: Has patient fallen in last 6 months?  No  PATIENT GOALS: Improve reading to be able to return to work  OBJECTIVE:   DIAGNOSTIC FINDINGS:  SLE 08/12/22: Assessment / Plan / Recommendation Clinical Impression     Pt presents with primarily cognitive communication deficits, with very few word finding difficulites and likely visual recognition deficits. He is oriented fully to self and place with difficulty recalling year (1994) and reason for hospitalization. Immediate recall of listed items and perfomance with simple mathematical calculation were [facility]. Cognitive deificts noted were in the areas of delayed recall (2/5 items recalled), complex verbal problem solving and executive function (inferencing, self-monitoring/correcting). During informal conversation, no word finding or expressive  language difficulties noted. Perseveration/hesistation exhibited during confrontational naming task in x1 instance. Receptive language appeared [facility] for tasks provided. During reading (words/sentences) and picture  scene task, pt with difficulty recognizing/discriminating few pictured objects/words. Some hesitations evident during reading task and pt reports no issues with visual acuity, but with some delay in recognizing word. Would need to r/o any other visual deficits to determine if solely a recognition/discrimination issue. Recommend SLP services f/u acutely to treat primarily for cognitive communication function. Ongoing assessment/ dx treatment for language function also warranted. Wife present in room/via phone for majority of session is in agreement with recommendations.       PATIENT REPORTED OUTCOME MEASURES (PROM): Cognitive Function: to be provided during first 2 sessions.   TODAY'S TREATMENT:                                                                                                                                         DATE:  09/27/22: Addressed reading with use of compensations to aid in attention. Pt benefiting from oral reading, aiding in recall of x7 pertinent details from paragraph level stimuli, in spite of occasional disruption in reading fluency. Provided education on use of text to speech settings on [employer] products to aid in reading practice. Recommend reading with support, then returning and re-reading IND to aid in increased fluency. Pt to trial reading aloud to optimize attention and recall of read material.   09/23/22: Pt and wife and SLP talked about pt's cognitive deficits - pt and wife agreed pt's overall processing is slower than before CVA. SLP could initiate (or complete) CLQT with pt next session. Antares also endorses some deficits in higher level attention ("I just have to do one thing and then do the other thing - I can't do them together anymore."). With extra time allowed, pt showed good (not excellent) insight what he might need to tell his supervisor if he were told to return to work this week. During this discussion pt indicated he was more tired now than pre-CVA. 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 SLP continued the Reading Comprehenstion Battery of Aphasia - 2nd Ed. (RCBA-2); SLP-Schinke to complete the last subtest the week of 10-04-22.  09/14/22: SLP initiated Reading Comprehenstion Battery of Aphasia - 2nd Ed. (RCBA-2). 8/10 subtests completed.   Pt with incr'd time necessary for all subtests. Single words - choice of f:3 words to match to pic took approx 13 seconds per picture. SLP to cont assessment next session. SLP told pt to continue to read at home - pt states he is getting good practice reading text messages.   09/08/22: Suggested home tasks for pt and wife  PATIENT EDUCATION: Education details: Results of eval, therapy course, home tasks Person educated: Patient and Spouse Education method: Explanation, Demonstration, and Verbal cues Education comprehension:  verbalized understanding, returned demonstration, verbal cues required, and needs further education   GOALS: Goals reviewed with patient? No  SHORT TERM GOALS: Target date: 10/14/22  Pt will read 8-10 word sentence selections and indicate correct answers to written or spoken questions 100% success with extra time in 3 sessions Baseline: Goal status: INITIAL  2.  Pt will demo understanding of mod complex emails of 10+ sentences with 100% success with compensations, in three sessions Baseline:  Goal status: INITIAL  3.  Pt will write semi-technical 10+ sentence (work-like) messages and/or text messages with 100% success in 3 sessions Baseline:  Goal status: INITIAL  4.  Pt will undergo cognitive linguistic testing if clinically necessary Baseline:  Goal status: INITIAL   LONG TERM GOALS: Target date: 11/16/22  Pt will have higher/better score on PROM compared to initial reading Baseline:  Goal status: INITIAL  2.  Pt will read 15+ sentence mod complex selections with compensations in 3  sessions Baseline:  Goal status: INITIAL  3.  Pt will write 10+ sentence selections (work like emails, texts, etc)  with compensations in 3 sessions Baseline:  Goal status: INITIAL   ASSESSMENT:  CLINICAL IMPRESSION: Patient is a 58 y.o. male who was seen today for treatment of cognitive linguistics and with reading in light of CVAs in March. Pt's OT stated pt demonstrated cognitive deficits such as processing/mental calculation, and memory. SLP to ssess pt's cognitive linguistics next session and add goals as necessary. 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    OBJECTIVE IMPAIRMENTS: include memory, expressive language, and aphasia. These impairments are limiting patient from return to work, ADLs/IADLs, and effectively communicating at home and in community. Factors affecting potential to achieve goals and functional outcome are ability to learn/carryover information. Patient will benefit from skilled SLP services to address above impairments and improve overall function.  REHAB POTENTIAL: Good  PLAN:  SLP FREQUENCY: 2x/week  SLP DURATION: 8 weeks  PLANNED INTERVENTIONS: Language facilitation, Environmental controls, Cueing hierachy, Cognitive reorganization, Internal/external aids, Functional tasks, Multimodal communication approach, SLP instruction and feedback, Compensatory strategies, and Patient/family education    Maia Breslow, CCC-SLP 09/27/2022, 5:03 PM

## 2022-09-27 NOTE — Therapy (Signed)
OUTPATIENT OCCUPATIONAL THERAPY NEURO Treatment Note  Patient Name: Luke Jimenez MRN: [id number] DOB:10-23-1965, 57 y.o., male Today's Date: 09/27/2022  PCP: Shon Hale, MD REFERRING PROVIDER: Meredeth Ide, MD  END OF SESSION:  OT End of Session - 09/27/22 1543     Visit Number 3    Number of Visits 13    Date for OT Re-Evaluation 11/05/22    Authorization Type [employer]    OT Start Time 1534    OT Stop Time 1615    OT Time Calculation (min) 41 min               Past Medical History:  Diagnosis Date   Bradycardia    Second degree AV block    Stroke [facility])    Past Surgical History:  Procedure Laterality Date   BUBBLE STUDY  08/13/2022   Procedure: BUBBLE STUDY;  Surgeon: Christell Constant, MD;  Location: [facility] ENDOSCOPY;  Service: Cardiovascular;;   PACEMAKER IMPLANT N/A 12/20/2019   Procedure: PACEMAKER IMPLANT;  Surgeon: Hillis Range, MD;  Location: [facility] INVASIVE CV LAB;  Service: Cardiovascular;  Laterality: N/A;   TEE WITHOUT CARDIOVERSION N/A 08/13/2022   Procedure: TRANSESOPHAGEAL ECHOCARDIOGRAM (TEE);  Surgeon: Christell Constant, MD;  Location: [facility] ENDOSCOPY;  Service: Cardiovascular;  Laterality: N/A;   Patient Active Problem List   Diagnosis Date Noted   Heart failure, type unknown (HCC) 09/06/2022   Cardiomyopathy (HCC) 08/13/2022   Cerebrovascular accident (CVA) due to embolism of precerebral artery (HCC) 08/13/2022   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  Spouse reports min to no improvements in processing speed. Pt accompanied by: self and significant other (spouse [name])  PERTINENT HISTORY: Pt is a 57 y.o. male who presented 08/09/22 with speech difficulty and AMS. 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 EEG also showing seizures. PMH: second-degree AV block with pacemaker, bradycardia  PRECAUTIONS: None  WEIGHT BEARING RESTRICTIONS: No  PAIN:  Are you having pain? No  FALLS: Has patient fallen in last 6 months? No  LIVING ENVIRONMENT: Lives with: lives with their spouse and family nearby Lives in: House/apartment Stairs: Yes: Internal: bedroom/bathroom up full flight of steps; and External: 4-5 steps Has following equipment at home: Grab bars and built in shower seat, hand held shower head  PLOF: Independent, Independent with basic ADLs, and Vocation/Vocational requirements: Quarry manager   PATIENT GOALS: to improve in reading and short term memory  OBJECTIVE:   HAND DOMINANCE: Right  IADLs: Shopping: has gone to store with spouse, with ability to walk away to retreive an item and return to her  Meal Prep: microwave to warm up leftovers, but did not really cook before [employer] mobility: not cleared to drive Medication management: spouse is Control and instrumentation engineer: spouse takes care of and did before  COGNITION: Overall cognitive status:  Impaired with significant impairments to time orientation, sequencing, attention, and short term memory/recall  VISION: Subjective report:  no changes  VISION ASSESSMENT: To be further assessed in functional context While in hospital, OT noted questionable R inattention, to be further assessed in subsequent visits  Patient has difficulty with following activities due  to following visual impairments: reading - unsure if attention, memory, or visual  TODAY'S TREATMENT:                                    09/27/22 Pill box assessment: Pt completed in 4:41 with no errors.  Engaged in discussion of "checks and balances" observed that increased pt success as well as how to further increase success.  Pt opening only slots/rows as needed per "prescription" allowing for decreased error.  Discussed organizing meds in progressive order to allow for increased ease/success.  Discussed increasing active participation in meds to decrease burden on caregiver and increase independence. Trail making test: Trail A: 32 sec with no errors and Trail B: 1:32 with one error.  OT educated on functional carryover of alternating attention of Trail B and how to carry over to additional functional tasks.  Encouraged participation in aspects of household tasks to increase processing speed, recall, and sequencing.  Discussed safe failure (within reason) to allow for education and increased independence.    09/14/22 Engaged in small peg board pattern replication with use of key to identify correlating colors, visual perception, and attention to task.  Pt initially following line in linear path, however noticed that he would forget which peg color he was searching for before obtaining peg and would then lose his place quickly.  Pt reports he would almost have to start over to locate and correct error.  Discussed use of bookmark/line follow to increase place keeping and assist with completion of task.  Downgraded to completing peg task with pattern only (no key) with use of bookmark for line follow.  Pt with improved ability to sequence and correct errors. Educated on functional carryover of task implications with systematic process to minimize onset of errors. Medication management: OT providing recommendations to make a grid with numbers and descriptions of medications and/or use of picture of filled  pill box to allow for improved "checks and balances" as progressing to taking on a more active roll in administering medications.    09/08/22 SLUMS: Pt scored 18/30 with deficits mostly in short term memory to name animals, recall words after <5 mins, and decreased orientation and sequencing with clock drawing.  Pt also requiring increased time for orientation to day and year but able to complete with increased time.  PATIENT EDUCATION: Education details: ongoing condition specific education and use of pictures or grid to aid in sequencing Person educated: Patient and Spouse Education method: Explanation Education comprehension: verbalized understanding and needs further education  HOME EXERCISE PROGRAM: TBD   GOALS: Goals reviewed with patient? Yes  SHORT TERM GOALS: Target date: 10/08/22  Pt will verbalize understanding of task modifications and/or potential A/E needs to increase ease, safety, and independence w/ ADLs and IADLs. Baseline:  Goal status: IN PROGRESS  2.  Pt will be able to complete simulated visual scanning activity w/ at least 95% accuracy (finding targets around therapy gym, searching for items on shelves), incorporating visual and memory compensatory strategies prn Baseline:  Goal status: IN PROGRESS  3.  Pt will demonstrate improved memory, sequencing, and problem solving as demonstrated by improvements in score on pill box assessment by ability to complete in 5 mins  time limit with </= to 3 errors. Baseline:  Goal status: IN PROGRESS  4.   Pt will complete table top scanning activity with Supervision, min cues for recall of instructions, sequencing during task. Baseline:  Goal status: IN PROGRESS  5.   Pt will be able to demonstrate ability to alternate attention for 5 mins during table top task. Baseline:  Goal status: IN PROGRESS  LONG TERM GOALS: Target date: 11/05/22  Pt will demonstrate ability to sequence simple functional task (simple snack prep,  laundry task, etc) at Mod I level with good safety awareness and ability to utilize memory strategies for recall. Baseline:  Goal status: IN PROGRESS  2.  Pt will navigate a moderately busy environment, completing dual task activity and/or following multi-step commands with 90% accuracy Baseline:  Goal status: IN PROGRESS  3.  Pt will complete simulated medication management activity w/ Mod Ind by discharge, incorporating compensatory strategies/AE prn Baseline:  Goal status: IN PROGRESS  4.  Pt will be independent with utilization of memory strategies to allow for increased independence with managing personal appointments. Baseline:  Goal status: IN PROGRESS  5.  Pt will be able to demonstrate ability to divide attention for 5 mins during functional task. Baseline:  Goal status: IN PROGRESS   ASSESSMENT:  CLINICAL IMPRESSION: Reviewed adaptive strategies to aid in progress towards managing medications with use of "checks and balances" to decrease errors, pt demonstrating spontaneous use and understanding of further recommendations.  Pt with error with Trail B, facilitating conversation of when to point out errors and when to, safely, allow errors to facilitate learning.   PERFORMANCE DEFICITS: in functional skills including IADLs, endurance, cardiopulmonary status limiting function, decreased knowledge of precautions, and decreased knowledge of use of DME, cognitive skills including attention, memory, orientation, problem solving, and sequencing, and psychosocial skills including coping strategies, environmental adaptation, and routines and behaviors.   IMPAIRMENTS: are limiting patient from IADLs, work, and social participation.   CO-MORBIDITIES: may have co-morbidities  that affects occupational performance. Patient will benefit from skilled OT to address above impairments and improve overall function.  MODIFICATION OR ASSISTANCE TO COMPLETE EVALUATION: Min-Moderate modification of  tasks or assist with assess necessary to complete an evaluation.  OT OCCUPATIONAL PROFILE AND HISTORY: Problem focused assessment: Including review of records relating to presenting problem.  CLINICAL DECISION MAKING: Moderate - several treatment options, min-mod task modification necessary  REHAB POTENTIAL: Good  EVALUATION COMPLEXITY: Moderate    PLAN:  OT FREQUENCY: 1-2x/week  OT DURATION: 8 weeks  PLANNED INTERVENTIONS: self care/ADL training, therapeutic activity, neuromuscular re-education, functional mobility training, patient/family education, cognitive remediation/compensation, visual/perceptual remediation/compensation, psychosocial skills training, energy conservation, and coping strategies training  RECOMMENDED OTHER SERVICES: SLP   CONSULTED AND AGREED WITH PLAN OF CARE: Patient and family member/caregiver  PLAN FOR NEXT SESSION: alternating attention, educate on memory strategies (checks and balances, checking system)   Loyce Klasen, OTR/L 09/27/2022, 3:43 PM

## 2022-09-28 ENCOUNTER — Encounter (HOSPITAL_COMMUNITY)
Admission: RE | Disposition: A | Discharge status: Home / Self Care | Payer: Self-pay | Source: Home / Self Care | Attending: Cardiology

## 2022-09-28 ENCOUNTER — Encounter (HOSPITAL_COMMUNITY): Payer: Self-pay | Admitting: Cardiology

## 2022-09-28 ENCOUNTER — Ambulatory Visit (HOSPITAL_COMMUNITY)
Admission: RE | Admit: 2022-09-28 | Discharge: 2022-09-28 | Disposition: A | Discharge status: Home / Self Care | Payer: No Typology Code available for payment source | Attending: Cardiology | Admitting: Cardiology

## 2022-09-28 DIAGNOSIS — I459 Conduction disorder, unspecified: Secondary | ICD-10-CM | POA: Insufficient documentation

## 2022-09-28 DIAGNOSIS — Z8673 Personal history of transient ischemic attack (TIA), and cerebral infarction without residual deficits: Secondary | ICD-10-CM | POA: Diagnosis not present

## 2022-09-28 DIAGNOSIS — I5022 Chronic systolic (congestive) heart failure: Secondary | ICD-10-CM | POA: Insufficient documentation

## 2022-09-28 DIAGNOSIS — I429 Cardiomyopathy, unspecified: Secondary | ICD-10-CM | POA: Diagnosis not present

## 2022-09-28 DIAGNOSIS — Z7901 Long term (current) use of anticoagulants: Secondary | ICD-10-CM | POA: Insufficient documentation

## 2022-09-28 DIAGNOSIS — I428 Other cardiomyopathies: Secondary | ICD-10-CM | POA: Diagnosis present

## 2022-09-28 DIAGNOSIS — Z79899 Other long term (current) drug therapy: Secondary | ICD-10-CM | POA: Insufficient documentation

## 2022-09-28 DIAGNOSIS — I5020 Unspecified systolic (congestive) heart failure: Secondary | ICD-10-CM

## 2022-09-28 HISTORY — PX: RIGHT/LEFT HEART CATH AND CORONARY ANGIOGRAPHY: CATH118266

## 2022-09-28 LAB — POCT I-STAT EG7
Acid-base deficit: 2 mmol/L (ref 0.0–2.0)
Acid-base deficit: 2 mmol/L (ref 0.0–2.0)
Bicarbonate: 23.3 mmol/L (ref 20.0–28.0)
Bicarbonate: 24.1 mmol/L (ref 20.0–28.0)
Calcium, Ion: 1.1 mmol/L — ABNORMAL LOW (ref 1.15–1.40)
Calcium, Ion: 1.21 mmol/L (ref 1.15–1.40)
HCT: 46 % (ref 39.0–52.0)
HCT: 47 % (ref 39.0–52.0)
Hemoglobin: 15.6 g/dL (ref 13.0–17.0)
Hemoglobin: 16 g/dL (ref 13.0–17.0)
O2 Saturation: 75 %
O2 Saturation: 78 %
Potassium: 3.8 mmol/L (ref 3.5–5.1)
Potassium: 4 mmol/L (ref 3.5–5.1)
Sodium: 140 mmol/L (ref 135–145)
Sodium: 142 mmol/L (ref 135–145)
TCO2: 25 mmol/L (ref 22–32)
TCO2: 25 mmol/L (ref 22–32)
pCO2, Ven: 41.5 mmHg — ABNORMAL LOW (ref 44–60)
pCO2, Ven: 43.4 mmHg — ABNORMAL LOW (ref 44–60)
pH, Ven: 7.352 (ref 7.25–7.43)
pH, Ven: 7.357 (ref 7.25–7.43)
pO2, Ven: 42 mmHg (ref 32–45)
pO2, Ven: 45 mmHg (ref 32–45)

## 2022-09-28 LAB — CBC
HCT: 47.5 % (ref 39.0–52.0)
Hemoglobin: 15.5 g/dL (ref 13.0–17.0)
MCH: 32 pg (ref 26.0–34.0)
MCHC: 32.6 g/dL (ref 30.0–36.0)
MCV: 97.9 fL (ref 80.0–100.0)
Platelets: 218 10*3/uL (ref 150–400)
RBC: 4.85 MIL/uL (ref 4.22–5.81)
RDW: 12.8 % (ref 11.5–15.5)
WBC: 6 10*3/uL (ref 4.0–10.5)
nRBC: 0 % (ref 0.0–0.2)

## 2022-09-28 LAB — BASIC METABOLIC PANEL
Anion gap: 8 (ref 5–15)
BUN: 19 mg/dL (ref 6–20)
CO2: 27 mmol/L (ref 22–32)
Calcium: 8.7 mg/dL — ABNORMAL LOW (ref 8.9–10.3)
Chloride: 104 mmol/L (ref 98–111)
Creatinine, Ser: 1.1 mg/dL (ref 0.61–1.24)
GFR, Estimated: >60 mL/min (ref >60)
Glucose, Bld: 97 mg/dL (ref 70–99)
Potassium: 4.2 mmol/L (ref 3.5–5.1)
Sodium: 139 mmol/L (ref 135–145)

## 2022-09-28 SURGERY — RIGHT/LEFT HEART CATH AND CORONARY ANGIOGRAPHY
Anesthesia: LOCAL

## 2022-09-28 MED ORDER — APIXABAN 5 MG PO TABS: 5.0000 mg | ORAL_TABLET | Freq: Two times a day (BID) | ORAL | 3 refills | Status: DC

## 2022-09-28 MED ORDER — SODIUM CHLORIDE 0.9 % IV SOLN: 250.0000 mL | INTRAVENOUS | Status: DC | PRN

## 2022-09-28 MED ORDER — HYDRALAZINE HCL 20 MG/ML IJ SOLN: 10.0000 mg | INTRAMUSCULAR | Status: DC | PRN

## 2022-09-28 MED ORDER — HEPARIN (PORCINE) IN NACL 1000-0.9 UT/500ML-% IV SOLN
INTRAVENOUS | Status: DC | PRN
  Administered 2022-09-28 (×2): 500 mL

## 2022-09-28 MED ORDER — ONDANSETRON HCL 4 MG/2ML IJ SOLN: 4.0000 mg | Freq: Four times a day (QID) | INTRAMUSCULAR | Status: DC | PRN

## 2022-09-28 MED ORDER — SODIUM CHLORIDE 0.9% FLUSH: 3.0000 mL | Freq: Two times a day (BID) | INTRAVENOUS | Status: DC

## 2022-09-28 MED ORDER — HEPARIN SODIUM (PORCINE) 1000 UNIT/ML IJ SOLN
INTRAMUSCULAR | Status: AC
  Filled 2022-09-28: qty 10

## 2022-09-28 MED ORDER — SODIUM CHLORIDE 0.9% FLUSH: 3.0000 mL | INTRAVENOUS | Status: DC | PRN

## 2022-09-28 MED ORDER — LIDOCAINE HCL (PF) 1 % IJ SOLN
INTRAMUSCULAR | Status: DC | PRN
  Administered 2022-09-28: 5 mL

## 2022-09-28 MED ORDER — SODIUM CHLORIDE 0.9 % IV SOLN: INTRAVENOUS | Status: DC

## 2022-09-28 MED ORDER — MIDAZOLAM HCL 2 MG/2ML IJ SOLN
INTRAMUSCULAR | Status: AC
  Filled 2022-09-28: qty 2

## 2022-09-28 MED ORDER — LABETALOL HCL 5 MG/ML IV SOLN: 10.0000 mg | INTRAVENOUS | Status: DC | PRN

## 2022-09-28 MED ORDER — VERAPAMIL HCL 2.5 MG/ML IV SOLN
INTRAVENOUS | Status: DC | PRN
  Administered 2022-09-28: 10 mL via INTRA_ARTERIAL

## 2022-09-28 MED ORDER — ACETAMINOPHEN 325 MG PO TABS: 650.0000 mg | ORAL_TABLET | ORAL | Status: DC | PRN

## 2022-09-28 MED ORDER — HEPARIN SODIUM (PORCINE) 1000 UNIT/ML IJ SOLN
INTRAMUSCULAR | Status: DC | PRN
  Administered 2022-09-28: 3000 [IU] via INTRAVENOUS

## 2022-09-28 MED ORDER — LIDOCAINE HCL (PF) 1 % IJ SOLN
INTRAMUSCULAR | Status: AC
  Filled 2022-09-28: qty 30

## 2022-09-28 MED ORDER — FENTANYL CITRATE (PF) 100 MCG/2ML IJ SOLN
INTRAMUSCULAR | Status: AC
  Filled 2022-09-28: qty 2

## 2022-09-28 MED ORDER — FENTANYL CITRATE (PF) 100 MCG/2ML IJ SOLN
INTRAMUSCULAR | Status: DC | PRN
  Administered 2022-09-28: 25 ug via INTRAVENOUS

## 2022-09-28 MED ORDER — IOHEXOL 350 MG/ML SOLN
INTRAVENOUS | Status: DC | PRN
  Administered 2022-09-28: 30 mL

## 2022-09-28 MED ORDER — MIDAZOLAM HCL 2 MG/2ML IJ SOLN
INTRAMUSCULAR | Status: DC | PRN
  Administered 2022-09-28: 1 mg via INTRAVENOUS

## 2022-09-28 MED ORDER — ASPIRIN 81 MG PO CHEW
81.0000 mg | CHEWABLE_TABLET | Freq: Once | ORAL | Status: AC
  Administered 2022-09-28: 81 mg via ORAL
  Filled 2022-09-28: qty 1

## 2022-09-28 MED ORDER — VERAPAMIL HCL 2.5 MG/ML IV SOLN
INTRAVENOUS | Status: AC
  Filled 2022-09-28: qty 2

## 2022-09-28 SURGICAL SUPPLY — 11 items
CATH 5FR JL3.5 JR4 ANG PIG MP (CATHETERS) IMPLANT
CATH BALLN WEDGE 5F 110CM (CATHETERS) IMPLANT
DEVICE RAD COMP TR BAND LRG (VASCULAR PRODUCTS) IMPLANT
GLIDESHEATH SLEND SS 6F .021 (SHEATH) IMPLANT
GUIDEWIRE INQWIRE 1.5J.035X260 (WIRE) IMPLANT
INQWIRE 1.5J .035X260CM (WIRE) ×1
KIT HEART LEFT (KITS) ×1 IMPLANT
PACK CARDIAC CATHETERIZATION (CUSTOM PROCEDURE TRAY) ×1 IMPLANT
SHEATH GLIDE SLENDER 4/5FR (SHEATH) IMPLANT
SHEATH PROBE COVER 6X72 (BAG) IMPLANT
TRANSDUCER W/STOPCOCK (MISCELLANEOUS) ×1 IMPLANT

## 2022-09-28 NOTE — Discharge Instructions (Addendum)
Restart Eliquis tomorrow morning.        Radial Site Care  This sheet gives you information about how to care for yourself after your procedure. Your health care provider may also give you more specific instructions. If you have problems or questions, contact your health care provider. What can I expect after the procedure? After the procedure, it is common to have: Bruising and tenderness at the catheter insertion area. Follow these instructions at home: Medicines Take over-the-counter and prescription medicines only as told by your health care provider. Insertion site care Follow instructions from your health care provider about how to take care of your insertion site. Make sure you: Wash your hands with soap and water before you remove your bandage (dressing). If soap and water are not available, use hand sanitizer. May remove dressing in 24 hours. Check your insertion site every day for signs of infection. Check for: Redness, swelling, or pain. Fluid or blood. Pus or a bad smell. Warmth. Do no take baths, swim, or use a hot tub for 5 days. You may shower 24-48 hours after the procedure. Remove the dressing and gently wash the site with plain soap and water. Pat the area dry with a clean towel. Do not rub the site. That could cause bleeding. Do not apply powder or lotion to the site. Activity  For 24 hours after the procedure, or as directed by your health care provider: Do not flex or bend the affected arm. Do not push or pull heavy objects with the affected arm. Do not drive yourself home from the hospital or clinic. You may drive 24 hours after the procedure. Do not operate machinery or power tools. KEEP ARM ELEVATED THE REMAINDER OF THE DAY. Do not push, pull or lift anything that is heavier than 10 lb for 5 days. Ask your health care provider when it is okay to: Return to work or school. Resume usual physical activities or sports. Resume sexual activity. General  instructions If the catheter site starts to bleed, raise your arm and put firm pressure on the site. If the bleeding does not stop, get help right away. This is a medical emergency. DRINK PLENTY OF FLUIDS FOR THE NEXT 2-3 DAYS. No alcohol consumption for 24 hours after receiving sedation. If you went home on the same day as your procedure, a responsible adult should be with you for the first 24 hours after you arrive home. Keep all follow-up visits as told by your health care provider. This is important. Contact a health care provider if: You have a fever. You have redness, swelling, or yellow drainage around your insertion site. Get help right away if: You have unusual pain at the radial site. The catheter insertion area swells very fast. The insertion area is bleeding, and the bleeding does not stop when you hold steady pressure on the area. Your arm or hand becomes pale, cool, tingly, or numb. These symptoms may represent a serious problem that is an emergency. Do not wait to see if the symptoms will go away. Get medical help right away. Call your local emergency services (911 in the [place] Do not drive yourself to the hospital. Summary After the procedure, it is common to have bruising and tenderness at the site. Follow instructions from your health care provider about how to take care of your radial site wound. Check the wound every day for signs of infection.  This information is not intended to replace advice given to you by your health  care provider. Make sure you discuss any questions you have with your health care provider. Document Revised: 06/08/2017 Document Reviewed: 06/08/2017 Elsevier Patient Education  2020 [employer]   Brachial Site Care   This sheet gives you information about how to care for yourself after your procedure. Your health care provider may also give you more specific instructions. If you have problems or questions, contact your health care provider. What  can I expect after the procedure? After the procedure, it is common to have: Bruising and tenderness at the catheter insertion area. Follow these instructions at home:  Insertion site care Follow instructions from your health care provider about how to take care of your insertion site. Make sure you: Wash your hands with soap and water before you change your bandage (dressing). If soap and water are not available, use hand sanitizer. Remove your dressing as told by your health care provider. In 24 hours Check your insertion site every day for signs of infection. Check for: Redness, swelling, or pain. Pus or a bad smell. Warmth. You may shower 24-48 hours after the procedure. Do not apply powder or lotion to the site.  Activity For 24 hours after the procedure, or as directed by your health care provider: Do not push or pull heavy objects with the affected arm. Do not drive yourself home from the hospital or clinic. You may drive 24 hours after the procedure unless your health care provider tells you not to. Do not lift anything that is heavier than 10 lb (4.5 kg), or the limit that you are told, until your health care provider says that it is safe.  For 24 hours

## 2022-09-28 NOTE — Interval H&P Note (Signed)
History and Physical Interval Note:  09/28/2022 10:23 AM  Luke Jimenez  has presented today for surgery, with the diagnosis of HF.  The various methods of treatment have been discussed with the patient and family. After consideration of risks, benefits and other options for treatment, the patient has consented to  Procedure(s): RIGHT/LEFT HEART CATH AND CORONARY ANGIOGRAPHY (N/A) as a surgical intervention.  The patient's history has been reviewed, patient examined, no change in status, stable for surgery.  I have reviewed the patient's chart and labs.  Questions were answered to the patient's satisfaction.     Sara Selvidge [employer]

## 2022-09-29 ENCOUNTER — Encounter (HOSPITAL_COMMUNITY): Payer: Self-pay | Admitting: Cardiology

## 2022-09-29 NOTE — Therapy (Unsigned)
OUTPATIENT SPEECH LANGUAGE PATHOLOGY TREATMENT   Patient Name: Luke Jimenez MRN: [id number] DOB:04-01-1966, 57 y.o., male Today's Date: 09/30/2022  PCP: Shon Hale MD (Documentation) REFERRING PROVIDER: Meredeth Ide, MD  END OF SESSION:  End of Session - 09/30/22 1313     Visit Number 5    Number of Visits 17    Date for SLP Re-Evaluation 11/17/22    SLP Start Time 1315    SLP Stop Time  1404    SLP Time Calculation (min) 49 min    Activity Tolerance Patient tolerated treatment well               Past Medical History:  Diagnosis Date   Bradycardia    Second degree AV block    Stroke [facility])    Past Surgical History:  Procedure Laterality Date   BUBBLE STUDY  08/13/2022   Procedure: BUBBLE STUDY;  Surgeon: Christell Constant, MD;  Location: [facility] ENDOSCOPY;  Service: Cardiovascular;;   PACEMAKER IMPLANT N/A 12/20/2019   Procedure: PACEMAKER IMPLANT;  Surgeon: Hillis Range, MD;  Location: [facility] INVASIVE CV LAB;  Service: Cardiovascular;  Laterality: N/A;   RIGHT/LEFT HEART CATH AND CORONARY ANGIOGRAPHY N/A 09/28/2022   Procedure: RIGHT/LEFT HEART CATH AND CORONARY ANGIOGRAPHY;  Surgeon: Laurey Morale, MD;  Location: [facility] INVASIVE CV LAB;  Service: Cardiovascular;  Laterality: N/A;   TEE WITHOUT CARDIOVERSION N/A 08/13/2022   Procedure: TRANSESOPHAGEAL ECHOCARDIOGRAM (TEE);  Surgeon: Christell Constant, MD;  Location: [facility] ENDOSCOPY;  Service: Cardiovascular;  Laterality: N/A;   Patient Active Problem List   Diagnosis Date Noted   Heart failure, type unknown (HCC) 09/06/2022   Cardiomyopathy (HCC) 08/13/2022   Cerebrovascular accident (CVA) due to embolism of precerebral artery (HCC) 08/13/2022   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) 08/11/2022   Cryptogenic stroke (HCC) 08/11/2022   Seizure (HCC) 08/10/2022   AMS (altered mental status) 08/09/2022   ATRIOVENTRICULAR BLOCK, 2ND DEGREE 06/17/2010    ONSET DATE: 08/09/22   REFERRING  DIAG:  I63.10 (ICD-10-CM) - Cerebrovascular accident (CVA) due to embolism of precerebral artery    THERAPY DIAG:  Aphasia  Cognitive communication deficit  Dyslexia and alexia  Rationale for Evaluation and Treatment: Rehabilitation  SUBJECTIVE:   SUBJECTIVE STATEMENT: "I brought this back" re: HEP from [place] Pt accompanied by: significant other wife [name]  PERTINENT HISTORY: Pt is a 57 y.o. male who presented to ED with complaints of nonspecific complaints of feeling off Pt family concerned that he was having difficulty with speaking over the phone. Also was having difficulty finding words with forgetfulness. Pt had slight headache yesterday but no other symptoms. CT head (08/09/22) showed hypodensity in the left temporal lobe suspicious for acute infarct. MRI brain (08/11/22) revealed "Acute left PCA territory infarcts". EEG (08/12/22) "suggestive of cortical dysfunction arising from left temporal region likely secondary to underlying stroke, post-ictal state. No further seizures were noted". PMH: second-degree AV block with pacemaker  PAIN:  Are you having pain? No  FALLS: Has patient fallen in last 6 months?  No  PATIENT GOALS: Improve reading to be able to return to work  OBJECTIVE:   DIAGNOSTIC FINDINGS:  SLE 08/12/22: Assessment / Plan / Recommendation Clinical Impression     Pt presents with primarily cognitive communication deficits, with very few word finding difficulites and likely visual recognition deficits. He is oriented fully to self and place with difficulty recalling year (1994) and reason for hospitalization. Immediate recall of listed items and perfomance with simple  mathematical calculation were [facility]. Cognitive deificts noted were in the areas of delayed recall (2/5 items recalled), complex verbal problem solving and executive function (inferencing, self-monitoring/correcting). During informal conversation, no word finding or expressive language difficulties noted.  Perseveration/hesistation exhibited during confrontational naming task in x1 instance. Receptive language appeared [facility] for tasks provided. During reading (words/sentences) and picture scene task, pt with difficulty recognizing/discriminating few pictured objects/words. Some hesitations evident during reading task and pt reports no issues with visual acuity, but with some delay in recognizing word. Would need to r/o any other visual deficits to determine if solely a recognition/discrimination issue. Recommend SLP services f/u acutely to treat primarily for cognitive communication function. Ongoing assessment/ dx treatment for language function also warranted. Wife present in room/via phone for majority of session is in agreement with recommendations.       PATIENT REPORTED OUTCOME MEASURES (PROM): Cognitive Function: to be provided during first 2 sessions.   TODAY'S TREATMENT:                                                                                                                                         DATE:  09/30/22: Further targeted and analyzed reading today. Pt endorsed some reduced word recognition, particularly with consonant clusters and multisyllabic words. Targeted oral reading of Rainbow passage. SLP noted intermittent delays with more common sight words (ex: white, beautiful) as well as possible phonemic paraphasias (ex: when/then, universal/unusual, bright/bridge). Only self-corrected error x1. Encouraged metacognitive analysis of reading ability, in which pt identified not attending to entire word resulting in errors. Recommended visual supports (finger, visual marker) to aid attention and processing speed. Targeted minimal pairs, in which pt required some extended time and occasional mod A to correct errors. Recommended functional reading at home to aid word recognition.   09/27/22: Addressed reading with use of compensations to aid in attention. Pt benefiting from oral reading,  aiding in recall of x7 pertinent details from paragraph level stimuli, in spite of occasional disruption in reading fluency. Provided education on use of text to speech settings on apple products to aid in reading practice. Recommend reading with support, then returning and re-reading IND to aid in increased fluency. Pt to trial reading aloud to optimize attention and recall of read material.   09/23/22: Pt and wife and SLP talked about pt's cognitive deficits - pt and wife agreed pt's overall processing is slower than before CVA. SLP could initiate (or complete) CLQT with pt next session. Romir also endorses some deficits in higher level attention ("I just have to do one thing and then do the other thing - I can't do them together anymore."). With extra time allowed, pt showed good (not excellent) insight what he might need to tell his supervisor if he were told to return to work this week. During this discussion pt indicated he was more tired now than pre-CVA. SLP educated pt about  possible need to return to work at first with part time status and explained rationale for this. SLP continued the Reading Comprehenstion Battery of Aphasia - 2nd Ed. (RCBA-2); SLP-Schinke to complete the last subtest the week of 10-04-22.  09/14/22: SLP initiated Reading Comprehenstion Battery of Aphasia - 2nd Ed. (RCBA-2). 8/10 subtests completed.   Pt with incr'd time necessary for all subtests. Single words - choice of f:3 words to match to pic took approx 13 seconds per picture. SLP to cont assessment next session. SLP told pt to continue to read at home - pt states he is getting good practice reading text messages.   09/08/22: Suggested home tasks for pt and wife  PATIENT EDUCATION: Education details: Results of eval, therapy course, home tasks Person educated: Patient and Spouse Education method: Explanation, Demonstration, and Verbal cues Education comprehension: verbalized understanding, returned demonstration, verbal  cues required, and needs further education   GOALS: Goals reviewed with patient? No  SHORT TERM GOALS: Target date: 10/14/22  Pt will read 8-10 word sentence selections and indicate correct answers to written or spoken questions 100% success with extra time in 3 sessions Baseline: Goal status: IN PROGRESS  2.  Pt will demo understanding of mod complex emails of 10+ sentences with 100% success with compensations, in three sessions Baseline:  Goal status: IN PROGRESS  3.  Pt will write semi-technical 10+ sentence (work-like) messages and/or text messages with 100% success in 3 sessions Baseline:  Goal status: IN PROGRESS  4.  Pt will undergo cognitive linguistic testing if clinically necessary Baseline:  Goal status: IN PROGRESS   LONG TERM GOALS: Target date: 11/16/22  Pt will have higher/better score on PROM compared to initial reading Baseline:  Goal status: IN PROGRESS  2.  Pt will read 15+ sentence mod complex selections with compensations in 3 sessions Baseline:  Goal status: IN PROGRESS  3.  Pt will write 10+ sentence selections (work like emails, texts, etc)  with compensations in 3 sessions Baseline:  Goal status: IN PROGRESS   ASSESSMENT:  CLINICAL IMPRESSION: Patient is a 57 y.o. male who was seen today for treatment of cognitive linguistics and with reading in light of CVAs in March. See today's "treatment" section for additional details. Pt's OT stated pt demonstrated cognitive deficits such as processing/mental calculation, and memory. SLP to ssess pt's cognitive linguistics next session and add goals as necessary. 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    OBJECTIVE IMPAIRMENTS: include memory, expressive language, and aphasia. These impairments are limiting patient from return to work, ADLs/IADLs, and effectively communicating at home and in community. Factors affecting potential to achieve  goals and functional outcome are ability to learn/carryover information. Patient will benefit from skilled SLP services to address above impairments and improve overall function.  REHAB POTENTIAL: Good  PLAN:  SLP FREQUENCY: 2x/week  SLP DURATION: 8 weeks  PLANNED INTERVENTIONS: Language facilitation, Environmental controls, Cueing hierachy, Cognitive reorganization, Internal/external aids, Functional tasks, Multimodal communication approach, SLP instruction and feedback, Compensatory strategies, and Patient/family education    Gracy Racer, CCC-SLP 09/30/2022, 2:59 PM

## 2022-09-30 ENCOUNTER — Ambulatory Visit: Payer: No Typology Code available for payment source

## 2022-09-30 ENCOUNTER — Ambulatory Visit: Payer: No Typology Code available for payment source | Admitting: Occupational Therapy

## 2022-09-30 DIAGNOSIS — R41841 Cognitive communication deficit: Secondary | ICD-10-CM

## 2022-09-30 DIAGNOSIS — R4701 Aphasia: Secondary | ICD-10-CM | POA: Diagnosis not present

## 2022-09-30 DIAGNOSIS — I69318 Other symptoms and signs involving cognitive functions following cerebral infarction: Secondary | ICD-10-CM

## 2022-09-30 DIAGNOSIS — R41840 Attention and concentration deficit: Secondary | ICD-10-CM

## 2022-09-30 DIAGNOSIS — R480 Dyslexia and alexia: Secondary | ICD-10-CM

## 2022-09-30 NOTE — Therapy (Signed)
OUTPATIENT OCCUPATIONAL THERAPY NEURO Treatment Note  Patient Name: Luke Jimenez MRN: [id number] DOB:11/02/1965, 57 y.o., male Today's Date: 09/30/2022  PCP: Shon Hale, MD REFERRING PROVIDER: Meredeth Ide, MD  END OF SESSION:  OT End of Session - 09/30/22 1406     Visit Number 4    Number of Visits 13    Date for OT Re-Evaluation 11/05/22    Authorization Type [employer] Preferred    OT Start Time 1405    OT Stop Time 1445    OT Time Calculation (min) 40 min               Past Medical History:  Diagnosis Date   Bradycardia    Second degree AV block    Stroke [facility])    Past Surgical History:  Procedure Laterality Date   BUBBLE STUDY  08/13/2022   Procedure: BUBBLE STUDY;  Surgeon: Christell Constant, MD;  Location: [facility] ENDOSCOPY;  Service: Cardiovascular;;   PACEMAKER IMPLANT N/A 12/20/2019   Procedure: PACEMAKER IMPLANT;  Surgeon: Hillis Range, MD;  Location: [facility] INVASIVE CV LAB;  Service: Cardiovascular;  Laterality: N/A;   RIGHT/LEFT HEART CATH AND CORONARY ANGIOGRAPHY N/A 09/28/2022   Procedure: RIGHT/LEFT HEART CATH AND CORONARY ANGIOGRAPHY;  Surgeon: Laurey Morale, MD;  Location: [facility] INVASIVE CV LAB;  Service: Cardiovascular;  Laterality: N/A;   TEE WITHOUT CARDIOVERSION N/A 08/13/2022   Procedure: TRANSESOPHAGEAL ECHOCARDIOGRAM (TEE);  Surgeon: Christell Constant, MD;  Location: [facility] ENDOSCOPY;  Service: Cardiovascular;  Laterality: N/A;   Patient Active Problem List   Diagnosis Date Noted   Heart failure, type unknown (HCC) 09/06/2022   Cardiomyopathy (HCC) 08/13/2022   Cerebrovascular accident (CVA) due to embolism of precerebral artery (HCC) 08/13/2022   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) 08/11/2022   Cryptogenic stroke (HCC) 08/11/2022   Seizure (HCC) 08/10/2022   AMS (altered mental status) 08/09/2022   ATRIOVENTRICULAR BLOCK, 2ND DEGREE 06/17/2010    ONSET DATE: 08/09/22  REFERRING DIAG: I63.10 (ICD-10-CM)  - Cerebrovascular accident (CVA) due to embolism of precerebral artery  THERAPY DIAG:  Attention and concentration deficit  Other symptoms and signs involving cognitive functions following cerebral infarctionLama, Sarina Ill, MD  Rationale for Evaluation and Treatment: Rehabilitation  SUBJECTIVE:   SUBJECTIVE STATEMENT: Pt reports having a heart cath on Tues and is fatigued s/p. Pt accompanied by: self and significant other (spouse [name])  PERTINENT HISTORY: Pt is a 57 y.o. male who presented 08/09/22 with speech difficulty and AMS. 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 EEG also showing seizures. PMH: second-degree AV block with pacemaker, bradycardia  PRECAUTIONS: None  WEIGHT BEARING RESTRICTIONS: No  PAIN:  Are you having pain? No  FALLS: Has patient fallen in last 6 months? No  LIVING ENVIRONMENT: Lives with: lives with their spouse and family nearby Lives in: House/apartment Stairs: Yes: Internal: bedroom/bathroom up full flight of steps; and External: 4-5 steps Has following equipment at home: Grab bars and built in shower seat, hand held shower head  PLOF: Independent, Independent with basic ADLs, and Vocation/Vocational requirements: Quarry manager   PATIENT GOALS: to improve in reading and short term memory  OBJECTIVE:   HAND DOMINANCE: Right  IADLs: Shopping: has gone to store with spouse, with ability to walk away to retreive an item and return to her  Meal Prep: microwave to warm up leftovers, but did not really cook before Community mobility: not cleared to drive Medication management: spouse is Designer, television/film set  management: spouse takes care of and did before  COGNITION: Overall cognitive status:  Impaired with significant impairments to time orientation, sequencing, attention, and short term memory/recall  VISION: Subjective report: no changes  VISION ASSESSMENT: To be further assessed in  functional context While in hospital, OT noted questionable R inattention, to be further assessed in subsequent visits  Patient has difficulty with following activities due to following visual impairments: reading - unsure if attention, memory, or visual  TODAY'S TREATMENT:                                    09/30/22 Recall: Pt with difficulty with recall of what he had completed during SLP session.  Pt requiring question cues and min assist from spouse with increased recall and details afterwards. Attention: engaged in word search for attention with focus on aspects of reading with visual scanning and organized processing.  OT incorporating intermittent conversation to challenge increased selective and alternating attention.  Pt with good ability to select attention, but significant difficulty with alternating attention. Engaged in Menu reading to challenge attention to reading and recall with identification of number of items in each category and incorporating mental math when provided with budget when purchasing items from menu.    09/27/22 Pill box assessment: Pt completed in 4:41 with no errors.  Engaged in discussion of "checks and balances" observed that increased pt success as well as how to further increase success.  Pt opening only slots/rows as needed per "prescription" allowing for decreased error.  Discussed organizing meds in progressive order to allow for increased ease/success.  Discussed increasing active participation in meds to decrease burden on caregiver and increase independence. Trail making test: Trail A: 32 sec with no errors and Trail B: 1:32 with one error.  OT educated on functional carryover of alternating attention of Trail B and how to carry over to additional functional tasks.  Encouraged participation in aspects of household tasks to increase processing speed, recall, and sequencing.  Discussed safe failure (within reason) to allow for education and increased  independence.    09/14/22 Engaged in small peg board pattern replication with use of key to identify correlating colors, visual perception, and attention to task.  Pt initially following line in linear path, however noticed that he would forget which peg color he was searching for before obtaining peg and would then lose his place quickly.  Pt reports he would almost have to start over to locate and correct error.  Discussed use of bookmark/line follow to increase place keeping and assist with completion of task.  Downgraded to completing peg task with pattern only (no key) with use of bookmark for line follow.  Pt with improved ability to sequence and correct errors. Educated on functional carryover of task implications with systematic process to minimize onset of errors. Medication management: OT providing recommendations to make a grid with numbers and descriptions of medications and/or use of picture of filled pill box to allow for improved "checks and balances" as progressing to taking on a more active roll in administering medications.  PATIENT EDUCATION: Education details: ongoing condition specific education and use of pictures or grid to aid in sequencing Person educated: Patient and Spouse Education method: Explanation Education comprehension: verbalized understanding and needs further education  HOME EXERCISE PROGRAM: TBD   GOALS: Goals reviewed with patient? Yes  SHORT TERM GOALS: Target date: 10/08/22  Pt will verbalize understanding  of task modifications and/or potential A/E needs to increase ease, safety, and independence w/ ADLs and IADLs. Baseline:  Goal status: IN PROGRESS  2.  Pt will be able to complete simulated visual scanning activity w/ at least 95% accuracy (finding targets around therapy gym, searching for items on shelves), incorporating visual and memory compensatory strategies prn Baseline:  Goal status: IN PROGRESS  3.  Pt will demonstrate improved memory,  sequencing, and problem solving as demonstrated by improvements in score on pill box assessment by ability to complete in 5 mins time limit with </= to 3 errors. Baseline:  Goal status: IN PROGRESS  4.   Pt will complete table top scanning activity with Supervision, min cues for recall of instructions, sequencing during task. Baseline:  Goal status: IN PROGRESS  5.   Pt will be able to demonstrate ability to alternate attention for 5 mins during table top task. Baseline:  Goal status: IN PROGRESS  LONG TERM GOALS: Target date: 11/05/22  Pt will demonstrate ability to sequence simple functional task (simple snack prep, laundry task, etc) at Mod I level with good safety awareness and ability to utilize memory strategies for recall. Baseline:  Goal status: IN PROGRESS  2.  Pt will navigate a moderately busy environment, completing dual task activity and/or following multi-step commands with 90% accuracy Baseline:  Goal status: IN PROGRESS  3.  Pt will complete simulated medication management activity w/ Mod Ind by discharge, incorporating compensatory strategies/AE prn Baseline:  Goal status: IN PROGRESS  4.  Pt will be independent with utilization of memory strategies to allow for increased independence with managing personal appointments. Baseline:  Goal status: IN PROGRESS  5.  Pt will be able to demonstrate ability to divide attention for 5 mins during functional task. Baseline:  Goal status: IN PROGRESS   ASSESSMENT:  CLINICAL IMPRESSION: Reviewed adaptive strategies to aid in progress towards increased selective and alternating attention.  Encouraged slow increase in challenges and discussing how to upgrade and downgrade tasks to complete just right challenge.  Pt with improved reading and recall with reference to menu throughout task.  PERFORMANCE DEFICITS: in functional skills including IADLs, endurance, cardiopulmonary status limiting function, decreased knowledge of  precautions, and decreased knowledge of use of DME, cognitive skills including attention, memory, orientation, problem solving, and sequencing, and psychosocial skills including coping strategies, environmental adaptation, and routines and behaviors.   IMPAIRMENTS: are limiting patient from IADLs, work, and social participation.   CO-MORBIDITIES: may have co-morbidities  that affects occupational performance. Patient will benefit from skilled OT to address above impairments and improve overall function.  MODIFICATION OR ASSISTANCE TO COMPLETE EVALUATION: Min-Moderate modification of tasks or assist with assess necessary to complete an evaluation.  OT OCCUPATIONAL PROFILE AND HISTORY: Problem focused assessment: Including review of records relating to presenting problem.  CLINICAL DECISION MAKING: Moderate - several treatment options, min-mod task modification necessary  REHAB POTENTIAL: Good  EVALUATION COMPLEXITY: Moderate    PLAN:  OT FREQUENCY: 1-2x/week  OT DURATION: 8 weeks  PLANNED INTERVENTIONS: self care/ADL training, therapeutic activity, neuromuscular re-education, functional mobility training, patient/family education, cognitive remediation/compensation, visual/perceptual remediation/compensation, psychosocial skills training, energy conservation, and coping strategies training  RECOMMENDED OTHER SERVICES: SLP   CONSULTED AND AGREED WITH PLAN OF CARE: Patient and family member/caregiver  PLAN FOR NEXT SESSION: alternating attention, educate on memory strategies (checks and balances, checking system)   Ege Muckey, OTR/L 09/30/2022, 3:51 PM

## 2022-10-05 ENCOUNTER — Ambulatory Visit: Payer: No Typology Code available for payment source | Admitting: Occupational Therapy

## 2022-10-05 ENCOUNTER — Ambulatory Visit: Payer: No Typology Code available for payment source

## 2022-10-05 DIAGNOSIS — R4701 Aphasia: Secondary | ICD-10-CM | POA: Diagnosis not present

## 2022-10-05 DIAGNOSIS — R480 Dyslexia and alexia: Secondary | ICD-10-CM

## 2022-10-05 DIAGNOSIS — R41840 Attention and concentration deficit: Secondary | ICD-10-CM

## 2022-10-05 DIAGNOSIS — R41841 Cognitive communication deficit: Secondary | ICD-10-CM

## 2022-10-05 DIAGNOSIS — I69318 Other symptoms and signs involving cognitive functions following cerebral infarction: Secondary | ICD-10-CM

## 2022-10-05 NOTE — Patient Instructions (Signed)
   Recommend visual supports (finger, visual marker) to aid attention and processing speed.   Recommend highlighting words you had difficulty with   Recommend re-reading the selection to improve reading speed.  I would also recommend printing words you missed reading through the selection the first time  - spell the word as you print it, then read the word out loud two-three times.

## 2022-10-05 NOTE — Therapy (Signed)
OUTPATIENT SPEECH LANGUAGE PATHOLOGY TREATMENT   Patient Name: Luke Jimenez MRN: [id number] DOB:February 26, 1966, 57 y.o., male Today's Date: 10/05/2022  PCP: Shon Hale MD (Documentation) REFERRING PROVIDER: Meredeth Ide, MD  END OF SESSION:  End of Session - 10/05/22 0938     Visit Number 6    Number of Visits 17    Date for SLP Re-Evaluation 11/17/22    SLP Start Time 0936    SLP Stop Time  1015    SLP Time Calculation (min) 39 min    Activity Tolerance Patient tolerated treatment well               Past Medical History:  Diagnosis Date   Bradycardia    Second degree AV block    Stroke [facility])    Past Surgical History:  Procedure Laterality Date   BUBBLE STUDY  08/13/2022   Procedure: BUBBLE STUDY;  Surgeon: Christell Constant, MD;  Location: [facility] ENDOSCOPY;  Service: Cardiovascular;;   PACEMAKER IMPLANT N/A 12/20/2019   Procedure: PACEMAKER IMPLANT;  Surgeon: Hillis Range, MD;  Location: [facility] INVASIVE CV LAB;  Service: Cardiovascular;  Laterality: N/A;   RIGHT/LEFT HEART CATH AND CORONARY ANGIOGRAPHY N/A 09/28/2022   Procedure: RIGHT/LEFT HEART CATH AND CORONARY ANGIOGRAPHY;  Surgeon: Laurey Morale, MD;  Location: [facility] INVASIVE CV LAB;  Service: Cardiovascular;  Laterality: N/A;   TEE WITHOUT CARDIOVERSION N/A 08/13/2022   Procedure: TRANSESOPHAGEAL ECHOCARDIOGRAM (TEE);  Surgeon: Christell Constant, MD;  Location: [facility] ENDOSCOPY;  Service: Cardiovascular;  Laterality: N/A;   Patient Active Problem List   Diagnosis Date Noted   Heart failure, type unknown (HCC) 09/06/2022   Cardiomyopathy (HCC) 08/13/2022   Cerebrovascular accident (CVA) due to embolism of precerebral artery (HCC) 08/13/2022   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) 08/11/2022   Cryptogenic stroke (HCC) 08/11/2022   Seizure (HCC) 08/10/2022   AMS (altered mental status) 08/09/2022   ATRIOVENTRICULAR BLOCK, 2ND DEGREE 06/17/2010    ONSET DATE: 08/09/22   REFERRING  DIAG:  I63.10 (ICD-10-CM) - Cerebrovascular accident (CVA) due to embolism of precerebral artery    THERAPY DIAG:  Aphasia  Cognitive communication deficit  Dyslexia and alexia  Rationale for Evaluation and Treatment: Rehabilitation  SUBJECTIVE:   SUBJECTIVE STATEMENT: "I brought this back" re: HEP from [name] accompanied by: significant other wife [name]  PERTINENT HISTORY: Pt is a 57 y.o. male who presented to ED with complaints of nonspecific complaints of feeling off Pt family concerned that he was having difficulty with speaking over the phone. Also was having difficulty finding words with forgetfulness. Pt had slight headache yesterday but no other symptoms. CT head (08/09/22) showed hypodensity in the left temporal lobe suspicious for acute infarct. MRI brain (08/11/22) revealed "Acute left PCA territory infarcts". EEG (08/12/22) "suggestive of cortical dysfunction arising from left temporal region likely secondary to underlying stroke, post-ictal state. No further seizures were noted". PMH: second-degree AV block with pacemaker  PAIN:  Are you having pain? No  FALLS: Has patient fallen in last 6 months?  No  PATIENT GOALS: Improve reading to be able to return to work  OBJECTIVE:   DIAGNOSTIC FINDINGS:  SLE 08/12/22: Assessment / Plan / Recommendation Clinical Impression     Pt presents with primarily cognitive communication deficits, with very few word finding difficulites and likely visual recognition deficits. He is oriented fully to self and place with difficulty recalling year (1994) and reason for hospitalization. Immediate recall of listed items and perfomance with simple  mathematical calculation were [facility]. Cognitive deificts noted were in the areas of delayed recall (2/5 items recalled), complex verbal problem solving and executive function (inferencing, self-monitoring/correcting). During informal conversation, no word finding or expressive language difficulties noted.  Perseveration/hesistation exhibited during confrontational naming task in x1 instance. Receptive language appeared [facility] for tasks provided. During reading (words/sentences) and picture scene task, pt with difficulty recognizing/discriminating few pictured objects/words. Some hesitations evident during reading task and pt reports no issues with visual acuity, but with some delay in recognizing word. Would need to r/o any other visual deficits to determine if solely a recognition/discrimination issue. Recommend SLP services f/u acutely to treat primarily for cognitive communication function. Ongoing assessment/ dx treatment for language function also warranted. Wife present in room/via phone for majority of session is in agreement with recommendations.       PATIENT REPORTED OUTCOME MEASURES (PROM): To be completed week of 10/04/22.   TODAY'S TREATMENT:                                                                                                                                         DATE:  10/04/21: SLP suggested pt and wife do more reading homework with the suggestions by SLP implemented (see pt instructions). Pt and wife will complete reading homework together for 25-30 minutes, x5 days/week.  SLP asked pt to think about how these deficits would impact him at work and pt stated he would need to write more notes for recall than he did prior to CVA. Pt does not know how reading deficit will affect him at work due to [employer] being another language, with other context cues.  09/30/22: Further targeted and analyzed reading today. Pt endorsed some reduced word recognition, particularly with consonant clusters and multisyllabic words. Targeted oral reading of Rainbow passage. SLP noted intermittent delays with more common sight words (ex: white, beautiful) as well as possible phonemic paraphasias (ex: when/then, universal/unusual, bright/bridge). Only self-corrected error x1. Encouraged metacognitive analysis  of reading ability, in which pt identified not attending to entire word resulting in errors. Recommended visual supports (finger, visual marker) to aid attention and processing speed. Targeted minimal pairs, in which pt required some extended time and occasional mod A to correct errors. Recommended functional reading at home to aid word recognition.   09/27/22: Addressed reading with use of compensations to aid in attention. Pt benefiting from oral reading, aiding in recall of x7 pertinent details from paragraph level stimuli, in spite of occasional disruption in reading fluency. Provided education on use of text to speech settings on [employer] products to aid in reading practice. Recommend reading with support, then returning and re-reading IND to aid in increased fluency. Pt to trial reading aloud to optimize attention and recall of read material.   09/23/22: Pt and wife and SLP talked about pt's cognitive deficits - pt and wife agreed pt's overall processing is slower than  before CVA. SLP could initiate (or complete) CLQT with pt next session. Skanda also endorses some deficits in higher level attention ("I just have to do one thing and then do the other thing - I can't do them together anymore."). With extra time allowed, pt showed good (not excellent) insight what he might need to tell his supervisor if he were told to return to work this week. During this discussion pt indicated he was more tired now than pre-CVA. 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 SLP continued the Reading Comprehenstion Battery of Aphasia - 2nd Ed. (RCBA-2); SLP-Keiona Jenison to complete the last subtest the week of 10-04-22.  09/14/22: SLP initiated Reading Comprehenstion Battery of Aphasia - 2nd Ed. (RCBA-2). 8/10 subtests completed.   Pt with incr'd time necessary for all subtests. Single words - choice of f:3 words to match to pic took approx 13 seconds per  picture. SLP to cont assessment next session. SLP told pt to continue to read at home - pt states he is getting good practice reading text messages.   09/08/22: Suggested home tasks for pt and wife  PATIENT EDUCATION: Education details: Results of eval, therapy course, home tasks Person educated: Patient and Spouse Education method: Explanation, Demonstration, and Verbal cues Education comprehension: verbalized understanding, returned demonstration, verbal cues required, and needs further education   GOALS: Goals reviewed with patient? No  SHORT TERM GOALS: Target date: 10/14/22  Pt will read 8-10 word sentence selections and indicate correct answers to written or spoken questions 100% success with extra time in 3 sessions Baseline: Goal status: IN PROGRESS  2.  Pt will demo understanding of mod complex emails of 10+ sentences with 100% success with compensations, in three sessions Baseline:  Goal status: IN PROGRESS  3.  Pt will write semi-technical 10+ sentence (work-like) messages and/or text messages with 100% success in 3 sessions Baseline:  Goal status: IN PROGRESS  4.  Pt will undergo cognitive linguistic testing if clinically necessary Baseline:  Goal status: DEFERRED   LONG TERM GOALS: Target date: 11/16/22  Pt will have higher/better score on PROM compared to initial reading Baseline:  Goal status: IN PROGRESS  2.  Pt will read 15+ sentence mod complex selections with compensations in 3 sessions Baseline:  Goal status: IN PROGRESS  3.  Pt will write 10+ sentence selections (work like emails, texts, etc)  with compensations in 3 sessions Baseline:  Goal status: IN PROGRESS   ASSESSMENT:  CLINICAL IMPRESSION: Patient is a 57 y.o. male who was seen today for treatment of cognitive linguistics and with reading in light of CVAs in March. See today's "treatment" section for additional details. Pt's OT stated pt demonstrated cognitive deficits such as  processing/mental calculation, and memory. Decided SLP will focus on reading and attention in reading, with OT targeting general cognition. 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    OBJECTIVE IMPAIRMENTS: include memory, expressive language, and aphasia. These impairments are limiting patient from return to work, ADLs/IADLs, and effectively communicating at home and in community. Factors affecting potential to achieve goals and functional outcome are ability to learn/carryover information. Patient will benefit from skilled SLP services to address above impairments and improve overall function.  REHAB POTENTIAL: Good  PLAN:  SLP FREQUENCY: 2x/week  SLP DURATION: 8 weeks  PLANNED INTERVENTIONS: Language facilitation, Environmental controls, Cueing hierachy, Cognitive reorganization, Internal/external aids, Functional tasks,  Multimodal communication approach, SLP instruction and feedback, Compensatory strategies, and Patient/family education    [facility], CCC-SLP 10/05/2022, 9:39 AM

## 2022-10-05 NOTE — Therapy (Signed)
OUTPATIENT OCCUPATIONAL THERAPY NEURO Treatment Note  Patient Name: Luke Jimenez MRN: [id number] DOB:12/22/1965, 57 y.o., male Today's Date: 10/05/2022  PCP: Shon Hale, MD REFERRING PROVIDER: Meredeth Ide, MD  END OF SESSION:  OT End of Session - 10/05/22 1211     Visit Number 5    Number of Visits 13    Date for OT Re-Evaluation 11/05/22    Authorization Type [employer]    OT Start Time 1018    OT Stop Time 1100    OT Time Calculation (min) 42 min                Past Medical History:  Diagnosis Date   Bradycardia    Second degree AV block    Stroke [facility])    Past Surgical History:  Procedure Laterality Date   BUBBLE STUDY  08/13/2022   Procedure: BUBBLE STUDY;  Surgeon: Christell Constant, MD;  Location: [facility] ENDOSCOPY;  Service: Cardiovascular;;   PACEMAKER IMPLANT N/A 12/20/2019   Procedure: PACEMAKER IMPLANT;  Surgeon: Hillis Range, MD;  Location: [facility] INVASIVE CV LAB;  Service: Cardiovascular;  Laterality: N/A;   RIGHT/LEFT HEART CATH AND CORONARY ANGIOGRAPHY N/A 09/28/2022   Procedure: RIGHT/LEFT HEART CATH AND CORONARY ANGIOGRAPHY;  Surgeon: Laurey Morale, MD;  Location: [facility] INVASIVE CV LAB;  Service: Cardiovascular;  Laterality: N/A;   TEE WITHOUT CARDIOVERSION N/A 08/13/2022   Procedure: TRANSESOPHAGEAL ECHOCARDIOGRAM (TEE);  Surgeon: Christell Constant, MD;  Location: [facility] ENDOSCOPY;  Service: Cardiovascular;  Laterality: N/A;   Patient Active Problem List   Diagnosis Date Noted   Heart failure, type unknown (HCC) 09/06/2022   Cardiomyopathy (HCC) 08/13/2022   Cerebrovascular accident (CVA) due to embolism of precerebral artery (HCC) 08/13/2022   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) 08/11/2022   Cryptogenic stroke (HCC) 08/11/2022   Seizure (HCC) 08/10/2022   AMS (altered mental status) 08/09/2022   ATRIOVENTRICULAR BLOCK, 2ND DEGREE 06/17/2010    ONSET DATE: 08/09/22  REFERRING DIAG: I63.10  (ICD-10-CM) - Cerebrovascular accident (CVA) due to embolism of precerebral artery  THERAPY DIAG:  Attention and concentration deficit  Other symptoms and signs involving cognitive functions following cerebral infarctionLama, Sarina Ill, MD  Rationale for Evaluation and Treatment: Rehabilitation  SUBJECTIVE:   SUBJECTIVE STATEMENT: Pt reports fatigue still after heart cath and traveling up to [place] for the weekend. Pt accompanied by: self and significant other (spouse [name])  PERTINENT HISTORY: Pt is a 56 y.o. male who presented 08/09/22 with speech difficulty and AMS. 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 EEG also showing seizures. PMH: second-degree AV block with pacemaker, bradycardia  PRECAUTIONS: None  WEIGHT BEARING RESTRICTIONS: No  PAIN:  Are you having pain? No  FALLS: Has patient fallen in last 6 months? No  LIVING ENVIRONMENT: Lives with: lives with their spouse and family nearby Lives in: House/apartment Stairs: Yes: Internal: bedroom/bathroom up full flight of steps; and External: 4-5 steps Has following equipment at home: Grab bars and built in shower seat, hand held shower head  PLOF: Independent, Independent with basic ADLs, and Vocation/Vocational requirements: Quarry manager   PATIENT GOALS: to improve in reading and short term memory  OBJECTIVE:   HAND DOMINANCE: Right  IADLs: Shopping: has gone to store with spouse, with ability to walk away to retreive an item and return to her  Meal Prep: microwave to warm up leftovers, but did not really cook before Community mobility: not cleared to drive Medication management: spouse  is dispensing  Financial management: spouse takes care of and did before  COGNITION: Overall cognitive status:  Impaired with significant impairments to time orientation, sequencing, attention, and short term memory/recall  VISION: Subjective report: no changes  VISION  ASSESSMENT: To be further assessed in functional context While in hospital, OT noted questionable R inattention, to be further assessed in subsequent visits  Patient has difficulty with following activities due to following visual impairments: reading - unsure if attention, memory, or visual  TODAY'S TREATMENT:                                    10/05/22 Pipe tree puzzle: engaged in 3D pipe tree puzzle replication with focus on alternating attention, sequencing, and problem solving.  Pt making one error but able to correct without cues, utilizing visual pattern.  Discussed functional carryover with allowance of making safe errors for learning. Scavenger hunt: OT provided pt with 5 items to locate in min distracting treatment gym.  Pt only able to recall 1 of 5 items and then able to recall 2 of 4 upon repetition.  Pt able to locate with increased time due to impaired recall.  Discussed use of written lists and organizing lists as able to facilitate increased carryover and sequencing (such as organizing grocery list per layout). Pill box assessment:  Pt completed pill box test in 4:55 minutes, with 12 omission errors.  Pt read 3x/day and administered x3 for only 3 days out of the week.  OT then asked pt to read instruction after time was completed with pt able to read aloud and then asked to identify how many pills total would need to be dispensed.  Pt correctly identifying 21, however only placing 9.  Pt with similar error with 2x/day initially placing 2x/week but able to correct without cues.  Reiterated recommendation to complete meds with spouse with pt reading and interpreting to allow time/space to address errors and wife filling pill box.    09/30/22 Recall: Pt with difficulty with recall of what he had completed during SLP session.  Pt requiring question cues and min assist from spouse with increased recall and details afterwards. Attention: engaged in word search for attention with focus on  aspects of reading with visual scanning and organized processing.  OT incorporating intermittent conversation to challenge increased selective and alternating attention.  Pt with good ability to select attention, but significant difficulty with alternating attention. Engaged in Menu reading to challenge attention to reading and recall with identification of number of items in each category and incorporating mental math when provided with budget when purchasing items from menu.    09/27/22 Pill box assessment: Pt completed in 4:41 with no errors.  Engaged in discussion of "checks and balances" observed that increased pt success as well as how to further increase success.  Pt opening only slots/rows as needed per "prescription" allowing for decreased error.  Discussed organizing meds in progressive order to allow for increased ease/success.  Discussed increasing active participation in meds to decrease burden on caregiver and increase independence. Trail making test: Trail A: 32 sec with no errors and Trail B: 1:32 with one error.  OT educated on functional carryover of alternating attention of Trail B and how to carry over to additional functional tasks.  Encouraged participation in aspects of household tasks to increase processing speed, recall, and sequencing.  Discussed safe failure (within reason) to allow for  education and increased independence.   PATIENT EDUCATION: Education details: ongoing condition specific education and use of pictures or grid to aid in sequencing Person educated: Patient and Spouse Education method: Explanation Education comprehension: verbalized understanding and needs further education  HOME EXERCISE PROGRAM: TBD   GOALS: Goals reviewed with patient? Yes  SHORT TERM GOALS: Target date: 10/08/22  Pt will verbalize understanding of task modifications and/or potential A/E needs to increase ease, safety, and independence w/ ADLs and IADLs. Baseline:  Goal status: IN  PROGRESS  2.  Pt will be able to complete simulated visual scanning activity w/ at least 95% accuracy (finding targets around therapy gym, searching for items on shelves), incorporating visual and memory compensatory strategies prn Baseline:  Goal status: IN PROGRESS  3.  Pt will demonstrate improved memory, sequencing, and problem solving as demonstrated by improvements in score on pill box assessment by ability to complete in 5 mins time limit with </= to 3 errors. Baseline:  Goal status: IN PROGRESS  4.   Pt will complete table top scanning activity with Supervision, min cues for recall of instructions, sequencing during task. Baseline:  Goal status: IN PROGRESS  5.   Pt will be able to demonstrate ability to alternate attention for 5 mins during table top task. Baseline:  Goal status: IN PROGRESS  LONG TERM GOALS: Target date: 11/05/22  Pt will demonstrate ability to sequence simple functional task (simple snack prep, laundry task, etc) at Mod I level with good safety awareness and ability to utilize memory strategies for recall. Baseline:  Goal status: IN PROGRESS  2.  Pt will navigate a moderately busy environment, completing dual task activity and/or following multi-step commands with 90% accuracy Baseline:  Goal status: IN PROGRESS  3.  Pt will complete simulated medication management activity w/ Mod Ind by discharge, incorporating compensatory strategies/AE prn Baseline:  Goal status: IN PROGRESS  4.  Pt will be independent with utilization of memory strategies to allow for increased independence with managing personal appointments. Baseline:  Goal status: IN PROGRESS  5.  Pt will be able to demonstrate ability to divide attention for 5 mins during functional task. Baseline:  Goal status: IN PROGRESS   ASSESSMENT:  CLINICAL IMPRESSION: Reiterated recommendations to be more involved in filling pill box and strategies to increase engagement as able.  Pt completed  pill box assessment this session with significant increase in errors compared to when completed 2 sessions ago.  Reiterated use of checks and balances to decrease risk of medication errors. Encouraged slow increase in challenges and discussing how to upgrade and downgrade tasks to complete just right challenge.  Reiterated use of writing items down for increased recall.  PERFORMANCE DEFICITS: in functional skills including IADLs, endurance, cardiopulmonary status limiting function, decreased knowledge of precautions, and decreased knowledge of use of DME, cognitive skills including attention, memory, orientation, problem solving, and sequencing, and psychosocial skills including coping strategies, environmental adaptation, and routines and behaviors.   IMPAIRMENTS: are limiting patient from IADLs, work, and social participation.   CO-MORBIDITIES: may have co-morbidities  that affects occupational performance. Patient will benefit from skilled OT to address above impairments and improve overall function.  MODIFICATION OR ASSISTANCE TO COMPLETE EVALUATION: Min-Moderate modification of tasks or assist with assess necessary to complete an evaluation.  OT OCCUPATIONAL PROFILE AND HISTORY: Problem focused assessment: Including review of records relating to presenting problem.  CLINICAL DECISION MAKING: Moderate - several treatment options, min-mod task modification necessary  REHAB POTENTIAL: Good  EVALUATION  COMPLEXITY: Moderate    PLAN:  OT FREQUENCY: 1-2x/week  OT DURATION: 8 weeks  PLANNED INTERVENTIONS: self care/ADL training, therapeutic activity, neuromuscular re-education, functional mobility training, patient/family education, cognitive remediation/compensation, visual/perceptual remediation/compensation, psychosocial skills training, energy conservation, and coping strategies training  RECOMMENDED OTHER SERVICES: SLP   CONSULTED AND AGREED WITH PLAN OF CARE: Patient and family  member/caregiver  PLAN FOR NEXT SESSION: alternating attention, educate on memory strategies (checks and balances, checking system)   Kadra Kohan, OTR/L 10/05/2022, 12:12 PM

## 2022-10-06 ENCOUNTER — Ambulatory Visit (HOSPITAL_COMMUNITY)
Admission: RE | Admit: 2022-10-06 | Discharge: 2022-10-06 | Disposition: A | Discharge status: Home / Self Care | Payer: No Typology Code available for payment source | Source: Ambulatory Visit | Attending: Cardiology | Admitting: Cardiology

## 2022-10-06 ENCOUNTER — Encounter (HOSPITAL_COMMUNITY): Payer: Self-pay | Admitting: Cardiology

## 2022-10-06 ENCOUNTER — Telehealth (HOSPITAL_COMMUNITY): Payer: Self-pay | Admitting: *Deleted

## 2022-10-06 VITALS — BP 102/60 | HR 86 | Wt 136.6 lb

## 2022-10-06 DIAGNOSIS — I5020 Unspecified systolic (congestive) heart failure: Secondary | ICD-10-CM

## 2022-10-06 DIAGNOSIS — I5022 Chronic systolic (congestive) heart failure: Secondary | ICD-10-CM | POA: Diagnosis not present

## 2022-10-06 DIAGNOSIS — Z8673 Personal history of transient ischemic attack (TIA), and cerebral infarction without residual deficits: Secondary | ICD-10-CM | POA: Insufficient documentation

## 2022-10-06 DIAGNOSIS — I459 Conduction disorder, unspecified: Secondary | ICD-10-CM | POA: Insufficient documentation

## 2022-10-06 DIAGNOSIS — Z7901 Long term (current) use of anticoagulants: Secondary | ICD-10-CM | POA: Diagnosis not present

## 2022-10-06 DIAGNOSIS — I429 Cardiomyopathy, unspecified: Secondary | ICD-10-CM

## 2022-10-06 DIAGNOSIS — D6851 Activated protein C resistance: Secondary | ICD-10-CM | POA: Insufficient documentation

## 2022-10-06 DIAGNOSIS — I428 Other cardiomyopathies: Secondary | ICD-10-CM | POA: Diagnosis not present

## 2022-10-06 DIAGNOSIS — Z79899 Other long term (current) drug therapy: Secondary | ICD-10-CM | POA: Insufficient documentation

## 2022-10-06 LAB — BASIC METABOLIC PANEL
Anion gap: 8 (ref 5–15)
BUN: 26 mg/dL — ABNORMAL HIGH (ref 6–20)
CO2: 24 mmol/L (ref 22–32)
Calcium: 9.1 mg/dL (ref 8.9–10.3)
Chloride: 105 mmol/L (ref 98–111)
Creatinine, Ser: 1.09 mg/dL (ref 0.61–1.24)
GFR, Estimated: >60 mL/min (ref >60)
Glucose, Bld: 101 mg/dL — ABNORMAL HIGH (ref 70–99)
Potassium: 4.6 mmol/L (ref 3.5–5.1)
Sodium: 137 mmol/L (ref 135–145)

## 2022-10-06 LAB — BRAIN NATRIURETIC PEPTIDE: B Natriuretic Peptide: 146.2 pg/mL — ABNORMAL HIGH (ref 0.0–100.0)

## 2022-10-06 MED ORDER — SPIRONOLACTONE 25 MG PO TABS: 25.0000 mg | ORAL_TABLET | Freq: Every day | ORAL | 3 refills | Status: DC

## 2022-10-06 NOTE — Telephone Encounter (Signed)
Reaching out to patient to offer assistance regarding upcoming cardiac imaging study; pt's wife answered phone with patient and verbalizes understanding of appt date/time, parking situation and where to check in, and verified current allergies; name and call back number provided for further questions should they arise  Larey Brick RN Navigator Cardiac Imaging [facility] [phone] office [phone] cell  Patient denies claustrophobia but does have a device.

## 2022-10-06 NOTE — Progress Notes (Signed)
PCP: Shon Hale, MD HF Cardiology: Dr. Shirlee Latch  57 y.o. 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  Patient developed high grade heart block in 2021 and had a Medtronic PPM with left bundle lead placed.  He is now 99% LB-paced.  Cause of heart block is uncertain.  No history of sarcoidosis or Lyme disease.  CT chest in 3/24 showed no pulmonary findings concerning for sarcoidosis.   Echo in 4/21 showed EF 55-60%.  Patient did well until 3/24 when he had an acute PCA CVA.  Echo in 3/24 showed EF 20-25%, normal RV, mild TR.  TEE showed EF 20-25%, no LV thrombus, and negative bubble study.  He was started on Eliquis due to concern for cardioembolism.   LHC/RHC in 5/24 showed no significant CAD, normal filling pressures, CI 3.32.   Patient has been doing well in general.  Mild dyspnea walking up a steep hill.  No problems walking on flat ground. No lightheadedness.  No chest pain.  No orthopnea/PND.  Weight is stable.   ECG (personally reviewed): NSR, v-paced with QRS 180 msec  Medtronic PPM: 99% left bundle paced  Labs (4/24): K 4.5, creatinine 1.0  PMH: 1. Factor V Leiden heterozygote 2. Seizure disorder 3. High grade heart block: Medtronic PPM with left bundle lead in 2021.  4. Hypothyroidism 5. Hyperlipidemia 6. 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  - Negative bubble study.  7. Chronic systolic CHF: Echo in 4/21 with EF 55-60%.  Echo (3/24) with EF 20-25%, normal RV, mild TR. - TEE (3/24): EF 20-25%, no LV thrombus, negative bubble study.  - LHC/RHC (5/24): No significant CAD; mean RA 1, PA 22/4, mean PCWP 11, CI 3.32  FH: No cardiomyopathy or sarcoidosis.  Parkinsons in father and brother.   SH: Nonsmoker, rare ETOH, married, Quarry manager for [employer].   ROS: All systems reviewed and negative except as per HPI.   Current Outpatient  Medications  Medication Sig Dispense Refill   apixaban (ELIQUIS) 5 MG TABS tablet Take 1 tablet (5 mg total) by mouth 2 (two) times daily. 60 tablet 3   Ascorbic Acid (VITAMIN C) 1000 MG tablet Take 1,000 mg by mouth 2 (two) times daily at 10 AM and 5 PM.     carvedilol (COREG) 3.125 MG tablet Take 1 tablet (3.125 mg total) by mouth 2 (two) times daily with a meal. HOLD for SBP less than 90 60 tablet 2   cetirizine (ZYRTEC) 10 MG tablet Take 10 mg by mouth daily as needed for allergies.     dapagliflozin propanediol (FARXIGA) 10 MG TABS tablet Take 1 tablet (10 mg total) by mouth daily before breakfast. 30 tablet 11   lacosamide (VIMPAT) 200 MG TABS tablet Take 1 tablet (200 mg total) by mouth 2 (two) times daily. 60 tablet 2   levETIRAcetam (KEPPRA) 750 MG tablet Take 2 tablets (1,500 mg total) by mouth 2 (two) times daily. 120 tablet 2   sacubitril-valsartan (ENTRESTO) 24-26 MG Take 1 tablet by mouth 2 (two) times daily. 60 tablet 11   thyroid (ARMOUR) 60 MG tablet Take 60 mg by mouth daily before breakfast.     spironolactone (ALDACTONE) 25 MG tablet Take 1 tablet (25 mg total) by mouth daily. 90 tablet 3   No current facility-administered medications for this encounter.   BP 102/60   Pulse 86   Wt  62 kg (136 lb 9.6 oz)   SpO2 100%   BMI 19.60 kg/m  General: NAD Neck: No JVD, no thyromegaly or thyroid nodule.  Lungs: Clear to auscultation bilaterally with normal respiratory effort. CV: Nondisplaced PMI.  Heart regular S1/S2, no S3/S4, no murmur.  No peripheral edema.  No carotid bruit.  Normal pedal pulses.  Abdomen: Soft, nontender, no hepatosplenomegaly, no distention.  Skin: Intact without lesions or rashes.  Neurologic: Alert and oriented x 3.  Psych: Normal affect. Extremities: No clubbing or cyanosis.  HEENT: Normal.   Assessment/Plan: 1. High grade heart block: Patient has MDT PPM with left bundle lead, he is pacer dependent (>99% LB paced).  Cause of heart block is  uncertain.  Not known to have sarcoidosis or to have had Lyme disease.  CTA chest in 3/24 was not suggestive of pulmonary sarcoidosis. Paced QRS is quite wide at 180 msec, suspect significant dyssynchrony despite left bundle lead.  - He has seen Dr. Nelly Laurence, I think that he will need left bundle lead extracted and CS lead placed for BiV pacing.  2. CVA: Acute PCA CVA in 3/24 with some residual difficulty with memory and reading. 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  No atrial fibrillation detected and bubble study negative. Of note, he is a Factor V Leiden heterozygote.  - Continue Eliquis due to concern for cardioembolic CVA from occult LV thrombus.  3. Chronic systolic CHF: Nonischemic cardiomyopathy.  Echo in 3/24 with EF 20-25%, normal RV, mild TR.  LHC/RHC in 5/24 showed no significant coronary disease, normal filling pressures, preserved cardiac output.  Cardiomyopathy may have been triggered by long-term pacemaker-mediated dyssychrony (has LB lead but paced QRS is quite wide at 180 msec). Would investigate for cardiac sarcoidosis given heart block of uncertain etiology.  He is not volume overloaded on exam, NYHA class I-II.   - Continue Coreg 3.125 mg bid.  - Continue dapagliflozin 10 mg daily - Increase spironolactone to 25 mg daily.  BMET/BNP today and BMET in 10 days.  - Continue Entresto 24/26 bid.  - He needs cardiac MRI to assess for evidence for cardiac sarcoidosis.  His device should be MRI compatible.  - I think that he is going to need extraction of left bundle lead with placement of CS lead for CRT given very wide QRS.  He will have his cardiac MRI tomorrow; if there is significant delayed enhancement noted, best strategy may be CS lead + ICD upgrade.  If no delayed enhancement, EF may improve significantly with CRT and could potentially proceed with CS lead and no ICD upgrade, assessing EF down the road for recovery with the potential to upgrade to ICD in the future if needed.    Followup with HF pharmacist in 3 wks for med titration.  See me in 6 wks.   Marca Ancona 10/06/2022

## 2022-10-06 NOTE — Patient Instructions (Signed)
INCREASE Spironolactone to 25 mg daily.  Labs done today, your results will be available in MyChart, we will contact you for abnormal readings.  Repeat blood work in 10 days.  Please follow up with our heart failure pharmacist as scheduled.  Your physician recommends that you schedule a follow-up appointment in: 2 months.  If you have any questions or concerns before your next appointment please send [place] a message through [place] or call our office at [phone].    TO LEAVE A MESSAGE FOR THE NURSE SELECT OPTION 2, PLEASE LEAVE A MESSAGE INCLUDING: YOUR NAME DATE OF BIRTH CALL BACK NUMBER REASON FOR CALL**this is important as we prioritize the call backs  YOU WILL RECEIVE A CALL BACK THE SAME DAY AS LONG AS YOU CALL BEFORE 4:00 PM  At the [facility], you and your health needs are our priority. As part of our continuing mission to provide you with exceptional heart care, we have created designated Provider Care Teams. These Care Teams include your primary Cardiologist (physician) and Advanced Practice Providers (APPs- Physician Assistants and Nurse Practitioners) who all work together to provide you with the care you need, when you need it.   You may see any of the following providers on your designated Care Team at your next follow up: Dr Arvilla Meres Dr Marca Ancona Dr. Marcos Eke, NP Robbie Lis, [place] [facility] [place], [place] Brynda Peon, NP Karle Plumber, PharmD   Please be sure to bring in all your medications bottles to every appointment.    Thank you for choosing [facility]

## 2022-10-07 ENCOUNTER — Ambulatory Visit (HOSPITAL_COMMUNITY)
Admission: RE | Admit: 2022-10-07 | Discharge: 2022-10-07 | Disposition: A | Discharge status: Home / Self Care | Payer: No Typology Code available for payment source | Source: Ambulatory Visit | Attending: Cardiology | Admitting: Cardiology

## 2022-10-07 ENCOUNTER — Other Ambulatory Visit (HOSPITAL_COMMUNITY): Payer: Self-pay | Admitting: Cardiology

## 2022-10-07 ENCOUNTER — Ambulatory Visit: Payer: No Typology Code available for payment source

## 2022-10-07 ENCOUNTER — Ambulatory Visit: Payer: No Typology Code available for payment source | Admitting: Occupational Therapy

## 2022-10-07 ENCOUNTER — Telehealth (HOSPITAL_COMMUNITY): Payer: Self-pay | Admitting: Cardiology

## 2022-10-07 DIAGNOSIS — R480 Dyslexia and alexia: Secondary | ICD-10-CM

## 2022-10-07 DIAGNOSIS — I5020 Unspecified systolic (congestive) heart failure: Secondary | ICD-10-CM

## 2022-10-07 DIAGNOSIS — R4701 Aphasia: Secondary | ICD-10-CM

## 2022-10-07 DIAGNOSIS — I69318 Other symptoms and signs involving cognitive functions following cerebral infarction: Secondary | ICD-10-CM

## 2022-10-07 DIAGNOSIS — R41841 Cognitive communication deficit: Secondary | ICD-10-CM

## 2022-10-07 DIAGNOSIS — R41840 Attention and concentration deficit: Secondary | ICD-10-CM

## 2022-10-07 MED ORDER — GADOBUTROL 1 MMOL/ML IV SOLN
10.0000 mL | Freq: Once | INTRAVENOUS | Status: AC | PRN
  Administered 2022-10-07: 10 mL via INTRAVENOUS

## 2022-10-07 NOTE — Telephone Encounter (Signed)
No pre cert required per call ref# [id number]

## 2022-10-07 NOTE — Progress Notes (Signed)
Medtronic device rep, Del here and programmed device per order,  "Change device settings to DOO 110 bpm MRI Sure scan.   Tachy therapies off if applicable.   Program back to regular settings after scan and send post transmission."

## 2022-10-07 NOTE — Therapy (Signed)
OUTPATIENT SPEECH LANGUAGE PATHOLOGY TREATMENT   Patient Name: Luke Jimenez MRN: [id number] DOB:Feb 23, 1966, 57 y.o., male Today's Date: 10/07/2022  PCP: Shon Hale MD (Documentation) REFERRING PROVIDER: Meredeth Ide, MD  END OF SESSION:  End of Session - 10/07/22 0936     Visit Number 7    Number of Visits 17    Date for SLP Re-Evaluation 11/17/22    SLP Start Time 0934    SLP Stop Time  1015    SLP Time Calculation (min) 41 min    Activity Tolerance Patient tolerated treatment well               Past Medical History:  Diagnosis Date   Bradycardia    Second degree AV block    Stroke [facility])    Past Surgical History:  Procedure Laterality Date   BUBBLE STUDY  08/13/2022   Procedure: BUBBLE STUDY;  Surgeon: Christell Constant, MD;  Location: [facility] ENDOSCOPY;  Service: Cardiovascular;;   PACEMAKER IMPLANT N/A 12/20/2019   Procedure: PACEMAKER IMPLANT;  Surgeon: Hillis Range, MD;  Location: [facility] INVASIVE CV LAB;  Service: Cardiovascular;  Laterality: N/A;   RIGHT/LEFT HEART CATH AND CORONARY ANGIOGRAPHY N/A 09/28/2022   Procedure: RIGHT/LEFT HEART CATH AND CORONARY ANGIOGRAPHY;  Surgeon: Laurey Morale, MD;  Location: 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) 08/11/2022   Cryptogenic stroke (HCC) 08/11/2022   Seizure (HCC) 08/10/2022   AMS (altered mental status) 08/09/2022   ATRIOVENTRICULAR BLOCK, 2ND DEGREE 06/17/2010    ONSET DATE: 08/09/22   REFERRING  DIAG:  I63.10 (ICD-10-CM) - Cerebrovascular accident (CVA) due to embolism of precerebral artery    THERAPY DIAG:  Aphasia  Cognitive communication deficit  Dyslexia and alexia  Rationale for Evaluation and Treatment: Rehabilitation  SUBJECTIVE:   SUBJECTIVE STATEMENT: "I have never been a great speller." Pt accompanied by: significant other wife [name]  PERTINENT HISTORY: Pt is a 57 y.o. male who presented to ED with complaints of nonspecific complaints of feeling off Pt family concerned that he was having difficulty with speaking over the phone. Also was having difficulty finding words with forgetfulness. Pt had slight headache yesterday but no other symptoms. CT head (08/09/22) showed hypodensity in the left temporal lobe suspicious for acute infarct. MRI brain (08/11/22) revealed "Acute left PCA territory infarcts". EEG (08/12/22) "suggestive of cortical dysfunction arising from left temporal region likely secondary to underlying stroke, post-ictal state. No further seizures were noted". PMH: second-degree AV block with pacemaker  PAIN:  Are you having pain? No  FALLS: Has patient fallen in last 6 months?  No  PATIENT GOALS: Improve reading to be able to return to work  OBJECTIVE:   DIAGNOSTIC FINDINGS:  SLE 08/12/22: Assessment / Plan / Recommendation Clinical Impression     Pt presents with primarily cognitive communication deficits, with very few word finding difficulites and likely visual recognition deficits. He is oriented fully to self and place with difficulty recalling year (1994) and reason for hospitalization. Immediate recall of listed items and perfomance with simple mathematical  calculation were [facility]. Cognitive deificts noted were in the areas of delayed recall (2/5 items recalled), complex verbal problem solving and executive function (inferencing, self-monitoring/correcting). During informal conversation, no word finding or expressive language difficulties noted.  Perseveration/hesistation exhibited during confrontational naming task in x1 instance. Receptive language appeared [facility] for tasks provided. During reading (words/sentences) and picture scene task, pt with difficulty recognizing/discriminating few pictured objects/words. Some hesitations evident during reading task and pt reports no issues with visual acuity, but with some delay in recognizing word. Would need to r/o any other visual deficits to determine if solely a recognition/discrimination issue. Recommend SLP services f/u acutely to treat primarily for cognitive communication function. Ongoing assessment/ dx treatment for language function also warranted. Wife present in room/via phone for majority of session is in agreement with recommendations.       PATIENT REPORTED OUTCOME MEASURES (PROM): To be completed week of 10/11/22.   TODAY'S TREATMENT:                                                                                                                                         DATE:  10/07/22: SLP to provide pt with PROM next session. SLP worked with pt reading using his memory strategy handout rec'd in OT. Pt with hesitation with multisyllable words and SLP had pt implement technique discussed last session. Pt and wife had not had time to work on reading due to family plans the previous two days. Pt made errors reading on functor words and req'd SLP cues for awareness.   10/04/21: SLP suggested pt and wife do more reading homework with the suggestions by SLP implemented (see pt instructions). Pt and wife will complete reading homework together for 25-30 minutes, x5 days/week.  SLP asked pt to think about how these deficits would impact him at work and pt stated he would need to write more notes for recall than he did prior to CVA. Pt does not know how reading deficit will affect him at work due to [employer] being another language, with other context cues.  09/30/22: Further targeted and analyzed  reading today. Pt endorsed some reduced word recognition, particularly with consonant clusters and multisyllabic words. Targeted oral reading of Rainbow passage. SLP noted intermittent delays with more common sight words (ex: white, beautiful) as well as possible phonemic paraphasias (ex: when/then, universal/unusual, bright/bridge). Only self-corrected error x1. Encouraged metacognitive analysis of reading ability, in which pt identified not attending to entire word resulting in errors. Recommended visual supports (finger, visual marker) to aid attention and processing speed. Targeted minimal pairs, in which pt required some extended time and occasional mod A to correct errors. Recommended functional reading at home to aid word recognition.   09/27/22: Addressed reading with use of compensations to aid in attention. Pt benefiting from oral reading, aiding in recall of x7 pertinent details from paragraph level stimuli, in spite of occasional disruption in  reading fluency. Provided education on use of text to speech settings on [employer] products to aid in reading practice. Recommend reading with support, then returning and re-reading IND to aid in increased fluency. Pt to trial reading aloud to optimize attention and recall of read material.   09/23/22: Pt and wife and SLP talked about pt's cognitive deficits - pt and wife agreed pt's overall processing is slower than before CVA. SLP could initiate (or complete) CLQT with pt next session. [place] also endorses some deficits in higher level attention ("I just have to do one thing and then do the other thing - I can't do them together anymore."). With extra time allowed, pt showed good (not excellent) insight what he might need to tell his supervisor if he were told to return to work this week. During this discussion pt indicated he was more tired now than pre-CVA. 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 SLP continued the Reading Comprehenstion Battery of Aphasia - 2nd Ed. (RCBA-2); SLP-Katherinne Mofield to complete the last subtest the week of 10-04-22.  09/14/22: SLP initiated Reading Comprehenstion Battery of Aphasia - 2nd Ed. (RCBA-2). 8/10 subtests completed.   Pt with incr'd time necessary for all subtests. Single words - choice of f:3 words to match to pic took approx 13 seconds per picture. SLP to cont assessment next session. SLP told pt to continue to read at home - pt states he is getting good practice reading text messages.   09/08/22: Suggested home tasks for pt and wife  PATIENT EDUCATION: Education details: Results of eval, therapy course, home tasks Person educated: Patient and Spouse Education method: Explanation, Demonstration, and Verbal cues Education comprehension: verbalized understanding, returned demonstration, verbal cues required, and needs further education   GOALS: Goals reviewed with patient? No  SHORT TERM GOALS: Target date: 10/14/22  Pt will read 8-10 word sentence selections and indicate correct answers to written or spoken questions 100% success with extra time in 3 sessions Baseline: Goal status: IN PROGRESS  2.  Pt will demo understanding of mod complex emails of 10+ sentences with 100% success with compensations, in three sessions Baseline:  Goal status: IN PROGRESS  3.  Pt will write semi-technical 10+ sentence (work-like) messages and/or text messages with 100% success in 3 sessions Baseline:  Goal status: IN PROGRESS  4.  Pt will undergo cognitive linguistic testing if clinically necessary Baseline:  Goal status: DEFERRED   LONG TERM GOALS: Target date: 11/16/22  Pt will have higher/better score on PROM compared to initial reading Baseline:  Goal status: IN PROGRESS  2.  Pt will read 15+ sentence mod complex selections with compensations in 3 sessions Baseline:  Goal status: IN PROGRESS  3.  Pt will write 10+ sentence selections (work  like emails, texts, etc)  with compensations in 3 sessions Baseline:  Goal status: IN PROGRESS   ASSESSMENT:  CLINICAL IMPRESSION: Patient is a 57 y.o. male who was seen today for treatment of cognitive linguistics and with reading in light of CVAs in March. See today's "treatment" section for additional details. Pt's OT stated pt demonstrated cognitive deficits such as processing/mental calculation, and memory. Decided SLP will focus on reading and attention in reading, with OT targeting general cognition. 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    OBJECTIVE IMPAIRMENTS: include memory, expressive language, and aphasia. These impairments are limiting patient from return  to work, ADLs/IADLs, and effectively communicating at home and in community. Factors affecting potential to achieve goals and functional outcome are ability to learn/carryover information. Patient will benefit from skilled SLP services to address above impairments and improve overall function.  REHAB POTENTIAL: Good  PLAN:  SLP FREQUENCY: 2x/week  SLP DURATION: 8 weeks  PLANNED INTERVENTIONS: Language facilitation, Environmental controls, Cueing hierachy, Cognitive reorganization, Internal/external aids, Functional tasks, Multimodal communication approach, SLP instruction and feedback, Compensatory strategies, and Patient/family education    [facility], CCC-SLP 10/07/2022, 9:38 AM

## 2022-10-07 NOTE — Patient Instructions (Signed)

## 2022-10-07 NOTE — Therapy (Signed)
OUTPATIENT OCCUPATIONAL THERAPY NEURO Treatment Note  Patient Name: Luke Jimenez MRN: [id number] DOB:1966-04-05, 57 y.o., male Today's Date: 10/07/2022  PCP: Shon Hale, MD REFERRING PROVIDER: Meredeth Ide, MD  END OF SESSION:  OT End of Session - 10/07/22 0850     Visit Number 6    Number of Visits 13    Date for OT Re-Evaluation 11/05/22    Authorization Type [employer]/[place] Preferred    OT Start Time 0846    OT Stop Time 0930    OT Time Calculation (min) 44 min                 Past Medical History:  Diagnosis Date   Bradycardia    Second degree AV block    Stroke [facility])    Past Surgical History:  Procedure Laterality Date   BUBBLE STUDY  08/13/2022   Procedure: BUBBLE STUDY;  Surgeon: Christell Constant, MD;  Location: [facility] ENDOSCOPY;  Service: Cardiovascular;;   PACEMAKER IMPLANT N/A 12/20/2019   Procedure: PACEMAKER IMPLANT;  Surgeon: Hillis Range, MD;  Location: [facility] INVASIVE CV LAB;  Service: Cardiovascular;  Laterality: N/A;   RIGHT/LEFT HEART CATH AND CORONARY ANGIOGRAPHY N/A 09/28/2022   Procedure: RIGHT/LEFT HEART CATH AND CORONARY ANGIOGRAPHY;  Surgeon: Laurey Morale, MD;  Location: [facility] INVASIVE CV LAB;  Service: Cardiovascular;  Laterality: N/A;   TEE WITHOUT CARDIOVERSION N/A 08/13/2022   Procedure: TRANSESOPHAGEAL ECHOCARDIOGRAM (TEE);  Surgeon: Christell Constant, MD;  Location: 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) 08/11/2022   Cryptogenic stroke (HCC) 08/11/2022   Seizure (HCC) 08/10/2022   AMS (altered mental status) 08/09/2022   ATRIOVENTRICULAR BLOCK, 2ND DEGREE 06/17/2010    ONSET DATE: 08/09/22  REFERRING DIAG: I63.10  (ICD-10-CM) - Cerebrovascular accident (CVA) due to embolism of precerebral artery  THERAPY DIAG:  Attention and concentration deficit  Other symptoms and signs involving cognitive functions following cerebral infarctionLama, Sarina Ill, MD  Rationale for Evaluation and Treatment: Rehabilitation  SUBJECTIVE:   SUBJECTIVE STATEMENT: Pt reports being a little more groggy in the mornings. Pt accompanied by: self and significant other (spouse [name])  PERTINENT HISTORY: Pt is a 57 y.o. male who presented 08/09/22 with speech difficulty and AMS. 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 EEG also showing seizures. PMH: second-degree AV block with pacemaker, bradycardia  PRECAUTIONS: None  WEIGHT BEARING RESTRICTIONS: No  PAIN:  Are you having pain? No  FALLS: Has patient fallen in last 6 months? No  LIVING ENVIRONMENT: Lives with: lives with their spouse and family nearby Lives in: House/apartment Stairs: Yes: Internal: bedroom/bathroom up full flight of steps; and External: 4-5 steps Has following equipment at home: Grab bars and built in shower seat, hand held shower head  PLOF: Independent, Independent with basic ADLs, and Vocation/Vocational requirements: Quarry manager   PATIENT GOALS: to improve in reading and short term memory  OBJECTIVE:   HAND DOMINANCE: Right  IADLs: Shopping: has gone to store with spouse, with ability to walk away to retreive an item and return to her  Meal Prep: microwave to warm up leftovers, but did not really cook before Community mobility: not cleared to drive Medication management: spouse is Designer, television/film set  management: spouse takes care of and did before  COGNITION: Overall cognitive status:  Impaired with significant impairments to time orientation, sequencing, attention, and short term memory/recall  VISION: Subjective report: no changes  VISION ASSESSMENT: To be further  assessed in functional context While in hospital, OT noted questionable R inattention, to be further assessed in subsequent visits  Patient has difficulty with following activities due to following visual impairments: reading - unsure if attention, memory, or visual  TODAY'S TREATMENT:                                    10/07/22 Memory: Pt reports keeping sticky notes with information in regards to where to leave off/return to on work projects and would utilize work calendar for reminders of appts and projects.  Discussed recommendation to come up with additional and/or modified strategies to improve ease and success with recall in regards to medications and appointments.  Recommended use of written and/or digital calendar.  Encouraged use of shared calendar as wife currently keeping up with all MD appts due to providing transportation and working appts around her work schedule. Reviewed memory strategy handout and encouraged finding at least 2 strategies to initiate and tweak as needed. Organizing: reviewed utilizing systems to increase recall and sequencing.  Completed simulated grocery organization task. Pt utilizing symbols to organize grocery list.  Requiring 4 mins to complete.  Discussed use of grocery apps to aid in recall and organization.    10/05/22 Pipe tree puzzle: engaged in 3D pipe tree puzzle replication with focus on alternating attention, sequencing, and problem solving.  Pt making one error but able to correct without cues, utilizing visual pattern.  Discussed functional carryover with allowance of making safe errors for learning. Scavenger hunt: OT provided pt with 5 items to locate in min distracting treatment gym.  Pt only able to recall 1 of 5 items and then able to recall 2 of 4 upon repetition.  Pt able to locate with increased time due to impaired recall.  Discussed use of written lists and organizing lists as able to facilitate increased carryover and sequencing (such as  organizing grocery list per layout). Pill box assessment:  Pt completed pill box test in 4:55 minutes, with 12 omission errors.  Pt read 3x/day and administered x3 for only 3 days out of the week.  OT then asked pt to read instruction after time was completed with pt able to read aloud and then asked to identify how many pills total would need to be dispensed.  Pt correctly identifying 21, however only placing 9.  Pt with similar error with 2x/day initially placing 2x/week but able to correct without cues.  Reiterated recommendation to complete meds with spouse with pt reading and interpreting to allow time/space to address errors and wife filling pill box.    09/30/22 Recall: Pt with difficulty with recall of what he had completed during SLP session.  Pt requiring question cues and min assist from spouse with increased recall and details afterwards. Attention: engaged in word search for attention with focus on aspects of reading with visual scanning and organized processing.  OT incorporating intermittent conversation to challenge increased selective and alternating attention.  Pt with good ability to select attention, but significant difficulty with alternating attention. Engaged in Menu reading to challenge attention to reading and recall with identification of number of items in each category and incorporating mental math when  provided with budget when purchasing items from menu.   PATIENT EDUCATION: Education details: ongoing condition specific education and use of pictures or grid to aid in sequencing Person educated: Patient and Spouse Education method: Explanation Education comprehension: verbalized understanding and needs further education  HOME EXERCISE PROGRAM: TBD   GOALS: Goals reviewed with patient? Yes  SHORT TERM GOALS: Target date: 10/08/22  Pt will verbalize understanding of task modifications and/or potential A/E needs to increase ease, safety, and independence w/ ADLs and  IADLs. Baseline:  Goal status: MET - 10/07/22  2.  Pt will be able to complete simulated visual scanning activity w/ at least 95% accuracy (finding targets around therapy gym, searching for items on shelves), incorporating visual and memory compensatory strategies prn Baseline:  Goal status: IN PROGRESS  3.  Pt will demonstrate improved memory, sequencing, and problem solving as demonstrated by improvements in score on pill box assessment by ability to complete in 5 mins time limit with </= to 3 errors. Baseline:  Goal status: IN PROGRESS  4.   Pt will complete table top scanning activity with Supervision, min cues for recall of instructions, sequencing during task. Baseline:  Goal status: MET - 10/05/22  5.   Pt will be able to demonstrate ability to alternate attention for 5 mins during table top task. Baseline:  Goal status: NOT MET - 10/07/22  LONG TERM GOALS: Target date: 11/05/22  Pt will demonstrate ability to sequence simple functional task (simple snack prep, laundry task, etc) at Mod I level with good safety awareness and ability to utilize memory strategies for recall. Baseline:  Goal status: IN PROGRESS  2.  Pt will navigate a moderately busy environment, completing dual task activity and/or following multi-step commands with 90% accuracy Baseline:  Goal status: IN PROGRESS  3.  Pt will complete simulated medication management activity w/ Mod Ind by discharge, incorporating compensatory strategies/AE prn Baseline:  Goal status: IN PROGRESS  4.  Pt will be independent with utilization of memory strategies to allow for increased independence with managing personal appointments. Baseline:  Goal status: IN PROGRESS  5.  Pt will be able to demonstrate ability to divide attention for 5 mins during functional task. Baseline:  Goal status: IN PROGRESS   ASSESSMENT:  CLINICAL IMPRESSION: Reviewed goals and progress towards goals with pt reporting that he would typically  leave himself notes and utilized work calendar for scheduling and reminders.  Encouraged pt and spouse to utilize shared digital calendar to allow for pt to begin to keep up with appts vs always asking her.  Pt requiring increased time with sequencing activity, however able to complete with use of "checks and balances" and reports possibly completing in a different manner in the future to increase success.  Pt is utilizing some memory strategies and was prior to stroke, however would benefit from more active engagement in tasks to facilitate increased need for recall.  PERFORMANCE DEFICITS: in functional skills including IADLs, endurance, cardiopulmonary status limiting function, decreased knowledge of precautions, and decreased knowledge of use of DME, cognitive skills including attention, memory, orientation, problem solving, and sequencing, and psychosocial skills including coping strategies, environmental adaptation, and routines and behaviors.   IMPAIRMENTS: are limiting patient from IADLs, work, and social participation.   CO-MORBIDITIES: may have co-morbidities  that affects occupational performance. Patient will benefit from skilled OT to address above impairments and improve overall function.  MODIFICATION OR ASSISTANCE TO COMPLETE EVALUATION: Min-Moderate modification of tasks or assist with assess necessary to complete  an evaluation.  OT OCCUPATIONAL PROFILE AND HISTORY: Problem focused assessment: Including review of records relating to presenting problem.  CLINICAL DECISION MAKING: Moderate - several treatment options, min-mod task modification necessary  REHAB POTENTIAL: Good  EVALUATION COMPLEXITY: Moderate    PLAN:  OT FREQUENCY: 1-2x/week  OT DURATION: 8 weeks  PLANNED INTERVENTIONS: self care/ADL training, therapeutic activity, neuromuscular re-education, functional mobility training, patient/family education, cognitive remediation/compensation, visual/perceptual  remediation/compensation, psychosocial skills training, energy conservation, and coping strategies training  RECOMMENDED OTHER SERVICES: SLP   CONSULTED AND AGREED WITH PLAN OF CARE: Patient and family member/caregiver  PLAN FOR NEXT SESSION: alternating attention, educate on memory strategies (checks and balances, checking system).  Complete organization of day activity and scavenger hunt with use of memory strategies.   Rosalio Loud, OTR/L 10/07/2022, 8:51 AM

## 2022-10-08 ENCOUNTER — Telehealth (HOSPITAL_COMMUNITY): Payer: Self-pay

## 2022-10-08 NOTE — Telephone Encounter (Addendum)
Pt aware, agreeable, and verbalized understanding   ----- Message from Laurey Morale, MD sent at 10/08/2022  7:28 AM EDT ----- Difficult study due to artifact from pacemaker.  EF remains low and there is significant dyssynchrony due to pacing.  There is a small amount of scar in the inferior wall, possibly prior myocarditis, less suggestive of sarcoidosis. I think he needs pacemaker upgrade as we have discussed.

## 2022-10-12 ENCOUNTER — Encounter: Payer: Self-pay | Admitting: Cardiovascular Disease

## 2022-10-12 ENCOUNTER — Ambulatory Visit
Payer: No Typology Code available for payment source | Attending: Cardiovascular Disease | Admitting: Cardiovascular Disease

## 2022-10-12 VITALS — BP 88/60 | HR 102 | Ht 70.0 in | Wt 136.8 lb

## 2022-10-12 DIAGNOSIS — I509 Heart failure, unspecified: Secondary | ICD-10-CM

## 2022-10-12 DIAGNOSIS — I442 Atrioventricular block, complete: Secondary | ICD-10-CM | POA: Diagnosis not present

## 2022-10-12 DIAGNOSIS — I420 Dilated cardiomyopathy: Secondary | ICD-10-CM

## 2022-10-12 NOTE — H&P (View-Only) (Signed)
Electrophysiology Office Note:    Date:  10/12/2022   ID:  Luke Jimenez, DOB 11/26/1965, MRN [id number]  PCP:  Timberlake, Kathryn S, MD   [facility] Providers Cardiologist:  Mark Skains, MD Electrophysiologist:  James Allred, MD (Inactive)     Referring MD: Timberlake, Kathryn S, *   History of Present Illness:    Luke Jimenez is a 57 y.o. male with a hx listed below, significant for bradycardia with pacemaker in place, referred for arrhythmia management.  He was found to have 2-1 AV block and paroxysmal pauses of greater than 4 seconds prior to a pacemaker placement in August 2021 by Dr. Allred.  A left bundle position lead was placed at that time.  He was admitted in March 2024 with vague symptoms concerning for stroke.  During the admission he was found to have an EF of 20 to 25%.  Coreg 3.125 mg twice daily was started prior to discharge.  He was started on dapagliflozin 10, spironolactone 25 yesterday.  Since his last visit, he had a CMR that showed EF < 15% with minimal LGE. This indicated a decline in EF despite having been on dapaglifozin, spironolactone, carvedilol, and entresto.  Past Medical History:  Diagnosis Date   Bradycardia    Second degree AV block    Stroke (HCC)     Past Surgical History:  Procedure Laterality Date   BUBBLE STUDY  08/13/2022   Procedure: BUBBLE STUDY;  Surgeon: Chandrasekhar, Mahesh A, MD;  Location: [facility] ENDOSCOPY;  Service: Cardiovascular;;   PACEMAKER IMPLANT N/A 12/20/2019   Procedure: PACEMAKER IMPLANT;  Surgeon: Allred, James, MD;  Location: [facility] INVASIVE CV LAB;  Service: Cardiovascular;  Laterality: N/A;   RIGHT/LEFT HEART CATH AND CORONARY ANGIOGRAPHY N/A 09/28/2022   Procedure: RIGHT/LEFT HEART CATH AND CORONARY ANGIOGRAPHY;  Surgeon: McLean, Dalton S, MD;  Location: [facility] INVASIVE CV LAB;  Service: Cardiovascular;  Laterality: N/A;   TEE WITHOUT CARDIOVERSION N/A 08/13/2022   Procedure: TRANSESOPHAGEAL ECHOCARDIOGRAM (TEE);  Surgeon:  Chandrasekhar, Mahesh A, MD;  Location: [facility] ENDOSCOPY;  Service: Cardiovascular;  Laterality: N/A;    Current Medications: No outpatient medications have been marked as taking for the 10/12/22 encounter (Office Visit) with Keina Mutch E, MD.     Allergies:   Patient has no known allergies.   Social and Family History: Reviewed in Epic  ROS:   Please see the history of present illness.    All other systems reviewed and are negative.  EKGs/Labs/Other Studies Reviewed Today:    Echocardiogram:  08/12/22 EF 20-25% with global hypokinesis   Monitors:   Stress testing:   Advanced imaging:  CMR 10/07/2022     EF 14% with prominent septal-lateral dyssynchrony  EKG:  Last EKG results: yesterday - reviewed by me -- A-sensed, V-paced   Recent Labs: 08/09/2022: TSH 0.063 08/10/2022: ALT 11 08/13/2022: Magnesium 2.0 09/28/2022: Hemoglobin 16.0; Hemoglobin 15.6; Platelets 218 10/06/2022: B Natriuretic Peptide 146.2; BUN 26; Creatinine, Ser 1.09; Potassium 4.6; Sodium 137     Physical Exam:    VS:  BP (!) 88/60   Pulse (!) 102   Ht 5' 10" (1.778 m)   Wt 136 lb 12.8 oz (62.1 kg)   SpO2 98%   BMI 19.63 kg/m     Wt Readings from Last 3 Encounters:  10/12/22 136 lb 12.8 oz (62.1 kg)  10/06/22 136 lb 9.6 oz (62 kg)  09/28/22 144 lb (65.3 kg)     GEN: Well nourished, well developed in no acute distress   CARDIAC: RRR, no murmurs, rubs, gallops The device site is normal -- no tenderness, edema, drainage, redness, threatened erosion. RESPIRATORY:  Normal work of breathing MUSCULOSKELETAL: no edema    ASSESSMENT & PLAN:    Symptomatic second degree AV block Normal device function LBB lead - LV activation is not optimal; QRS is fairly wide but LVAT appears ok. Risk of pacing-induced cardiomyopathy is not as clear as with RV apical pacing. He is device dependent  Medtronic dual chamber pacemaker RV lead is in the LBB position  CHFrEF Diagnosis at the time of an  admission for stroke EF continues to decline despite titration of GDMT - I do not think the procedure should be delayed; EF is not expected to improve -- much less improve beyond 35%. 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 We discussed options. I have hope that, because there is low LGE on CMR, he may have recovery of EF with re-synchronization. Will plan to upgrade his existing device with additional of an LV lead.  I discussed the indication for the procedure and the logistics, risks, potential benefit, and after care. I specifically explained that risks include but are not limited to infection, bleeding,damage to blood vessels, lung, and the heart -- but risk of prolonged hospitalization, need for surgery, or the event of stroke, heart attack, or death are low but not zero.          I will see again in about a month.  Medication Adjustments/Labs and Tests Ordered: Current medicines are reviewed at length with the patient today.  Concerns regarding medicines are outlined above.  No orders of the defined types were placed in this encounter.  No orders of the defined types were placed in this encounter.    Signed, Arian Mcquitty E Marcela Alatorre, MD  10/12/2022 3:58 PM    [facility] 

## 2022-10-12 NOTE — Progress Notes (Signed)
Electrophysiology Office Note:    Date:  10/12/2022   ID:  Luke Jimenez, DOB 18-Aug-1965, MRN [id number]  PCP:  Shon Hale, MD   [facility] Providers Cardiologist:  Donato Schultz, MD Electrophysiologist:  Hillis Range, MD (Inactive)     Referring MD: Shon Hale, *   History of Present Illness:    Luke Jimenez is a 57 y.o. male with a hx listed below, significant for bradycardia with pacemaker in place, referred for arrhythmia management.  He was found to have 2-1 AV block and paroxysmal pauses of greater than 4 seconds prior to a pacemaker placement in August 2021 by Dr. Johney Frame.  A left bundle position lead was placed at that time.  He was admitted in March 2024 with vague symptoms concerning for stroke.  During the admission he was found to have an EF of 20 to 25%.  Coreg 3.125 mg twice daily was started prior to discharge.  He was started on dapagliflozin 10, spironolactone 25 yesterday.  Since his last visit, he had a CMR that showed EF < 15% with minimal LGE. This indicated a decline in EF despite having been on dapaglifozin, spironolactone, carvedilol, and entresto.  Past Medical History:  Diagnosis Date   Bradycardia    Second degree AV block    Stroke [facility])     Past Surgical History:  Procedure Laterality Date   BUBBLE STUDY  08/13/2022   Procedure: BUBBLE STUDY;  Surgeon: Christell Constant, MD;  Location: [facility] ENDOSCOPY;  Service: Cardiovascular;;   PACEMAKER IMPLANT N/A 12/20/2019   Procedure: PACEMAKER IMPLANT;  Surgeon: Hillis Range, MD;  Location: [facility] INVASIVE CV LAB;  Service: Cardiovascular;  Laterality: N/A;   RIGHT/LEFT HEART CATH AND CORONARY ANGIOGRAPHY N/A 09/28/2022   Procedure: RIGHT/LEFT HEART CATH AND CORONARY ANGIOGRAPHY;  Surgeon: Laurey Morale, MD;  Location: [facility] INVASIVE CV LAB;  Service: Cardiovascular;  Laterality: N/A;   TEE WITHOUT CARDIOVERSION N/A 08/13/2022   Procedure: TRANSESOPHAGEAL ECHOCARDIOGRAM (TEE);  Surgeon:  Christell Constant, MD;  Location: [facility] ENDOSCOPY;  Service: Cardiovascular;  Laterality: N/A;    Current Medications: No outpatient medications have been marked as taking for the 10/12/22 encounter (Office Visit) with Tahiri Shareef, Roberts Gaudy, MD.     Allergies:   Patient has no known allergies.   Social and Family History: Reviewed in Epic  ROS:   Please see the history of present illness.    All other systems reviewed and are negative.  EKGs/Labs/Other Studies Reviewed Today:    Echocardiogram:  08/12/22 EF 20-25% with global hypokinesis   Monitors:   Stress testing:   Advanced imaging:  CMR 10/07/2022     EF 14% with prominent septal-lateral dyssynchrony  EKG:  Last EKG results: yesterday - reviewed by me -- A-sensed, V-paced   Recent Labs: 08/09/2022: TSH 0.063 08/10/2022: ALT 11 08/13/2022: Magnesium 2.0 09/28/2022: Hemoglobin 16.0; Hemoglobin 15.6; Platelets 218 10/06/2022: B Natriuretic Peptide 146.2; BUN 26; Creatinine, Ser 1.09; Potassium 4.6; Sodium 137     Physical Exam:    VS:  BP (!) 88/60   Pulse (!) 102   Ht 5\' 10"  (1.778 m)   Wt 136 lb 12.8 oz (62.1 kg)   SpO2 98%   BMI 19.63 kg/m     Wt Readings from Last 3 Encounters:  10/12/22 136 lb 12.8 oz (62.1 kg)  10/06/22 136 lb 9.6 oz (62 kg)  09/28/22 144 lb (65.3 kg)     GEN: Well nourished, well developed in no acute distress  CARDIAC: RRR, no murmurs, rubs, gallops The device site is normal -- no tenderness, edema, drainage, redness, threatened erosion. RESPIRATORY:  Normal work of breathing MUSCULOSKELETAL: no edema    ASSESSMENT & PLAN:    Symptomatic second degree AV block Normal device function LBB lead - LV activation is not optimal; QRS is fairly wide but LVAT appears ok. Risk of pacing-induced cardiomyopathy is not as clear as with RV apical pacing. He is device dependent  Medtronic dual chamber pacemaker RV lead is in the LBB position  CHFrEF Diagnosis at the time of an  admission for stroke EF continues to decline despite titration of GDMT - I do not think the procedure should be delayed; EF is not expected to improve -- much less improve beyond 35%. 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 We discussed options. I have hope that, because there is low LGE on CMR, he may have recovery of EF with re-synchronization. Will plan to upgrade his existing device with additional of an LV lead.  I discussed the indication for the procedure and the logistics, risks, potential benefit, and after care. I specifically explained that risks include but are not limited to infection, bleeding,damage to blood vessels, lung, and the heart -- but risk of prolonged hospitalization, need for surgery, or the event of stroke, heart attack, or death are low but not zero.          I will see again in about a month.  Medication Adjustments/Labs and Tests Ordered: Current medicines are reviewed at length with the patient today.  Concerns regarding medicines are outlined above.  No orders of the defined types were placed in this encounter.  No orders of the defined types were placed in this encounter.    Signed, Maurice Small, MD  10/12/2022 3:58 PM    [facility]

## 2022-10-12 NOTE — Patient Instructions (Signed)
Medication Instructions:  Your physician recommends that you continue on your current medications as directed. Please refer to the Current Medication list given to you today. *If you need a refill on your cardiac medications before your next appointment, please call your pharmacy*   Testing/Procedures: Upgrade to BiV pacemaker    Follow-Up: At [facility], you and your health needs are our priority.  As part of our continuing mission to provide you with exceptional heart care, we have created designated Provider Care Teams.  These Care Teams include your primary Cardiologist (physician) and Advanced Practice Providers (APPs -  Physician Assistants and Nurse Practitioners) who all work together to provide you with the care you need, when you need it.  We recommend signing up for the patient portal called "MyChart".  Sign up information is provided on this After Visit Summary.  MyChart is used to connect with patients for Virtual Visits (Telemedicine).  Patients are able to view lab/test results, encounter notes, upcoming appointments, etc.  Non-urgent messages can be sent to your provider as well.   To learn more about what you can do with MyChart, go to [email or web address].    Your next appointment:   We will schedule follow up after BiV upgrade  Provider:   York Pellant, MD

## 2022-10-13 ENCOUNTER — Ambulatory Visit: Payer: No Typology Code available for payment source

## 2022-10-13 ENCOUNTER — Ambulatory Visit: Payer: No Typology Code available for payment source | Admitting: Occupational Therapy

## 2022-10-13 DIAGNOSIS — R41841 Cognitive communication deficit: Secondary | ICD-10-CM

## 2022-10-13 DIAGNOSIS — R4701 Aphasia: Secondary | ICD-10-CM | POA: Diagnosis not present

## 2022-10-13 DIAGNOSIS — R480 Dyslexia and alexia: Secondary | ICD-10-CM

## 2022-10-13 DIAGNOSIS — R41840 Attention and concentration deficit: Secondary | ICD-10-CM

## 2022-10-13 DIAGNOSIS — I69318 Other symptoms and signs involving cognitive functions following cerebral infarction: Secondary | ICD-10-CM

## 2022-10-13 NOTE — Therapy (Signed)
OUTPATIENT SPEECH LANGUAGE PATHOLOGY TREATMENT   Patient Name: Luke Jimenez MRN: [id number] DOB:1965/09/21, 57 y.o., male Today's Date: 10/13/2022  PCP: Shon Hale MD (Documentation) REFERRING PROVIDER: Meredeth Ide, MD  END OF SESSION:  End of Session - 10/13/22 0958     Visit Number 8    Number of Visits 17    Date for SLP Re-Evaluation 11/17/22    SLP Start Time 0936    SLP Stop Time  1016    SLP Time Calculation (min) 40 min    Activity Tolerance Patient tolerated treatment well                Past Medical History:  Diagnosis Date   Bradycardia    Second degree AV block    Stroke [facility])    Past Surgical History:  Procedure Laterality Date   BUBBLE STUDY  08/13/2022   Procedure: BUBBLE STUDY;  Surgeon: Christell Constant, MD;  Location: [facility] ENDOSCOPY;  Service: Cardiovascular;;   PACEMAKER IMPLANT N/A 12/20/2019   Procedure: PACEMAKER IMPLANT;  Surgeon: Hillis Range, MD;  Location: [facility] INVASIVE CV LAB;  Service: Cardiovascular;  Laterality: N/A;   RIGHT/LEFT HEART CATH AND CORONARY ANGIOGRAPHY N/A 09/28/2022   Procedure: RIGHT/LEFT HEART CATH AND CORONARY ANGIOGRAPHY;  Surgeon: Laurey Morale, MD;  Location: [facility] INVASIVE CV LAB;  Service: Cardiovascular;  Laterality: N/A;   TEE WITHOUT CARDIOVERSION N/A 08/13/2022   Procedure: TRANSESOPHAGEAL ECHOCARDIOGRAM (TEE);  Surgeon: Christell Constant, MD;  Location: [facility] ENDOSCOPY;  Service: Cardiovascular;  Laterality: N/A;   Patient Active Problem List   Diagnosis Date Noted   Heart failure, type unknown (HCC) 09/06/2022   Cardiomyopathy (HCC) 08/13/2022   Cerebrovascular accident (CVA) due to embolism of precerebral artery (HCC) 08/13/2022   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) 08/11/2022   Cryptogenic stroke (HCC) 08/11/2022   Seizure (HCC) 08/10/2022   AMS (altered mental status) 08/09/2022   ATRIOVENTRICULAR BLOCK, 2ND DEGREE 06/17/2010    ONSET DATE: 08/09/22   REFERRING  DIAG:  I63.10 (ICD-10-CM) - Cerebrovascular accident (CVA) due to embolism of precerebral artery    THERAPY DIAG:  Aphasia  Cognitive communication deficit  Dyslexia and alexia  Rationale for Evaluation and Treatment: Rehabilitation  SUBJECTIVE:   SUBJECTIVE STATEMENT: "They have pretty much ruled out other causes (of the CVA)." Pt accompanied by: significant other wife [name]  PERTINENT HISTORY: Pt is a 57 y.o. male who presented to ED with complaints of nonspecific complaints of feeling off Pt family concerned that he was having difficulty with speaking over the phone. Also was having difficulty finding words with forgetfulness. Pt had slight headache yesterday but no other symptoms. CT head (08/09/22) showed hypodensity in the left temporal lobe suspicious for acute infarct. MRI brain (08/11/22) revealed "Acute left PCA territory infarcts". EEG (08/12/22) "suggestive of cortical dysfunction arising from left temporal region likely secondary to underlying stroke, post-ictal state. No further seizures were noted". PMH: second-degree AV block with pacemaker  PAIN:  Are you having pain? No  FALLS: Has patient fallen in last 6 months?  No  PATIENT GOALS: Improve reading to be able to return to work  OBJECTIVE:   DIAGNOSTIC FINDINGS:  SLE 08/12/22: Assessment / Plan / Recommendation Clinical Impression     Pt presents with primarily cognitive communication deficits, with very few word finding difficulites and likely visual recognition deficits. He is oriented fully to self and place with difficulty recalling year (1994) and reason for hospitalization. Immediate recall of listed items  and perfomance with simple mathematical calculation were [facility]. Cognitive deificts noted were in the areas of delayed recall (2/5 items recalled), complex verbal problem solving and executive function (inferencing, self-monitoring/correcting). During informal conversation, no word finding or expressive language  difficulties noted. Perseveration/hesistation exhibited during confrontational naming task in x1 instance. Receptive language appeared [facility] for tasks provided. During reading (words/sentences) and picture scene task, pt with difficulty recognizing/discriminating few pictured objects/words. Some hesitations evident during reading task and pt reports no issues with visual acuity, but with some delay in recognizing word. Would need to r/o any other visual deficits to determine if solely a recognition/discrimination issue. Recommend SLP services f/u acutely to treat primarily for cognitive communication function. Ongoing assessment/ dx treatment for language function also warranted. Wife present in room/via phone for majority of session is in agreement with recommendations.       PATIENT REPORTED OUTCOME MEASURES (PROM): To be completed week of 10/11/22.    TODAY'S TREATMENT:                                                                                                                                         DATE:  10/13/22: Pt stated his reading was improving and now he is only having difficulty with long/er words.SLP educated pt on speak function with iOS today with text messages. Today pt read a 7 sentence text message to SLP with mild and rare hesitation/pausing. Pt stated, "It seems sometime I'm missing the first letters of the words" and SLP told pt it appears that this would be a sx of decr'd attention to task when reading and reminded pt of compensatory strategies of using his finger or a straightedge to support his attention when reading.  10/07/22: SLP to provide pt with PROM next session. SLP worked with pt reading using his memory strategy handout rec'd in OT. Pt with hesitation with multisyllable words and SLP had pt implement technique discussed last session. Pt and wife had not had time to work on reading due to family plans the previous two days. Pt made errors reading on functor words and  req'd SLP cues for awareness.   10/04/21: SLP suggested pt and wife do more reading homework with the suggestions by SLP implemented (see pt instructions). Pt and wife will complete reading homework together for 25-30 minutes, x5 days/week.  SLP asked pt to think about how these deficits would impact him at work and pt stated he would need to write more notes for recall than he did prior to CVA. Pt does not know how reading deficit will affect him at work due to [employer] being another language, with other context cues.  09/30/22: Further targeted and analyzed reading today. Pt endorsed some reduced word recognition, particularly with consonant clusters and multisyllabic words. Targeted oral reading of Rainbow passage. SLP noted intermittent delays with more common sight words (ex: white, beautiful) as well as possible  phonemic paraphasias (ex: when/then, universal/unusual, bright/bridge). Only self-corrected error x1. Encouraged metacognitive analysis of reading ability, in which pt identified not attending to entire word resulting in errors. Recommended visual supports (finger, visual marker) to aid attention and processing speed. Targeted minimal pairs, in which pt required some extended time and occasional mod A to correct errors. Recommended functional reading at home to aid word recognition.   09/27/22: Addressed reading with use of compensations to aid in attention. Pt benefiting from oral reading, aiding in recall of x7 pertinent details from paragraph level stimuli, in spite of occasional disruption in reading fluency. Provided education on use of text to speech settings on [employer] products to aid in reading practice. Recommend reading with support, then returning and re-reading IND to aid in increased fluency. Pt to trial reading aloud to optimize attention and recall of read material.   09/23/22: Pt and wife and SLP talked about pt's cognitive deficits - pt and wife agreed pt's overall processing is slower  than before CVA. SLP could initiate (or complete) CLQT with pt next session. Hridhaan also endorses some deficits in higher level attention ("I just have to do one thing and then do the other thing - I can't do them together anymore."). With extra time allowed, pt showed good (not excellent) insight what he might need to tell his supervisor if he were told to return to work this week. During this discussion pt indicated he was more tired now than pre-CVA. 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 SLP continued the Reading Comprehenstion Battery of Aphasia - 2nd Ed. (RCBA-2); SLP-Calil Amor to complete the last subtest the week of 10-04-22.  09/14/22: SLP initiated Reading Comprehenstion Battery of Aphasia - 2nd Ed. (RCBA-2). 8/10 subtests completed.   Pt with incr'd time necessary for all subtests. Single words - choice of f:3 words to match to pic took approx 13 seconds per picture. SLP to cont assessment next session. SLP told pt to continue to read at home - pt states he is getting good practice reading text messages.   09/08/22: Suggested home tasks for pt and wife  PATIENT EDUCATION: Education details: Results of eval, therapy course, home tasks Person educated: Patient and Spouse Education method: Explanation, Demonstration, and Verbal cues Education comprehension: verbalized understanding, returned demonstration, verbal cues required, and needs further education   GOALS: Goals reviewed with patient? No  SHORT TERM GOALS: Target date: 10/14/22  Pt will read 8-10 word sentence selections and indicate correct answers to written or spoken questions 100% success with extra time in 3 sessions Baseline: Goal status: MET  2.  Pt will demo understanding of mod complex emails of 10+ sentences with 100% success with compensations, in three sessions Baseline:  Goal status: MET  3.  Pt will write semi-technical 10+ sentence  (work-like) messages and/or text messages with 100% success in 3 sessions Baseline:  Goal status: NOT MET - in LTGs  4.  Pt will undergo cognitive linguistic testing if clinically necessary Baseline:  Goal status: DEFERRED   LONG TERM GOALS: Target date: 11/16/22  Pt will have higher/better score on PROM compared to initial reading Baseline:  Goal status: IN PROGRESS  2.  Pt will read 15+ sentence mod complex selections with compensations in 3 sessions Baseline:  Goal status: IN PROGRESS  3.  Pt will write semi-technical 8+ sentence (work-like) messages and/or text messages with 100% success in 3 sessions Baseline:  Goal status: REVISED (from STGs)  4.  Pt will write 12-sentence selections (work like emails, texts, etc)  with compensations in 3 sessions Baseline:  Goal status: REVISED   ASSESSMENT:  CLINICAL IMPRESSION: Patient is a 57 y.o. male who was seen today for treatment of cognitive linguistics and with reading in light of CVAs in March. See today's "treatment" section for additional details. Pt's OT stated pt demonstrated cognitive deficits such as processing/mental calculation, and memory. Decided SLP will focus on reading and attention in reading, with OT targeting general cognition. 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    OBJECTIVE IMPAIRMENTS: include memory, expressive language, and aphasia. These impairments are limiting patient from return to work, ADLs/IADLs, and effectively communicating at home and in community. Factors affecting potential to achieve goals and functional outcome are ability to learn/carryover information. Patient will benefit from skilled SLP services to address above impairments and improve overall function.  REHAB POTENTIAL: Good  PLAN:  SLP FREQUENCY: 2x/week  SLP DURATION: 8 weeks  PLANNED INTERVENTIONS: Language facilitation, Environmental controls, Cueing hierachy,  Cognitive reorganization, Internal/external aids, Functional tasks, Multimodal communication approach, SLP instruction and feedback, Compensatory strategies, and Patient/family education    [facility], CCC-SLP 10/13/2022, 9:58 AM

## 2022-10-13 NOTE — Therapy (Signed)
OUTPATIENT OCCUPATIONAL THERAPY NEURO Treatment Note  Patient Name: Luke Jimenez MRN: [id number] DOB:[birth year], 57 y.o., male Today's Date: 10/13/2022  PCP: Shon Hale, MD REFERRING PROVIDER: Meredeth Ide, MD  END OF SESSION:  OT End of Session - 10/13/22 1023     Visit Number 7    Number of Visits 13    Date for OT Re-Evaluation 11/05/22    Authorization Type [employer] Preferred    OT Start Time 1021   arrival from ST   OT Stop Time 1102    OT Time Calculation (min) 41 min                  Past Medical History:  Diagnosis Date   Bradycardia    Second degree AV block    Stroke [facility])    Past Surgical History:  Procedure Laterality Date   BUBBLE STUDY  08/13/2022   Procedure: BUBBLE STUDY;  Surgeon: Christell Constant, MD;  Location: [facility] ENDOSCOPY;  Service: Cardiovascular;;   PACEMAKER IMPLANT N/A 12/20/2019   Procedure: PACEMAKER IMPLANT;  Surgeon: Hillis Range, MD;  Location: [facility] INVASIVE CV LAB;  Service: Cardiovascular;  Laterality: N/A;   RIGHT/LEFT HEART CATH AND CORONARY ANGIOGRAPHY N/A 09/28/2022   Procedure: RIGHT/LEFT HEART CATH AND CORONARY ANGIOGRAPHY;  Surgeon: Laurey Morale, MD;  Location: [facility] INVASIVE CV LAB;  Service: Cardiovascular;  Laterality: N/A;   TEE WITHOUT CARDIOVERSION N/A 08/13/2022   Procedure: TRANSESOPHAGEAL ECHOCARDIOGRAM (TEE);  Surgeon: Christell Constant, MD;  Location: [facility] ENDOSCOPY;  Service: Cardiovascular;  Laterality: N/A;   Patient Active Problem List   Diagnosis Date Noted   Heart failure, type unknown (HCC) 09/06/2022   Cardiomyopathy (HCC) 08/13/2022   Cerebrovascular accident (CVA) due to embolism of precerebral artery (HCC) 08/13/2022   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) 08/11/2022   Cryptogenic stroke (HCC) 08/11/2022   Seizure (HCC) 08/10/2022   AMS (altered mental status) 08/09/2022   ATRIOVENTRICULAR BLOCK, 2ND DEGREE 06/17/2010    ONSET DATE: 08/09/22  REFERRING  DIAG: I63.10 (ICD-10-CM) - Cerebrovascular accident (CVA) due to embolism of precerebral artery  THERAPY DIAG:  Attention and concentration deficit  Other symptoms and signs involving cognitive functions following cerebral infarctionLama, Sarina Ill, MD  Rationale for Evaluation and Treatment: Rehabilitation  SUBJECTIVE:   SUBJECTIVE STATEMENT: Pt reports that he feels like his memory is improving but "I don't know what I don't remember." Pt accompanied by: self and significant other (spouse [name])  PERTINENT HISTORY: Pt is a 57 y.o. male who presented 08/09/22 with speech difficulty and AMS. 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 EEG also showing seizures. PMH: second-degree AV block with pacemaker, bradycardia  PRECAUTIONS: None  WEIGHT BEARING RESTRICTIONS: No  PAIN:  Are you having pain? No  FALLS: Has patient fallen in last 6 months? No  LIVING ENVIRONMENT: Lives with: lives with their spouse and family nearby Lives in: House/apartment Stairs: Yes: Internal: bedroom/bathroom up full flight of steps; and External: 4-5 steps Has following equipment at home: Grab bars and built in shower seat, hand held shower head  PLOF: Independent, Independent with basic ADLs, and Vocation/Vocational requirements: Quarry manager   PATIENT GOALS: to improve in reading and short term memory  OBJECTIVE:   HAND DOMINANCE: Right  IADLs: Shopping: has gone to store with spouse, with ability to walk away to retreive an item and return to her  Meal Prep: microwave to warm up leftovers, but did not really cook before  Community mobility: not cleared to drive Medication management: spouse is Control and instrumentation engineer: spouse takes care of and did before  COGNITION: Overall cognitive status:  Impaired with significant impairments to time orientation, sequencing, attention, and short term memory/recall  VISION: Subjective  report: no changes  VISION ASSESSMENT: To be further assessed in functional context While in hospital, OT noted questionable R inattention, to be further assessed in subsequent visits  Patient has difficulty with following activities due to following visual impairments: reading - unsure if attention, memory, or visual  TODAY'S TREATMENT:                                    10/13/22 Memory: reports feeling like this hit "where I was weaker at".  Filled pill box over the weekend, utilizing a spreadsheet to organize meds to aid pt in recall and dispensing meds appropriately.  Pt reports organizing pills by AM, PM, or both.  Reports frequently thinking "How can I make this better or simpler." Plans to fill/organize in alphabetical order next time to aid in recall as utilizing med list which is in alphabetical order.   Planning/Organizing: engaged in complex planning/problem solving prompt requiring pt to organize items into systematic schedule to complete all in time allotted. Pt utilizing underlining of key words and writing on lined paper, leaving spaces to allow space to fill in additional information/stops as needed.  Reviewed completing digitally allowing drag and drop options. Discussed pacing and prioritizing activities to ensure enough energy to complete tasks.  Discussed use of online ordering, curb side pickup, and use of digital resources to assist as needed. Pt requiring increased time to organize and sequence stops.     10/07/22 Memory: Pt reports keeping sticky notes with information in regards to where to leave off/return to on work projects and would utilize work calendar for reminders of appts and projects.  Discussed recommendation to come up with additional and/or modified strategies to improve ease and success with recall in regards to medications and appointments.  Recommended use of written and/or digital calendar.  Encouraged use of shared calendar as wife currently keeping up with  all MD appts due to providing transportation and working appts around her work schedule. Reviewed memory strategy handout and encouraged finding at least 2 strategies to initiate and tweak as needed. Organizing: reviewed utilizing systems to increase recall and sequencing.  Completed simulated grocery organization task. Pt utilizing symbols to organize grocery list.  Requiring 4 mins to complete.  Discussed use of grocery apps to aid in recall and organization.    10/05/22 Pipe tree puzzle: engaged in 3D pipe tree puzzle replication with focus on alternating attention, sequencing, and problem solving.  Pt making one error but able to correct without cues, utilizing visual pattern.  Discussed functional carryover with allowance of making safe errors for learning. Scavenger hunt: OT provided pt with 5 items to locate in min distracting treatment gym.  Pt only able to recall 1 of 5 items and then able to recall 2 of 4 upon repetition.  Pt able to locate with increased time due to impaired recall.  Discussed use of written lists and organizing lists as able to facilitate increased carryover and sequencing (such as organizing grocery list per layout). Pill box assessment:  Pt completed pill box test in 4:55 minutes, with 12 omission errors.  Pt read 3x/day and administered x3 for only 3 days out  of the week.  OT then asked pt to read instruction after time was completed with pt able to read aloud and then asked to identify how many pills total would need to be dispensed.  Pt correctly identifying 21, however only placing 9.  Pt with similar error with 2x/day initially placing 2x/week but able to correct without cues.  Reiterated recommendation to complete meds with spouse with pt reading and interpreting to allow time/space to address errors and wife filling pill box.   PATIENT EDUCATION: Education details: ongoing condition specific education and use of pictures or grid to aid in sequencing Person educated:  Patient and Spouse Education method: Explanation Education comprehension: verbalized understanding and needs further education  HOME EXERCISE PROGRAM: TBD   GOALS: Goals reviewed with patient? Yes  SHORT TERM GOALS: Target date: 10/08/22  Pt will verbalize understanding of task modifications and/or potential A/E needs to increase ease, safety, and independence w/ ADLs and IADLs. Baseline:  Goal status: MET - 10/07/22  2.  Pt will be able to complete simulated visual scanning activity w/ at least 95% accuracy (finding targets around therapy gym, searching for items on shelves), incorporating visual and memory compensatory strategies prn Baseline:  Goal status: IN PROGRESS  3.  Pt will demonstrate improved memory, sequencing, and problem solving as demonstrated by improvements in score on pill box assessment by ability to complete in 5 mins time limit with </= to 3 errors. Baseline:  Goal status: IN PROGRESS  4.   Pt will complete table top scanning activity with Supervision, min cues for recall of instructions, sequencing during task. Baseline:  Goal status: MET - 10/05/22  5.   Pt will be able to demonstrate ability to alternate attention for 5 mins during table top task. Baseline:  Goal status: NOT MET - 10/07/22  LONG TERM GOALS: Target date: 11/05/22  Pt will demonstrate ability to sequence simple functional task (simple snack prep, laundry task, etc) at Mod I level with good safety awareness and ability to utilize memory strategies for recall. Baseline:  Goal status: IN PROGRESS  2.  Pt will navigate a moderately busy environment, completing dual task activity and/or following multi-step commands with 90% accuracy Baseline:  Goal status: IN PROGRESS  3.  Pt will complete simulated medication management activity w/ Mod Ind by discharge, incorporating compensatory strategies/AE prn Baseline:  Goal status: IN PROGRESS  4.  Pt will be independent with utilization of memory  strategies to allow for increased independence with managing personal appointments. Baseline:  Goal status: IN PROGRESS  5.  Pt will be able to demonstrate ability to divide attention for 5 mins during functional task. Baseline:  Goal status: IN PROGRESS   ASSESSMENT:  CLINICAL IMPRESSION: Pt requiring increased time with sequencing activity and use of underlining and rewriting tasks to increase success in recall and organization.  Pt continues to utilize "checks and balances" and discussed digital options to enhance ability to complete tasks. Pt continues to process aloud and is beginning to integrate memory strategies and utilize organization of phone apps and shared calendar to aid in memory and sequencing.  PERFORMANCE DEFICITS: in functional skills including IADLs, endurance, cardiopulmonary status limiting function, decreased knowledge of precautions, and decreased knowledge of use of DME, cognitive skills including attention, memory, orientation, problem solving, and sequencing, and psychosocial skills including coping strategies, environmental adaptation, and routines and behaviors.   IMPAIRMENTS: are limiting patient from IADLs, work, and social participation.   CO-MORBIDITIES: may have co-morbidities  that affects  occupational performance. Patient will benefit from skilled OT to address above impairments and improve overall function.  MODIFICATION OR ASSISTANCE TO COMPLETE EVALUATION: Min-Moderate modification of tasks or assist with assess necessary to complete an evaluation.  OT OCCUPATIONAL PROFILE AND HISTORY: Problem focused assessment: Including review of records relating to presenting problem.  CLINICAL DECISION MAKING: Moderate - several treatment options, min-mod task modification necessary  REHAB POTENTIAL: Good  EVALUATION COMPLEXITY: Moderate    PLAN:  OT FREQUENCY: 1-2x/week  OT DURATION: 8 weeks  PLANNED INTERVENTIONS: self care/ADL training, therapeutic  activity, neuromuscular re-education, functional mobility training, patient/family education, cognitive remediation/compensation, visual/perceptual remediation/compensation, psychosocial skills training, energy conservation, and coping strategies training  RECOMMENDED OTHER SERVICES: SLP   CONSULTED AND AGREED WITH PLAN OF CARE: Patient and family member/caregiver  PLAN FOR NEXT SESSION: alternating attention, educate on memory strategies (checks and balances, checking system).  Complete organization of day activity and scavenger hunt with use of memory strategies.   Rosalio Loud, OTR/L 10/13/2022, 11:40 AM

## 2022-10-14 NOTE — Addendum Note (Signed)
Addended by: Verdie Mosher B on: 10/14/2022 08:43 AM   Modules accepted: Orders

## 2022-10-15 ENCOUNTER — Ambulatory Visit: Payer: No Typology Code available for payment source

## 2022-10-15 ENCOUNTER — Ambulatory Visit: Payer: No Typology Code available for payment source | Admitting: Occupational Therapy

## 2022-10-15 DIAGNOSIS — R480 Dyslexia and alexia: Secondary | ICD-10-CM

## 2022-10-15 DIAGNOSIS — R41840 Attention and concentration deficit: Secondary | ICD-10-CM

## 2022-10-15 DIAGNOSIS — I69318 Other symptoms and signs involving cognitive functions following cerebral infarction: Secondary | ICD-10-CM

## 2022-10-15 DIAGNOSIS — R41841 Cognitive communication deficit: Secondary | ICD-10-CM

## 2022-10-15 DIAGNOSIS — R4701 Aphasia: Secondary | ICD-10-CM | POA: Diagnosis not present

## 2022-10-15 NOTE — Therapy (Signed)
OUTPATIENT OCCUPATIONAL THERAPY NEURO Treatment Note  Patient Name: Luke Jimenez MRN: [id number] DOB:[birth year], 57 y.o., male Today's Date: 10/15/2022  PCP: Shon Hale, MD REFERRING PROVIDER: Meredeth Ide, MD  END OF SESSION:  OT End of Session - 10/15/22 0853     Visit Number 8    Number of Visits 13    Date for OT Re-Evaluation 11/05/22    Authorization Type [employer]/[place] Preferred    OT Start Time 0849    OT Stop Time 0933    OT Time Calculation (min) 44 min                   Past Medical History:  Diagnosis Date   Bradycardia    Second degree AV block    Stroke [facility])    Past Surgical History:  Procedure Laterality Date   BUBBLE STUDY  08/13/2022   Procedure: BUBBLE STUDY;  Surgeon: Christell Constant, MD;  Location: [facility] ENDOSCOPY;  Service: Cardiovascular;;   PACEMAKER IMPLANT N/A 12/20/2019   Procedure: PACEMAKER IMPLANT;  Surgeon: Hillis Range, MD;  Location: [facility] INVASIVE CV LAB;  Service: Cardiovascular;  Laterality: N/A;   RIGHT/LEFT HEART CATH AND CORONARY ANGIOGRAPHY N/A 09/28/2022   Procedure: RIGHT/LEFT HEART CATH AND CORONARY ANGIOGRAPHY;  Surgeon: Laurey Morale, MD;  Location: [facility] INVASIVE CV LAB;  Service: Cardiovascular;  Laterality: N/A;   TEE WITHOUT CARDIOVERSION N/A 08/13/2022   Procedure: TRANSESOPHAGEAL ECHOCARDIOGRAM (TEE);  Surgeon: Christell Constant, MD;  Location: [facility] ENDOSCOPY;  Service: Cardiovascular;  Laterality: N/A;   Patient Active Problem List   Diagnosis Date Noted   Heart failure, type unknown (HCC) 09/06/2022   Cardiomyopathy (HCC) 08/13/2022   Cerebrovascular accident (CVA) due to embolism of precerebral artery (HCC) 08/13/2022   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) 08/11/2022   Cryptogenic stroke (HCC) 08/11/2022   Seizure (HCC) 08/10/2022   AMS (altered mental status) 08/09/2022   ATRIOVENTRICULAR BLOCK, 2ND DEGREE 06/17/2010    ONSET DATE: 08/09/22  REFERRING DIAG: I63.10  (ICD-10-CM) - Cerebrovascular accident (CVA) due to embolism of precerebral artery  THERAPY DIAG:  Attention and concentration deficit  Other symptoms and signs involving cognitive functions following cerebral infarctionLama, Sarina Ill, MD  Rationale for Evaluation and Treatment: Rehabilitation  SUBJECTIVE:   SUBJECTIVE STATEMENT: Pt reports feeling more groggy in the mornings, but going to bed earlier and sleeping later in the morning.   Pt accompanied by: self and significant other (spouse [name])  PERTINENT HISTORY: Pt is a 57 y.o. male who presented 08/09/22 with speech difficulty and AMS. 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 EEG also showing seizures. PMH: second-degree AV block with pacemaker, bradycardia  PRECAUTIONS: None  WEIGHT BEARING RESTRICTIONS: No  PAIN:  Are you having pain? No  FALLS: Has patient fallen in last 6 months? No  LIVING ENVIRONMENT: Lives with: lives with their spouse and family nearby Lives in: House/apartment Stairs: Yes: Internal: bedroom/bathroom up full flight of steps; and External: 4-5 steps Has following equipment at home: Grab bars and built in shower seat, hand held shower head  PLOF: Independent, Independent with basic ADLs, and Vocation/Vocational requirements: Quarry manager   PATIENT GOALS: to improve in reading and short term memory  OBJECTIVE:   HAND DOMINANCE: Right  IADLs: Shopping: has gone to store with spouse, with ability to walk away to retreive an item and return to her  Meal Prep: microwave to warm up leftovers, but did not really cook before  Community mobility: not cleared to drive Medication management: spouse is Control and instrumentation engineer: spouse takes care of and did before  COGNITION: Overall cognitive status:  Impaired with significant impairments to time orientation, sequencing, attention, and short term memory/recall  VISION: Subjective  report: no changes  VISION ASSESSMENT: To be further assessed in functional context While in hospital, OT noted questionable R inattention, to be further assessed in subsequent visits  Patient has difficulty with following activities due to following visual impairments: reading - unsure if attention, memory, or visual  TODAY'S TREATMENT:                                    10/15/22 Therapeutic activity: engaged in [employer] hour" activity requiring pt to replicate pattern of cars in grid challenging visual sequencing, memory, and problem solving. Pt requiring increased time when setting up  - however without cues other than pattern. Educated on functional carryover as it pertains to sequencing and organization to aid in memory. Memory: engaged in recall activity while ambulating outside to facilitate cognitive/motor dual tasking.  Pt challenged to recall 5 words (of items that would be passed during walk).  Pt able to recall 3/5 after ~5 mins.  OT reiterated use of memory strategies with categorizing items, writing items to increase.    10/13/22 Memory: reports feeling like this hit "where I was weaker at".  Filled pill box over the weekend, utilizing a spreadsheet to organize meds to aid pt in recall and dispensing meds appropriately.  Pt reports organizing pills by AM, PM, or both.  Reports frequently thinking "How can I make this better or simpler." Plans to fill/organize in alphabetical order next time to aid in recall as utilizing med list which is in alphabetical order.   Planning/Organizing: engaged in complex planning/problem solving prompt requiring pt to organize items into systematic schedule to complete all in time allotted. Pt utilizing underlining of key words and writing on lined paper, leaving spaces to allow space to fill in additional information/stops as needed.  Reviewed completing digitally allowing drag and drop options. Discussed pacing and prioritizing activities to ensure enough  energy to complete tasks.  Discussed use of online ordering, curb side pickup, and use of digital resources to assist as needed. Pt requiring increased time to organize and sequence stops.     10/07/22 Memory: Pt reports keeping sticky notes with information in regards to where to leave off/return to on work projects and would utilize work calendar for reminders of appts and projects.  Discussed recommendation to come up with additional and/or modified strategies to improve ease and success with recall in regards to medications and appointments.  Recommended use of written and/or digital calendar.  Encouraged use of shared calendar as wife currently keeping up with all MD appts due to providing transportation and working appts around her work schedule. Reviewed memory strategy handout and encouraged finding at least 2 strategies to initiate and tweak as needed. Organizing: reviewed utilizing systems to increase recall and sequencing.  Completed simulated grocery organization task. Pt utilizing symbols to organize grocery list.  Requiring 4 mins to complete.  Discussed use of grocery apps to aid in recall and organization.   PATIENT EDUCATION: Education details: ongoing condition specific education and use of pictures or grid to aid in sequencing Person educated: Patient and Spouse Education method: Explanation Education comprehension: verbalized understanding and needs further education  HOME EXERCISE PROGRAM:  TBD   GOALS: Goals reviewed with patient? Yes  SHORT TERM GOALS: Target date: 10/08/22  Pt will verbalize understanding of task modifications and/or potential A/E needs to increase ease, safety, and independence w/ ADLs and IADLs. Baseline:  Goal status: MET - 10/07/22  2.  Pt will be able to complete simulated visual scanning activity w/ at least 95% accuracy (finding targets around therapy gym, searching for items on shelves), incorporating visual and memory compensatory strategies  prn Baseline:  Goal status: IN PROGRESS  3.  Pt will demonstrate improved memory, sequencing, and problem solving as demonstrated by improvements in score on pill box assessment by ability to complete in 5 mins time limit with </= to 3 errors. Baseline:  Goal status: IN PROGRESS  4.   Pt will complete table top scanning activity with Supervision, min cues for recall of instructions, sequencing during task. Baseline:  Goal status: MET - 10/05/22  5.   Pt will be able to demonstrate ability to alternate attention for 5 mins during table top task. Baseline:  Goal status: NOT MET - 10/07/22  LONG TERM GOALS: Target date: 11/05/22  Pt will demonstrate ability to sequence simple functional task (simple snack prep, laundry task, etc) at Mod I level with good safety awareness and ability to utilize memory strategies for recall. Baseline:  Goal status: IN PROGRESS  2.  Pt will navigate a moderately busy environment, completing dual task activity and/or following multi-step commands with 90% accuracy Baseline:  Goal status: IN PROGRESS  3.  Pt will complete simulated medication management activity w/ Mod Ind by discharge, incorporating compensatory strategies/AE prn Baseline:  Goal status: IN PROGRESS  4.  Pt will be independent with utilization of memory strategies to allow for increased independence with managing personal appointments. Baseline:  Goal status: IN PROGRESS  5.  Pt will be able to demonstrate ability to divide attention for 5 mins during functional task. Baseline:  Goal status: IN PROGRESS   ASSESSMENT:  CLINICAL IMPRESSION: Pt reports sleeping more s/p CVA than prior to stroke and still feeling groggy when he wakes up.  Discussed possible medication impacting arousal as well as increased demand on body to recover from CVA causing fatigue.  Pt demonstrating improved attention to table top task this session with sequencing of visual perceptual "puzzle".  Pt with ability to  recall 3/5 words after ambulation ~5 mins and distractions.  PERFORMANCE DEFICITS: in functional skills including IADLs, endurance, cardiopulmonary status limiting function, decreased knowledge of precautions, and decreased knowledge of use of DME, cognitive skills including attention, memory, orientation, problem solving, and sequencing, and psychosocial skills including coping strategies, environmental adaptation, and routines and behaviors.   IMPAIRMENTS: are limiting patient from IADLs, work, and social participation.   CO-MORBIDITIES: may have co-morbidities  that affects occupational performance. Patient will benefit from skilled OT to address above impairments and improve overall function.  MODIFICATION OR ASSISTANCE TO COMPLETE EVALUATION: Min-Moderate modification of tasks or assist with assess necessary to complete an evaluation.  OT OCCUPATIONAL PROFILE AND HISTORY: Problem focused assessment: Including review of records relating to presenting problem.  CLINICAL DECISION MAKING: Moderate - several treatment options, min-mod task modification necessary  REHAB POTENTIAL: Good  EVALUATION COMPLEXITY: Moderate    PLAN:  OT FREQUENCY: 1-2x/week  OT DURATION: 8 weeks  PLANNED INTERVENTIONS: self care/ADL training, therapeutic activity, neuromuscular re-education, functional mobility training, patient/family education, cognitive remediation/compensation, visual/perceptual remediation/compensation, psychosocial skills training, energy conservation, and coping strategies training  RECOMMENDED OTHER SERVICES: SLP  CONSULTED AND AGREED WITH PLAN OF CARE: Patient and family member/caregiver  PLAN FOR NEXT SESSION: alternating attention, educate on memory strategies (checks and balances, checking system).  Complete organization of day activity and scavenger hunt with use of memory strategies.   Myrene Bougher, OTR/L 10/15/2022, 10:03 AM

## 2022-10-15 NOTE — Therapy (Signed)
OUTPATIENT SPEECH LANGUAGE PATHOLOGY TREATMENT   Patient Name: Luke Jimenez MRN: [id number] DOB:[birth year], 57 y.o., male Today's Date: 10/13/2022  PCP: Shon Hale MD (Documentation) REFERRING PROVIDER: Meredeth Ide, MD  END OF SESSION:  End of Session - 10/13/22 0958     Visit Number 8    Number of Visits 17    Date for SLP Re-Evaluation 11/17/22    SLP Start Time 0936    SLP Stop Time  1016    SLP Time Calculation (min) 40 min    Activity Tolerance Patient tolerated treatment well                Past Medical History:  Diagnosis Date   Bradycardia    Second degree AV block    Stroke [facility])    Past Surgical History:  Procedure Laterality Date   BUBBLE STUDY  08/13/2022   Procedure: BUBBLE STUDY;  Surgeon: Christell Constant, MD;  Location: [facility] ENDOSCOPY;  Service: Cardiovascular;;   PACEMAKER IMPLANT N/A 12/20/2019   Procedure: PACEMAKER IMPLANT;  Surgeon: Hillis Range, MD;  Location: [facility] INVASIVE CV LAB;  Service: Cardiovascular;  Laterality: N/A;   RIGHT/LEFT HEART CATH AND CORONARY ANGIOGRAPHY N/A 09/28/2022   Procedure: RIGHT/LEFT HEART CATH AND CORONARY ANGIOGRAPHY;  Surgeon: Laurey Morale, MD;  Location[facility] INVASIVE CV LAB;  Service: Cardiovascular;  Laterality: N/A;   TEE WITHOUT CARDIOVERSION N/A 08/13/2022   Procedure: TRANSESOPHAGEAL ECHOCARDIOGRAM (TEE);  Surgeon: Christell Constant, MD;  Location: [facility] ENDOSCOPY;  Service: Cardiovascular;  Laterality: N/A;   Patient Active Problem List   Diagnosis Date Noted   Heart failure, type unknown (HCC) 09/06/2022   Cardiomyopathy (HCC) 08/13/2022   Cerebrovascular accident (CVA) due to embolism of precerebral artery (HCC) 08/13/2022   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) 08/11/2022   Cryptogenic stroke (HCC) 08/11/2022   Seizure (HCC) 08/10/2022   AMS (altered mental status) 08/09/2022   ATRIOVENTRICULAR BLOCK, 2ND DEGREE 06/17/2010    ONSET DATE: 08/09/22   REFERRING  DIAG:  I63.10 (ICD-10-CM) - Cerebrovascular accident (CVA) due to embolism of precerebral artery    THERAPY DIAG:  Aphasia  Cognitive communication deficit  Dyslexia and alexia  Rationale for Evaluation and Treatment: Rehabilitation  SUBJECTIVE:   SUBJECTIVE STATEMENT: "I've noticed that the long words are better." Pt accompanied by: significant other wife Luke Jimenez  PERTINENT HISTORY: Pt is a 57 y.o. male who presented to ED with complaints of nonspecific complaints of feeling off Pt family concerned that he was having difficulty with speaking over the phone. Also was having difficulty finding words with forgetfulness. Pt had slight headache yesterday but no other symptoms. CT head (08/09/22) showed hypodensity in the left temporal lobe suspicious for acute infarct. MRI brain (08/11/22) revealed "Acute left PCA territory infarcts". EEG (08/12/22) "suggestive of cortical dysfunction arising from left temporal region likely secondary to underlying stroke, post-ictal state. No further seizures were noted". PMH: second-degree AV block with pacemaker  PAIN:  Are you having pain? No  FALLS: Has patient fallen in last 6 months?  No  PATIENT GOALS: Improve reading to be able to return to work  OBJECTIVE:   DIAGNOSTIC FINDINGS:  SLE 08/12/22: Assessment / Plan / Recommendation Clinical Impression     Pt presents with primarily cognitive communication deficits, with very few word finding difficulites and likely visual recognition deficits. He is oriented fully to self and place with difficulty recalling year (1994) and reason for hospitalization. Immediate recall of listed items and perfomance with  simple mathematical calculation were [facility]. Cognitive deificts noted were in the areas of delayed recall (2/5 items recalled), complex verbal problem solving and executive function (inferencing, self-monitoring/correcting). During informal conversation, no word finding or expressive language difficulties  noted. Perseveration/hesistation exhibited during confrontational naming task in x1 instance. Receptive language appeared [facility] [employer] for tasks provided. During reading (words/sentences) and picture scene task, pt with difficulty recognizing/discriminating few pictured objects/words. Some hesitations evident during reading task and pt reports no issues with visual acuity, but with some delay in recognizing word. Would need to r/o any other visual deficits to determine if solely a recognition/discrimination issue. Recommend SLP services f/u acutely to treat primarily for cognitive communication function. Ongoing assessment/ dx treatment for language function also warranted. Wife present in room/via phone for majority of session is in agreement with recommendations.       PATIENT REPORTED OUTCOME MEASURES (PROM): To be completed week of 10/11/22.    TODAY'S TREATMENT:                                                                                                                                         DATE:  10/15/22: Since last session, pt kept his place with lining up the digital text with the top line and this has been successful as a place marker to incr attention. Pt has not used "speak" function on the iPhone yet. Slp told pt to get some Cobal text over the weekend and see if he can understand it. In multiple paragraph selections, pt took extra time to read and answered questions 100% with looking back at text 20% of the time.  10/13/22: Pt stated his reading was improving and now he is only having difficulty with long/er words.SLP educated pt on speak function with iOS today with text messages. Today pt read a 7 sentence text message to SLP with mild and rare hesitation/pausing. Pt stated, "It seems sometime I'm missing the first letters of the words" and SLP told pt it appears that this would be a sx of decr'd attention to task when reading and reminded pt of compensatory strategies of using his finger or a  straightedge to support his attention when reading.  10/07/22: SLP to provide pt with PROM next session. SLP worked with pt reading using his memory strategy handout rec'd in OT. Pt with hesitation with multisyllable words and SLP had pt implement technique discussed last session. Pt and wife had not had time to work on reading due to family plans the previous two days. Pt made errors reading on functor words and req'd SLP cues for awareness.   10/04/21: SLP suggested pt and wife do more reading homework with the suggestions by SLP implemented (see pt instructions). Pt and wife will complete reading homework together for 25-30 minutes, x5 days/week.  SLP asked pt to think about how these deficits would impact him at work and pt stated he  would need to write more notes for recall than he did prior to CVA. Pt does not know how reading deficit will affect him at work due to [employer] being another language, with other context cues.  09/30/22: Further targeted and analyzed reading today. Pt endorsed some reduced word recognition, particularly with consonant clusters and multisyllabic words. Targeted oral reading of Rainbow passage. SLP noted intermittent delays with more common sight words (ex: white, beautiful) as well as possible phonemic paraphasias (ex: when/then, universal/unusual, bright/bridge). Only self-corrected error x1. Encouraged metacognitive analysis of reading ability, in which pt identified not attending to entire word resulting in errors. Recommended visual supports (finger, visual marker) to aid attention and processing speed. Targeted minimal pairs, in which pt required some extended time and occasional mod A to correct errors. Recommended functional reading at home to aid word recognition.   09/27/22: Addressed reading with use of compensations to aid in attention. Pt benefiting from oral reading, aiding in recall of x7 pertinent details from paragraph level stimuli, in spite of occasional  disruption in reading fluency. Provided education on use of text to speech settings on apple products to aid in reading practice. Recommend reading with support, then returning and re-reading IND to aid in increased fluency. Pt to trial reading aloud to optimize attention and recall of read material.   09/23/22: Pt and wife and SLP talked about pt's cognitive deficits - pt and wife agreed pt's overall processing is slower than before CVA. SLP could initiate (or complete) CLQT with pt next session. Dewitt also endorses some deficits in higher level attention ("I just have to do one thing and then do the other thing - I can't do them together anymore."). With extra time allowed, pt showed good (not excellent) insight what he might need to tell his supervisor if he were told to return to work this week. During this discussion pt indicated he was more tired now than pre-CVA. 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 SLP continued the Reading Comprehenstion Battery of Aphasia - 2nd Ed. (RCBA-2); SLP-Agam Davenport to complete the last subtest the week of 10-04-22.  09/14/22: SLP initiated Reading Comprehenstion Battery of Aphasia - 2nd Ed. (RCBA-2). 8/10 subtests completed.   Pt with incr'd time necessary for all subtests. Single words - choice of f:3 words to match to pic took approx 13 seconds per picture. SLP to cont assessment next session. SLP told pt to continue to read at home - pt states he is getting good practice reading text messages.   09/08/22: Suggested home tasks for pt and wife  PATIENT EDUCATION: Education details: Results of eval, therapy course, home tasks Person educated: Patient and Spouse Education method: Explanation, Demonstration, and Verbal cues Education comprehension: verbalized understanding, returned demonstration, verbal cues required, and needs further education   GOALS: Goals reviewed with patient?  No  SHORT TERM GOALS: Target date: 10/14/22  Pt will read 8-10 word sentence selections and indicate correct answers to written or spoken questions 100% success with extra time in 3 sessions Baseline: Goal status: MET  2.  Pt will demo understanding of mod complex emails of 10+ sentences with 100% success with compensations, in three sessions Baseline:  Goal status: MET  3.  Pt will write semi-technical 10+ sentence (work-like) messages and/or text messages with 100% success in 3 sessions Baseline:  Goal status: NOT MET - in LTGs  4.  Pt will undergo cognitive linguistic  testing if clinically necessary Baseline:  Goal status: DEFERRED   LONG TERM GOALS: Target date: 11/16/22  Pt will have higher/better score on PROM compared to initial reading Baseline:  Goal status: IN PROGRESS  2.  Pt will read 15+ sentence mod complex selections with compensations in 3 sessions Baseline: 10/15/22 Goal status: IN PROGRESS  3.  Pt will write semi-technical 8+ sentence (work-like) messages and/or text messages with 100% success in 3 sessions Baseline:  Goal status: REVISED (from STGs)  4.  Pt will write 12-sentence selections (work like emails, texts, etc)  with compensations in 3 sessions Baseline:  Goal status: REVISED   ASSESSMENT:  CLINICAL IMPRESSION: Patient is a 57 y.o. male who was seen today for treatment of cognitive linguistics and with reading in light of CVAs in March. See "today's treatment" section for additional details. Pt's OT stated pt demonstrated cognitive deficits such as processing/mental calculation, and memory. Decided SLP will focus on reading and attention in reading, with OT targeting general cognition. 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    OBJECTIVE IMPAIRMENTS: include memory, expressive language, and aphasia. These impairments are limiting patient from return to work, ADLs/IADLs, and  effectively communicating at home and in community. Factors affecting potential to achieve goals and functional outcome are ability to learn/carryover information. Patient will benefit from skilled SLP services to address above impairments and improve overall function.  REHAB POTENTIAL: Good  PLAN:  SLP FREQUENCY: 2x/week  SLP DURATION: 8 weeks  PLANNED INTERVENTIONS: Language facilitation, Environmental controls, Cueing hierachy, Cognitive reorganization, Internal/external aids, Functional tasks, Multimodal communication approach, SLP instruction and feedback, Compensatory strategies, and Patient/family education    [facility], CCC-SLP 10/13/2022, 9:58 AM

## 2022-10-18 ENCOUNTER — Ambulatory Visit (HOSPITAL_COMMUNITY)
Admission: RE | Admit: 2022-10-18 | Discharge: 2022-10-18 | Disposition: A | Discharge status: Home / Self Care | Payer: No Typology Code available for payment source | Source: Ambulatory Visit | Attending: Cardiology | Admitting: Cardiology

## 2022-10-18 ENCOUNTER — Encounter: Payer: Self-pay | Admitting: Cardiology

## 2022-10-18 ENCOUNTER — Telehealth: Payer: Self-pay

## 2022-10-18 DIAGNOSIS — I429 Cardiomyopathy, unspecified: Secondary | ICD-10-CM

## 2022-10-18 LAB — BASIC METABOLIC PANEL
Anion gap: 9 (ref 5–15)
BUN: 27 mg/dL — ABNORMAL HIGH (ref 6–20)
CO2: 28 mmol/L (ref 22–32)
Calcium: 9.6 mg/dL (ref 8.9–10.3)
Chloride: 101 mmol/L (ref 98–111)
Creatinine, Ser: 1.13 mg/dL (ref 0.61–1.24)
GFR, Estimated: >60 mL/min (ref >60)
Glucose, Bld: 99 mg/dL (ref 70–99)
Potassium: 5.2 mmol/L — ABNORMAL HIGH (ref 3.5–5.1)
Sodium: 138 mmol/L (ref 135–145)

## 2022-10-18 NOTE — Telephone Encounter (Signed)
Due to changes in the Lab we are having to reschedule pt's procedure. I offered several dates/times but none of them worked for them.   I advised her that I would see if I could rearrange some things and figure out a day that works for them. She is aware that I will call her back tomorrow.

## 2022-10-19 ENCOUNTER — Ambulatory Visit: Payer: No Typology Code available for payment source

## 2022-10-19 ENCOUNTER — Ambulatory Visit: Payer: No Typology Code available for payment source | Attending: Family Medicine | Admitting: Occupational Therapy

## 2022-10-19 DIAGNOSIS — R41841 Cognitive communication deficit: Secondary | ICD-10-CM | POA: Diagnosis present

## 2022-10-19 DIAGNOSIS — I69318 Other symptoms and signs involving cognitive functions following cerebral infarction: Secondary | ICD-10-CM | POA: Diagnosis present

## 2022-10-19 DIAGNOSIS — R41840 Attention and concentration deficit: Secondary | ICD-10-CM | POA: Insufficient documentation

## 2022-10-19 DIAGNOSIS — R480 Dyslexia and alexia: Secondary | ICD-10-CM | POA: Diagnosis present

## 2022-10-19 DIAGNOSIS — R4701 Aphasia: Secondary | ICD-10-CM | POA: Insufficient documentation

## 2022-10-19 NOTE — Telephone Encounter (Signed)
Pt has been rescheduled to 11/08/22...  He will have labs drawn on 6/5.  Updated Instruction letter has been sent via MyChart.

## 2022-10-19 NOTE — Therapy (Signed)
OUTPATIENT OCCUPATIONAL THERAPY NEURO Treatment Note  Patient Name: Luke Jimenez MRN: [id number] DOB:1966-04-16, 57 y.o., male Today's Date: 10/19/2022  PCP: Shon Hale, MD REFERRING PROVIDER: Meredeth Ide, MD  END OF SESSION:  OT End of Session - 10/19/22 0958     Visit Number 9    Number of Visits 13    Date for OT Re-Evaluation 11/05/22    Authorization Type [employer]    OT Start Time 0934    OT Stop Time 1018    OT Time Calculation (min) 44 min                    Past Medical History:  Diagnosis Date   Bradycardia    Second degree AV block    Stroke [facility])    Past Surgical History:  Procedure Laterality Date   BUBBLE STUDY  08/13/2022   Procedure: BUBBLE STUDY;  Surgeon: Christell Constant, MD;  Location: [facility] ENDOSCOPY;  Service: Cardiovascular;;   PACEMAKER IMPLANT N/A 12/20/2019   Procedure: PACEMAKER IMPLANT;  Surgeon: Hillis Range, MD;  Location: [facility] INVASIVE CV LAB;  Service: Cardiovascular;  Laterality: N/A;   RIGHT/LEFT HEART CATH AND CORONARY ANGIOGRAPHY N/A 09/28/2022   Procedure: RIGHT/LEFT HEART CATH AND CORONARY ANGIOGRAPHY;  Surgeon: Laurey Morale, MD;  Location: [facility] INVASIVE CV LAB;  Service: Cardiovascular;  Laterality: N/A;   TEE WITHOUT CARDIOVERSION N/A 08/13/2022   Procedure: TRANSESOPHAGEAL ECHOCARDIOGRAM (TEE);  Surgeon: Christell Constant, MD;  Location: [facility] ENDOSCOPY;  Service: Cardiovascular;  Laterality: N/A;   Patient Active Problem List   Diagnosis Date Noted   Heart failure, type unknown (HCC) 09/06/2022   Cardiomyopathy (HCC) 08/13/2022   Cerebrovascular accident (CVA) due to embolism of precerebral artery (HCC) 08/13/2022   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) 08/11/2022   Cryptogenic stroke (HCC) 08/11/2022   Seizure (HCC) 08/10/2022   AMS (altered mental status) 08/09/2022   ATRIOVENTRICULAR BLOCK, 2ND DEGREE 06/17/2010    ONSET DATE: 08/09/22  REFERRING DIAG: I63.10  (ICD-10-CM) - Cerebrovascular accident (CVA) due to embolism of precerebral artery  THERAPY DIAG:  Attention and concentration deficit  Other symptoms and signs involving cognitive functions following cerebral infarctionLama, Sarina Ill, MD  Rationale for Evaluation and Treatment: Rehabilitation  SUBJECTIVE:   SUBJECTIVE STATEMENT: Pt reports having son at the house over the weekend, who did some cooking for them. Pt accompanied by: self and significant other (spouse Luke Jimenez)  PERTINENT HISTORY: Pt is a 57 y.o. male who presented 08/09/22 with speech difficulty and AMS. 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 EEG also showing seizures. PMH: second-degree AV block with pacemaker, bradycardia  PRECAUTIONS: None  WEIGHT BEARING RESTRICTIONS: No  PAIN:  Are you having pain? No  FALLS: Has patient fallen in last 6 months? No  LIVING ENVIRONMENT: Lives with: lives with their spouse and family nearby Lives in: House/apartment Stairs: Yes: Internal: bedroom/bathroom up full flight of steps; and External: 4-5 steps Has following equipment at home: Grab bars and built in shower seat, hand held shower head  PLOF: Independent, Independent with basic ADLs, and Vocation/Vocational requirements: Quarry manager   PATIENT GOALS: to improve in reading and short term memory  OBJECTIVE:   HAND DOMINANCE: Right  IADLs: Shopping: has gone to store with spouse, with ability to walk away to retreive an item and return to her  Meal Prep: microwave to warm up leftovers, but did not really cook before [employer] mobility: not cleared  to drive Medication management: spouse is dispensing  Financial management: spouse takes care of and did before  COGNITION: Overall cognitive status:  Impaired with significant impairments to time orientation, sequencing, attention, and short term memory/recall  VISION: Subjective report: no changes  VISION  ASSESSMENT: To be further assessed in functional context While in hospital, OT noted questionable R inattention, to be further assessed in subsequent visits  Patient has difficulty with following activities due to following visual impairments: reading - unsure if attention, memory, or visual  TODAY'S TREATMENT:                                    10/19/22 Dual tasking: engaged in education on dual tasking with focus on increased awareness of importance of honing one task and then adding distractions as appropriate to further challenge attention.  Pt continues to demonstrate selective and intermittent alternating attention at this point. Memory: reviewed routine tasks where pt continues to struggle with recall, particularly when it comes to location of household items.  Pt reports that pt would aid in laundry tasks prior to CVA and now will ask where particular towels go as he does not recall location of certain items.  OT educated on importance of attention to task during completion of task to attempt to promote carryover of information.   List making: OT challenged pt to make a list of items needed to pack for upcoming trip.  Reviewed use of list making as memory strategy and ability to update and modify list as he goes.  Encouraged pt to begin his list for trip this week to allow for time to recall forgotten items.  Pt demonstrating increased spelling errors when attempting to complete with alternating attention vs sustained/selective attention.    10/15/22 Therapeutic activity: engaged in [employer] hour" activity requiring pt to replicate pattern of cars in grid challenging visual sequencing, memory, and problem solving. Pt requiring increased time when setting up  - however without cues other than pattern. Educated on functional carryover as it pertains to sequencing and organization to aid in memory. Memory: engaged in recall activity while ambulating outside to facilitate cognitive/motor dual tasking.   Pt challenged to recall 5 words (of items that would be passed during walk).  Pt able to recall 3/5 after ~5 mins.  OT reiterated use of memory strategies with categorizing items, writing items to increase.    10/13/22 Memory: reports feeling like this hit "where I was weaker at".  Filled pill box over the weekend, utilizing a spreadsheet to organize meds to aid pt in recall and dispensing meds appropriately.  Pt reports organizing pills by AM, PM, or both.  Reports frequently thinking "How can I make this better or simpler." Plans to fill/organize in alphabetical order next time to aid in recall as utilizing med list which is in alphabetical order.   Planning/Organizing: engaged in complex planning/problem solving prompt requiring pt to organize items into systematic schedule to complete all in time allotted. Pt utilizing underlining of key words and writing on lined paper, leaving spaces to allow space to fill in additional information/stops as needed.  Reviewed completing digitally allowing drag and drop options. Discussed pacing and prioritizing activities to ensure enough energy to complete tasks.  Discussed use of online ordering, curb side pickup, and use of digital resources to assist as needed. Pt requiring increased time to organize and sequence stops.    PATIENT EDUCATION:  Education details: ongoing condition specific education and use of pictures or grid to aid in sequencing Person educated: Patient and Spouse Education method: Explanation Education comprehension: verbalized understanding and needs further education  HOME EXERCISE PROGRAM: TBD   GOALS: Goals reviewed with patient? Yes  SHORT TERM GOALS: Target date: 10/08/22  Pt will verbalize understanding of task modifications and/or potential A/E needs to increase ease, safety, and independence w/ ADLs and IADLs. Baseline:  Goal status: MET - 10/07/22  2.  Pt will be able to complete simulated visual scanning activity w/ at  least 95% accuracy (finding targets around therapy gym, searching for items on shelves), incorporating visual and memory compensatory strategies prn Baseline:  Goal status: IN PROGRESS  3.  Pt will demonstrate improved memory, sequencing, and problem solving as demonstrated by improvements in score on pill box assessment by ability to complete in 5 mins time limit with </= to 3 errors. Baseline:  Goal status: IN PROGRESS  4.   Pt will complete table top scanning activity with Supervision, min cues for recall of instructions, sequencing during task. Baseline:  Goal status: MET - 10/05/22  5.   Pt will be able to demonstrate ability to alternate attention for 5 mins during table top task. Baseline:  Goal status: NOT MET - 10/07/22  LONG TERM GOALS: Target date: 11/05/22  Pt will demonstrate ability to sequence simple functional task (simple snack prep, laundry task, etc) at Mod I level with good safety awareness and ability to utilize memory strategies for recall. Baseline:  Goal status: IN PROGRESS  2.  Pt will navigate a moderately busy environment, completing dual task activity and/or following multi-step commands with 90% accuracy Baseline:  Goal status: IN PROGRESS  3.  Pt will complete simulated medication management activity w/ Mod Ind by discharge, incorporating compensatory strategies/AE prn Baseline:  Goal status: IN PROGRESS  4.  Pt will be independent with utilization of memory strategies to allow for increased independence with managing personal appointments. Baseline:  Goal status: IN PROGRESS  5.  Pt will be able to demonstrate ability to divide attention for 5 mins during functional task. Baseline:  Goal status: IN PROGRESS   ASSESSMENT:  CLINICAL IMPRESSION: Pt continues to report difficulty with dual tasking as reports that humans are "one processor beings" and do not truly divide attention.  OT educated on tasks where divided or dual tasking occurs and areas  where pt has been successful during previous sessions vs challenges.  Encouraged list making as memory strategy and to incorporate with daily tasks and especially when preparing for a trip to facilitate increased recall, especially with items such as medication.  OT encouraged pt and spouse to discuss "holes" in typical routines and report back to facilitate further sessions and how to assist with recall and sequencing with routine tasks.  PERFORMANCE DEFICITS: in functional skills including IADLs, endurance, cardiopulmonary status limiting function, decreased knowledge of precautions, and decreased knowledge of use of DME, cognitive skills including attention, memory, orientation, problem solving, and sequencing, and psychosocial skills including coping strategies, environmental adaptation, and routines and behaviors.   IMPAIRMENTS: are limiting patient from IADLs, work, and social participation.   CO-MORBIDITIES: may have co-morbidities  that affects occupational performance. Patient will benefit from skilled OT to address above impairments and improve overall function.  MODIFICATION OR ASSISTANCE TO COMPLETE EVALUATION: Min-Moderate modification of tasks or assist with assess necessary to complete an evaluation.  OT OCCUPATIONAL PROFILE AND HISTORY: Problem focused assessment: Including review of records  relating to presenting problem.  CLINICAL DECISION MAKING: Moderate - several treatment options, min-mod task modification necessary  REHAB POTENTIAL: Good  EVALUATION COMPLEXITY: Moderate    PLAN:  OT FREQUENCY: 1-2x/week  OT DURATION: 8 weeks  PLANNED INTERVENTIONS: self care/ADL training, therapeutic activity, neuromuscular re-education, functional mobility training, patient/family education, cognitive remediation/compensation, visual/perceptual remediation/compensation, psychosocial skills training, energy conservation, and coping strategies training  RECOMMENDED OTHER SERVICES:  SLP   CONSULTED AND AGREED WITH PLAN OF CARE: Patient and family member/caregiver  PLAN FOR NEXT SESSION: alternating attention, educate on memory strategies (checks and balances, checking system).  Complete organization of day activity and scavenger hunt with use of memory strategies.   Allyson Tineo, OTR/L 10/19/2022, 12:00 PM

## 2022-10-20 ENCOUNTER — Ambulatory Visit: Payer: No Typology Code available for payment source | Attending: Cardiovascular Disease

## 2022-10-20 LAB — CBC WITH DIFFERENTIAL/PLATELET

## 2022-10-20 LAB — BASIC METABOLIC PANEL
Calcium: 9.4 mg/dL (ref 8.7–10.2)
Chloride: 100 mmol/L (ref 96–106)
eGFR: 75 mL/min/{1.73_m2} (ref >59)

## 2022-10-20 NOTE — Therapy (Unsigned)
OUTPATIENT SPEECH LANGUAGE PATHOLOGY TREATMENT   Patient Name: Marguis Durrer MRN: [id number] DOB:04-Nov-1965, 57 y.o., male Today's Date: 10/21/2022  PCP: Shon Hale MD (Documentation) REFERRING PROVIDER: Meredeth Ide, MD  END OF SESSION:  End of Session - 10/21/22 0842     Visit Number 10    Number of Visits 17    Date for SLP Re-Evaluation 11/17/22    SLP Start Time 0845    SLP Stop Time  0930    SLP Time Calculation (min) 45 min    Activity Tolerance Patient tolerated treatment well                 Past Medical History:  Diagnosis Date   Bradycardia    Second degree AV block    Stroke [facility])    Past Surgical History:  Procedure Laterality Date   BUBBLE STUDY  08/13/2022   Procedure: BUBBLE STUDY;  Surgeon: Christell Constant, MD;  Location: [facility] ENDOSCOPY;  Service: Cardiovascular;;   PACEMAKER IMPLANT N/A 12/20/2019   Procedure: PACEMAKER IMPLANT;  Surgeon: Hillis Range, MD;  Location: [facility] INVASIVE CV LAB;  Service: Cardiovascular;  Laterality: N/A;   RIGHT/LEFT HEART CATH AND CORONARY ANGIOGRAPHY N/A 09/28/2022   Procedure: RIGHT/LEFT HEART CATH AND CORONARY ANGIOGRAPHY;  Surgeon: Laurey Morale, MD;  Location: [facility] INVASIVE CV LAB;  Service: Cardiovascular;  Laterality: N/A;   TEE WITHOUT CARDIOVERSION N/A 08/13/2022   Procedure: TRANSESOPHAGEAL ECHOCARDIOGRAM (TEE);  Surgeon: Christell Constant, MD;  Location: [facility] ENDOSCOPY;  Service: Cardiovascular;  Laterality: N/A;   Patient Active Problem List   Diagnosis Date Noted   Heart failure, type unknown (HCC) 09/06/2022   Cardiomyopathy (HCC) 08/13/2022   Cerebrovascular accident (CVA) due to embolism of precerebral artery (HCC) 08/13/2022   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 male who presented to ED with complaints of nonspecific complaints of feeling off Pt family concerned that he was having difficulty with speaking over the phone. Also was having difficulty finding words with forgetfulness. Pt had slight headache yesterday but no other symptoms. CT head (08/09/22) showed hypodensity in the left temporal lobe suspicious for acute infarct. MRI brain (08/11/22) revealed "Acute left PCA territory infarcts". EEG (08/12/22) "suggestive of cortical dysfunction arising from left temporal region likely secondary to underlying stroke, post-ictal state. No further seizures were noted". PMH: second-degree AV block with pacemaker  PAIN: Are you having pain? No  FALLS: Has patient fallen in last 6 months?  No  PATIENT GOALS: Improve reading to be able to return to work  OBJECTIVE:   DIAGNOSTIC FINDINGS:  SLE 08/12/22: Assessment / Plan / Recommendation Clinical Impression     Pt presents with primarily cognitive communication deficits, with very few word finding difficulites and likely visual recognition deficits. He is oriented fully to self and place with difficulty recalling year (1994) and reason for hospitalization. Immediate recall of listed items and perfomance with simple mathematical calculation were [facility]. Cognitive deificts  noted were in the areas of delayed recall (2/5 items recalled), complex verbal problem solving and executive function (inferencing, self-monitoring/correcting). During informal conversation, no word finding or expressive language difficulties noted.  Perseveration/hesistation exhibited during confrontational naming task in x1 instance. Receptive language appeared [facility] for tasks provided. During reading (words/sentences) and picture scene task, pt with difficulty recognizing/discriminating few pictured objects/words. Some hesitations evident during reading task and pt reports no issues with visual acuity, but with some delay in recognizing word. Would need to r/o any other visual deficits to determine if solely a recognition/discrimination issue. Recommend SLP services f/u acutely to treat primarily for cognitive communication function. Ongoing assessment/ dx treatment for language function also warranted. Wife present in room/via phone for majority of session is in agreement with recommendations.       PATIENT REPORTED OUTCOME MEASURES (PROM): To be completed week of 10/11/22.    TODAY'S TREATMENT:                                                                                                                                         DATE:  10/21/22: Reports slow progress with ongoing reduced processing speed, decreased short term recall, and ongoing reading challenges, although some improvement reported. Continues reading aloud as part of HEP. Indicated increased difficulty understanding context when reading aloud and decoding multi-syllabic words if not spelled phonetically. Educated and instructed reading strategies to aid lexical contexts, such as reading silently for context prior to reading aloud, writing down errored words and re-reading, and using self-talk to slow thought processing down and aid attention (spelling aloud, saying next step). Pt completed unscrambling words/sentences task with mod I given task naturally slowed his rate and provided context (underlined or capitalized first letter). Provided as part of HEP. Pt and wife expressed appreciation for recommendations and SLP team providing multiple perspectives.   10/15/22: Since last  session, pt kept his place with lining up the digital text with the top line and this has been successful as a place marker to incr attention. Pt has not used "speak" function on the iPhone yet. Slp told pt to get some Cobal text over the weekend and see if he can understand it. In multiple paragraph selections, pt took extra time to read and answered questions 100% with looking back at text 20% of the time.  10/13/22: Pt stated his reading was improving and now he is only having difficulty with long/er words.SLP educated pt on speak function with iOS today with text messages. Today pt read a 7 sentence text message to SLP with mild and rare hesitation/pausing. Pt stated, "It seems sometime I'm missing the first letters of the words" and SLP told pt it appears that this would be a sx of decr'd attention to task when reading and reminded pt of compensatory strategies of using his finger or a straightedge to support his attention when reading.  10/07/22: SLP  to provide pt with PROM next session. SLP worked with pt reading using his memory strategy handout rec'd in OT. Pt with hesitation with multisyllable words and SLP had pt implement technique discussed last session. Pt and wife had not had time to work on reading due to family plans the previous two days. Pt made errors reading on functor words and req'd SLP cues for awareness.   10/04/21: SLP suggested pt and wife do more reading homework with the suggestions by SLP implemented (see pt instructions). Pt and wife will complete reading homework together for 25-30 minutes, x5 days/week.  SLP asked pt to think about how these deficits would impact him at work and pt stated he would need to write more notes for recall than he did prior to CVA. Pt does not know how reading deficit will affect him at work due to [employer] being another language, with other context cues.  09/30/22: Further targeted and analyzed reading today. Pt endorsed some reduced word recognition,  particularly with consonant clusters and multisyllabic words. Targeted oral reading of Rainbow passage. SLP noted intermittent delays with more common sight words (ex: white, beautiful) as well as possible phonemic paraphasias (ex: when/then, universal/unusual, bright/bridge). Only self-corrected error x1. Encouraged metacognitive analysis of reading ability, in which pt identified not attending to entire word resulting in errors. Recommended visual supports (finger, visual marker) to aid attention and processing speed. Targeted minimal pairs, in which pt required some extended time and occasional mod A to correct errors. Recommended functional reading at home to aid word recognition.   09/27/22: Addressed reading with use of compensations to aid in attention. Pt benefiting from oral reading, aiding in recall of x7 pertinent details from paragraph level stimuli, in spite of occasional disruption in reading fluency. Provided education on use of text to speech settings on apple products to aid in reading practice. Recommend reading with support, then returning and re-reading IND to aid in increased fluency. Pt to trial reading aloud to optimize attention and recall of read material.   09/23/22: Pt and wife and SLP talked about pt's cognitive deficits - pt and wife agreed pt's overall processing is slower than before CVA. SLP could initiate (or complete) CLQT with pt next session. Zarif also endorses some deficits in higher level attention ("I just have to do one thing and then do the other thing - I can't do them together anymore."). With extra time allowed, pt showed good (not excellent) insight what he might need to tell his supervisor if he were told to return to work this week. During this discussion pt indicated he was more tired now than pre-CVA. 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 SLP continued the Reading Comprehenstion  Battery of Aphasia - 2nd Ed. (RCBA-2); SLP-Schinke to complete the last subtest the week of 10-04-22.  09/14/22: SLP initiated Reading Comprehenstion Battery of Aphasia - 2nd Ed. (RCBA-2). 8/10 subtests completed.   Pt with incr'd time necessary for all subtests. Single words - choice of f:3 words to match to pic took approx 13 seconds per picture. SLP to cont assessment next session. SLP told pt to continue to read at home - pt states he is getting good practice reading text messages.   09/08/22: Suggested home tasks for pt and wife  PATIENT EDUCATION: Education details: Results of eval, therapy course, home tasks Person educated: Patient and Spouse Education method: Explanation, Demonstration, and Verbal cues Education  comprehension: verbalized understanding, returned demonstration, verbal cues required, and needs further education   GOALS: Goals reviewed with patient? No  SHORT TERM GOALS: Target date: 10/14/22  Pt will read 8-10 word sentence selections and indicate correct answers to written or spoken questions 100% success with extra time in 3 sessions Baseline: Goal status: MET  2.  Pt will demo understanding of mod complex emails of 10+ sentences with 100% success with compensations, in three sessions Baseline:  Goal status: MET  3.  Pt will write semi-technical 10+ sentence (work-like) messages and/or text messages with 100% success in 3 sessions Baseline:  Goal status: NOT MET - in LTGs  4.  Pt will undergo cognitive linguistic testing if clinically necessary Baseline:  Goal status: DEFERRED   LONG TERM GOALS: Target date: 11/16/22  Pt will have higher/better score on PROM compared to initial reading Baseline:  Goal status: IN PROGRESS  2.  Pt will read 15+ sentence mod complex selections with compensations in 3 sessions Baseline: 10/15/22 Goal status: IN PROGRESS  3.  Pt will write semi-technical 8+ sentence (work-like) messages and/or text messages with 100% success  in 3 sessions Baseline:  Goal status: REVISED (from STGs)  4.  Pt will write 12-sentence selections (work like emails, texts, etc)  with compensations in 3 sessions Baseline:  Goal status: REVISED   ASSESSMENT:  CLINICAL IMPRESSION: Patient is a 57 y.o. male who was seen today for treatment of cognitive linguistics and with reading in light of CVAs in March. See "today's treatment" section for additional details. Pt's OT stated pt demonstrated cognitive deficits such as processing/mental calculation, and memory. Decided SLP will focus on reading and attention in reading, with OT targeting general cognition. 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    OBJECTIVE IMPAIRMENTS: include memory, expressive language, and aphasia. These impairments are limiting patient from return to work, ADLs/IADLs, and effectively communicating at home and in community. Factors affecting potential to achieve goals and functional outcome are ability to learn/carryover information. Patient will benefit from skilled SLP services to address above impairments and improve overall function.  REHAB POTENTIAL: Good  PLAN:  SLP FREQUENCY: 2x/week  SLP DURATION: 8 weeks  PLANNED INTERVENTIONS: Language facilitation, Environmental controls, Cueing hierachy, Cognitive reorganization, Internal/external aids, Functional tasks, Multimodal communication approach, SLP instruction and feedback, Compensatory strategies, and Patient/family education    Gracy Racer, CCC-SLP 10/21/2022, 8:42 AM

## 2022-10-20 NOTE — Progress Notes (Signed)
[facility] Summary Report / Loop Recorder 

## 2022-10-21 ENCOUNTER — Ambulatory Visit: Payer: No Typology Code available for payment source | Admitting: Occupational Therapy

## 2022-10-21 ENCOUNTER — Ambulatory Visit: Payer: No Typology Code available for payment source

## 2022-10-21 DIAGNOSIS — R41840 Attention and concentration deficit: Secondary | ICD-10-CM

## 2022-10-21 DIAGNOSIS — I69318 Other symptoms and signs involving cognitive functions following cerebral infarction: Secondary | ICD-10-CM

## 2022-10-21 DIAGNOSIS — R41841 Cognitive communication deficit: Secondary | ICD-10-CM

## 2022-10-21 DIAGNOSIS — R480 Dyslexia and alexia: Secondary | ICD-10-CM

## 2022-10-21 LAB — CBC WITH DIFFERENTIAL/PLATELET
Basophils Absolute: 0 10*3/uL (ref 0.0–0.2)
Basos: 1 %
EOS (ABSOLUTE): 0.1 10*3/uL (ref 0.0–0.4)
Eos: 2 %
Hematocrit: 49.5 % (ref 37.5–51.0)
Hemoglobin: 16.9 g/dL (ref 13.0–17.7)
Immature Grans (Abs): 0 10*3/uL (ref 0.0–0.1)
Lymphocytes Absolute: 1.5 10*3/uL (ref 0.7–3.1)
Lymphs: 24 %
MCHC: 34.1 g/dL (ref 31.5–35.7)
MCV: 96 fL (ref 79–97)
Monocytes: 11 %
Neutrophils Absolute: 4 10*3/uL (ref 1.4–7.0)
Neutrophils: 62 %
Platelets: 259 10*3/uL (ref 150–450)
RBC: 5.16 x10E6/uL (ref 4.14–5.80)
RDW: 12.4 % (ref 11.6–15.4)
WBC: 6.3 10*3/uL (ref 3.4–10.8)

## 2022-10-21 LAB — BASIC METABOLIC PANEL
BUN/Creatinine Ratio: 19 (ref 9–20)
BUN: 22 mg/dL (ref 6–24)
CO2: 24 mmol/L (ref 20–29)
Creatinine, Ser: 1.13 mg/dL (ref 0.76–1.27)
Glucose: 90 mg/dL (ref 70–99)
Potassium: 5.1 mmol/L (ref 3.5–5.2)
Sodium: 137 mmol/L (ref 134–144)

## 2022-10-21 NOTE — Therapy (Signed)
OUTPATIENT OCCUPATIONAL THERAPY NEURO Treatment Note  Patient Name: Luke Jimenez MRN: [id number] DOB:1966/03/07, 57 y.o., male Today's Date: 10/21/2022  PCP: Shon Hale, MD REFERRING PROVIDER: Meredeth Ide, MD  END OF SESSION:  OT End of Session - 10/21/22 0946     Visit Number 10    Number of Visits 13    Date for OT Re-Evaluation 11/05/22    Authorization Type [employer] [employer]    OT Start Time [phone]   therapist late arrival/prior pt ran long   OT Stop Time 1017    OT Time Calculation (min) 35 min                     Past Medical History:  Diagnosis Date   Bradycardia    Second degree AV block    Stroke [facility])    Past Surgical History:  Procedure Laterality Date   BUBBLE STUDY  08/13/2022   Procedure: BUBBLE STUDY;  Surgeon: Christell Constant, MD;  Location: [facility] ENDOSCOPY;  Service: Cardiovascular;;   PACEMAKER IMPLANT N/A 12/20/2019   Procedure: PACEMAKER IMPLANT;  Surgeon: Hillis Range, MD;  Location: [facility] INVASIVE CV LAB;  Service: Cardiovascular;  Laterality: N/A;   RIGHT/LEFT HEART CATH AND CORONARY ANGIOGRAPHY N/A 09/28/2022   Procedure: RIGHT/LEFT HEART CATH AND CORONARY ANGIOGRAPHY;  Surgeon: Laurey Morale, MD;  Location: [facility] INVASIVE CV LAB;  Service: Cardiovascular;  Laterality: N/A;   TEE WITHOUT CARDIOVERSION N/A 08/13/2022   Procedure: TRANSESOPHAGEAL ECHOCARDIOGRAM (TEE);  Surgeon: Christell Constant, MD;  Location: [facility] ENDOSCOPY;  Service: Cardiovascular;  Laterality: N/A;   Patient Active Problem List   Diagnosis Date Noted   Heart failure, type unknown (HCC) 09/06/2022   Cardiomyopathy (HCC) 08/13/2022   Cerebrovascular accident (CVA) due to embolism of precerebral artery (HCC) 08/13/2022   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) 08/11/2022   Cryptogenic stroke (HCC) 08/11/2022   Seizure (HCC) 08/10/2022   AMS (altered mental status) 08/09/2022   ATRIOVENTRICULAR BLOCK, 2ND DEGREE 06/17/2010     ONSET DATE: 08/09/22  REFERRING DIAG: I63.10 (ICD-10-CM) - Cerebrovascular accident (CVA) due to embolism of precerebral artery  THERAPY DIAG:  Attention and concentration deficit  Other symptoms and signs involving cognitive functions following cerebral infarctionLama, Sarina Ill, MD  Rationale for Evaluation and Treatment: Rehabilitation  SUBJECTIVE:   SUBJECTIVE STATEMENT: Pt's spouse reports dual tasking/multi-tasking is "just not happening". Pt and spouse report noticing that he is trying to go faster than his brain is allowing him. Pt accompanied by: self and significant other (spouse Luke Jimenez)  PERTINENT HISTORY: Pt is a 58 y.o. male who presented 08/09/22 with speech difficulty and AMS. 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 EEG also showing seizures. PMH: second-degree AV block with pacemaker, bradycardia  PRECAUTIONS: None  WEIGHT BEARING RESTRICTIONS: No  PAIN:  Are you having pain? No  FALLS: Has patient fallen in last 6 months? No  LIVING ENVIRONMENT: Lives with: lives with their spouse and family nearby Lives in: House/apartment Stairs: Yes: Internal: bedroom/bathroom up full flight of steps; and External: 4-5 steps Has following equipment at home: Grab bars and built in shower seat, hand held shower head  PLOF: Independent, Independent with basic ADLs, and Vocation/Vocational requirements: Quarry manager   PATIENT GOALS: to improve in reading and short term memory  OBJECTIVE:   HAND DOMINANCE: Right  IADLs: Shopping: has gone to store with spouse, with ability to walk away to retreive an item and return to  her  Meal Prep: microwave to warm up leftovers, but did not really cook before Community mobility: not cleared to drive Medication management: spouse is Control and instrumentation engineer: spouse takes care of and did before  COGNITION: Overall cognitive status:  Impaired with significant  impairments to time orientation, sequencing, attention, and short term memory/recall  VISION: Subjective report: no changes  VISION ASSESSMENT: To be further assessed in functional context While in hospital, OT noted questionable R inattention, to be further assessed in subsequent visits  Patient has difficulty with following activities due to following visual impairments: reading - unsure if attention, memory, or visual  TODAY'S TREATMENT:                                    10/21/22 Memory: Pt's spouse reports that pt continues to ask same questions repeatedly during day/week and/or repeating himself with decreased or no recollection.  OT educated on recommendation to stop what you are doing to register the response and even repeat the response/write it down to increase carryover/recall.  Educated on importance of engagement and sustained attention when asking and receiving response.  Discussed compensatory strategies of writing things down, utilizing voice apps to allow quicker exchange.  Reiterated education on being present and engaged with task and decreasing external stimuli to facilitate increased carryover/recall of information. Awareness: Pt is demonstrating increased recognition of situations, (e.g. Pt reports that he will be heading somewhere to address something and 2nd thought will come in to where he either forgets first thought or will forget 2nd if he continues on with 1st) whereas he would not even recall situation at all.  Reiterated compensatory strategies to facilitate increased awareness/attention with decreasing external stimuli.  Pt reports that he has noticed a slowing down of his speed with tasks such as reading, allowing for increased processing or recognition with reading.    10/19/22 Dual tasking: engaged in education on dual tasking with focus on increased awareness of importance of honing one task and then adding distractions as appropriate to further challenge attention.   Pt continues to demonstrate selective and intermittent alternating attention at this point. Memory: reviewed routine tasks where pt continues to struggle with recall, particularly when it comes to location of household items.  Pt reports that pt would aid in laundry tasks prior to CVA and now will ask where particular towels go as he does not recall location of certain items.  OT educated on importance of attention to task during completion of task to attempt to promote carryover of information.   List making: OT challenged pt to make a list of items needed to pack for upcoming trip.  Reviewed use of list making as memory strategy and ability to update and modify list as he goes.  Encouraged pt to begin his list for trip this week to allow for time to recall forgotten items.  Pt demonstrating increased spelling errors when attempting to complete with alternating attention vs sustained/selective attention.    10/15/22 Therapeutic activity: engaged in [employer] hour" activity requiring pt to replicate pattern of cars in grid challenging visual sequencing, memory, and problem solving. Pt requiring increased time when setting up  - however without cues other than pattern. Educated on functional carryover as it pertains to sequencing and organization to aid in memory. Memory: engaged in recall activity while ambulating outside to facilitate cognitive/motor dual tasking.  Pt challenged to recall 5 words (of  items that would be passed during walk).  Pt able to recall 3/5 after ~5 mins.  OT reiterated use of memory strategies with categorizing items, writing items to increase.    PATIENT EDUCATION: Education details: ongoing condition specific education and use of lists and/or voice notes on phone for recall Person educated: Patient and Spouse Education method: Explanation Education comprehension: verbalized understanding and needs further education  HOME EXERCISE PROGRAM: TBD   GOALS: Goals reviewed with  patient? Yes  SHORT TERM GOALS: Target date: 10/08/22  Pt will verbalize understanding of task modifications and/or potential A/E needs to increase ease, safety, and independence w/ ADLs and IADLs. Baseline:  Goal status: MET - 10/07/22  2.  Pt will be able to complete simulated visual scanning activity w/ at least 95% accuracy (finding targets around therapy gym, searching for items on shelves), incorporating visual and memory compensatory strategies prn Baseline:  Goal status: IN PROGRESS  3.  Pt will demonstrate improved memory, sequencing, and problem solving as demonstrated by improvements in score on pill box assessment by ability to complete in 5 mins time limit with </= to 3 errors. Baseline:  Goal status: IN PROGRESS  4.   Pt will complete table top scanning activity with Supervision, min cues for recall of instructions, sequencing during task. Baseline:  Goal status: MET - 10/05/22  5.   Pt will be able to demonstrate ability to alternate attention for 5 mins during table top task. Baseline:  Goal status: NOT MET - 10/07/22  LONG TERM GOALS: Target date: 11/05/22  Pt will demonstrate ability to sequence simple functional task (simple snack prep, laundry task, etc) at Mod I level with good safety awareness and ability to utilize memory strategies for recall. Baseline:  Goal status: IN PROGRESS  2.  Pt will navigate a moderately busy environment, completing dual task activity and/or following multi-step commands with 90% accuracy Baseline:  Goal status: IN PROGRESS  3.  Pt will complete simulated medication management activity w/ Mod Ind by discharge, incorporating compensatory strategies/AE prn Baseline:  Goal status: IN PROGRESS  4.  Pt will be independent with utilization of memory strategies to allow for increased independence with managing personal appointments. Baseline:  Goal status: IN PROGRESS  5.  Pt will be able to demonstrate ability to divide attention for 5  mins during functional task. Baseline:  Goal status: IN PROGRESS   ASSESSMENT:  CLINICAL IMPRESSION: Pt continues to report difficulty with dual tasking, therefore OT educated on removal of extra stimuli to allow pt to focus on one task at a time for increased mastery.  Educated on once mastery has been achieved then to add in secondary stimulus, but to expect a decrease in performance with increased challenge/stimulus.  Reiterated compensatory strategies to aid in memory, such as list making, use of voice apps, and repeating information.  Pt, spouse, and OT recommend taking a break from OT at this time as he has been proved with memory and attention strategies and is encouraged to attempt to implement a few into routine at home.  Pt has upcoming travel, where OT encouraged pt and spouse to assess presence of  "holes" recognized during travel and in new environment.  Recommend hold for ~30 days to allow for pt to incorporate strategies and return for re-eval PRN.   PERFORMANCE DEFICITS: in functional skills including IADLs, endurance, cardiopulmonary status limiting function, decreased knowledge of precautions, and decreased knowledge of use of DME, cognitive skills including attention, memory, orientation, problem solving,  and sequencing, and psychosocial skills including coping strategies, environmental adaptation, and routines and behaviors.   IMPAIRMENTS: are limiting patient from IADLs, work, and social participation.   CO-MORBIDITIES: may have co-morbidities  that affects occupational performance. Patient will benefit from skilled OT to address above impairments and improve overall function.  MODIFICATION OR ASSISTANCE TO COMPLETE EVALUATION: Min-Moderate modification of tasks or assist with assess necessary to complete an evaluation.  OT OCCUPATIONAL PROFILE AND HISTORY: Problem focused assessment: Including review of records relating to presenting problem.  CLINICAL DECISION MAKING:  Moderate - several treatment options, min-mod task modification necessary  REHAB POTENTIAL: Good  EVALUATION COMPLEXITY: Moderate    PLAN:  OT FREQUENCY: 1-2x/week  OT DURATION: 8 weeks  PLANNED INTERVENTIONS: self care/ADL training, therapeutic activity, neuromuscular re-education, functional mobility training, patient/family education, cognitive remediation/compensation, visual/perceptual remediation/compensation, psychosocial skills training, energy conservation, and coping strategies training  RECOMMENDED OTHER SERVICES: SLP   CONSULTED AND AGREED WITH PLAN OF CARE: Patient and family member/caregiver  PLAN FOR NEXT SESSION: Re-eval and re-cert with new focus PRN.     Rosalio Loud, OTR/L 10/21/2022, 9:47 AM

## 2022-10-25 ENCOUNTER — Ambulatory Visit: Payer: No Typology Code available for payment source

## 2022-10-25 ENCOUNTER — Ambulatory Visit (HOSPITAL_COMMUNITY)
Admission: RE | Admit: 2022-10-25 | Discharge: 2022-10-25 | Disposition: A | Discharge status: Home / Self Care | Payer: No Typology Code available for payment source | Source: Ambulatory Visit | Attending: Internal Medicine | Admitting: Internal Medicine

## 2022-10-25 VITALS — BP 118/78 | HR 89

## 2022-10-25 DIAGNOSIS — R4701 Aphasia: Secondary | ICD-10-CM

## 2022-10-25 DIAGNOSIS — I428 Other cardiomyopathies: Secondary | ICD-10-CM | POA: Diagnosis not present

## 2022-10-25 DIAGNOSIS — I69311 Memory deficit following cerebral infarction: Secondary | ICD-10-CM | POA: Diagnosis not present

## 2022-10-25 DIAGNOSIS — I5022 Chronic systolic (congestive) heart failure: Secondary | ICD-10-CM | POA: Diagnosis not present

## 2022-10-25 DIAGNOSIS — Z950 Presence of cardiac pacemaker: Secondary | ICD-10-CM | POA: Diagnosis not present

## 2022-10-25 DIAGNOSIS — Z79899 Other long term (current) drug therapy: Secondary | ICD-10-CM | POA: Diagnosis not present

## 2022-10-25 DIAGNOSIS — R41841 Cognitive communication deficit: Secondary | ICD-10-CM

## 2022-10-25 DIAGNOSIS — Z7901 Long term (current) use of anticoagulants: Secondary | ICD-10-CM | POA: Insufficient documentation

## 2022-10-25 DIAGNOSIS — D6851 Activated protein C resistance: Secondary | ICD-10-CM | POA: Insufficient documentation

## 2022-10-25 DIAGNOSIS — R41840 Attention and concentration deficit: Secondary | ICD-10-CM | POA: Diagnosis not present

## 2022-10-25 DIAGNOSIS — I455 Other specified heart block: Secondary | ICD-10-CM | POA: Insufficient documentation

## 2022-10-25 DIAGNOSIS — R480 Dyslexia and alexia: Secondary | ICD-10-CM

## 2022-10-25 MED ORDER — ENTRESTO 24-26 MG PO TABS: 1.0000 | ORAL_TABLET | Freq: Two times a day (BID) | ORAL | 11 refills | Status: DC

## 2022-10-25 MED ORDER — DAPAGLIFLOZIN PROPANEDIOL 10 MG PO TABS: 10.0000 mg | ORAL_TABLET | Freq: Every day | ORAL | 11 refills | Status: DC

## 2022-10-25 MED ORDER — APIXABAN 5 MG PO TABS: 5.0000 mg | ORAL_TABLET | Freq: Two times a day (BID) | ORAL | 3 refills | Status: DC

## 2022-10-25 MED ORDER — CARVEDILOL 3.125 MG PO TABS: 3.1250 mg | ORAL_TABLET | Freq: Two times a day (BID) | ORAL | 2 refills | Status: DC

## 2022-10-25 MED ORDER — SPIRONOLACTONE 25 MG PO TABS: 25.0000 mg | ORAL_TABLET | Freq: Every day | ORAL | 3 refills | Status: DC

## 2022-10-25 NOTE — Patient Instructions (Signed)
It was a pleasure seeing you today!  MEDICATIONS: -No medication changes today -Call if you have questions about your medications.  LABS: -We will call you if your labs need attention.  NEXT APPOINTMENT: Return to clinic on 01/18/23 with Dr. Shirlee Latch.  In general, to take care of your heart failure: -Limit your fluid intake to 2 Liters (half-gallon) per day.   -Limit your salt intake to ideally 2-3 grams (2000-3000 mg) per day. -Weigh yourself daily and record, and bring that "weight diary" to your next appointment.  (Weight gain of 2-3 pounds in 1 day typically means fluid weight.) -The medications for your heart are to help your heart and help you live longer.   -Please contact us before stopping any of your heart medications.  Call the clinic at [phone] with questions or to reschedule future appointments.

## 2022-10-25 NOTE — Progress Notes (Signed)
Advanced Heart Failure Clinic Note   PCP: Dr. Chanetta Marshall  HF Cardiology: Dr. Shirlee Latch  HPI 57 y.o. with history of high grade heart block s/p PPM placement, CVA, and chronic systolic CHF was referred from [facility] for evaluation of CHF.  Patient developed high grade heart block in 2021 and had a [employer] PPM with left bundle lead placed.  He is now 99% LB-paced.  Cause of heart block is uncertain.  No history of sarcoidosis or Lyme disease.  CT chest in 07/2022 showed no pulmonary findings concerning for sarcoidosis. Echo in 08/2019 showed EF 55-60%.  Patient did well until 07/2022 when he had an acute PCA CVA.  Echo in 07/2022 showed EF 20-25%, normal RV, mild TR.  TEE showed EF 20-25%, no LV thrombus, and negative bubble study.  He was started on Eliquis due to concern for cardioembolism.    LHC/RHC in 09/2022 showed no significant CAD, normal filling pressures, CI 3.32.   Last seen by Dr. Shirlee Latch on 10/06/22 where pt was doing well. Spironolactone was increased to 25 mg daily and pt was schedule for CMRI. MRI revealed small amount of scar in the inferior wall not suggestive of sarcoidosis, possibly from myocarditis. EF 14%.  Pt saw Dr. Nelly Laurence on 10/12/22 who has scheduled BiV device upgrade for 11/08/22.  Today he returns to HF clinic for pharmacist medication titration. Overall feeling fine. Has continued to have fatigue since his hospital admission, but is relatively unchanged since hospital discharge. Fatigue is worse in the AM prior to medications and improves after exercise. Denies dizziness, lightheadedness. Denies chest pain and palpitations. Breathing has been fine. Denies SOB except walking long distances up-hill. Able to complete all ADLs including mowing the lawn. Activity level is great, walking >1 mile per day. Weight at home is 132.8 pounds. Does not take any loop diuretics. Denies any LEE, PND, orthopnea. Appetite has been low. Follows low-salt diet. Patient is going on vacation next  week.  HF Medications: Carvedilol 3.125 mg BID  Entresto 24-26 mg BID  Spironolactone 25 mg faily Farxiga 10 mg daily   Has the patient been experiencing any side effects to the medications prescribed?  No  Does the patient have any problems obtaining medications due to transportation or finances?   No; Hospital doctor  Understanding of regimen: excellent Understanding of indications: excellent Potential of compliance: excellent Patient understands to avoid NSAIDs. Patient understands to avoid decongestants.    Pertinent Lab Values: 10/20/22 - Serum creatinine 1.13, BUN 22, Potassium 5.1, Sodium 137   Vital Signs: Weight: 135.6 lbs (last clinic weight: 136 lbs) Blood pressure: 118/78 mmHg  Heart rate: 89 bpm   Assessment/Plan: 1. High grade heart block: Patient has MDT PPM with left bundle lead, he is pacer dependent (>99% LB paced). Cause of heart block is uncertain.  Not known to have sarcoidosis or to have had Lyme disease. CTA chest in 07/2022 was not suggestive of pulmonary sarcoidosis. Paced QRS is quite wide at 180 msec, suspect significant dyssynchrony despite left bundle lead.  - He has seen Dr. Nelly Laurence, who is planning BiV device upgrade. Scheduled for 6/242/24. 2. CVA: Acute PCA CVA in 07/2022 with some residual difficulty with memory and reading. 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 No atrial fibrillation detected and bubble study negative. Of note, he is a Factor V Leiden heterozygote.  - Continue Eliquis due to concern for cardioembolic CVA from occult LV thrombus.  3. Chronic systolic CHF: Nonischemic cardiomyopathy.  Echo in 07/2022  with EF 20-25%, normal RV, mild TR.  LHC/RHC in 10/04/22 showed no significant coronary disease, normal filling pressures, preserved cardiac output.  Cardiomyopathy may have been triggered by long-term pacemaker-mediated dyssychrony (has LB lead but paced QRS is quite wide at 180 msec). Cardiac MRI 09/2022 revealed small  amount of scar in the inferior wall not suggestive of sarcoidosis, possibly from myocarditis. EF 14%.  - He is not volume overloaded on exam, NYHA class II symptoms. - Continue carvedilol 3.125 mg bid. Will not increase today given dyssynchrony and upcoming device upgrade. - Continue Entresto 24/26 mg bid. Recent SBPs in 80-90s and K >5. Will continue current dose. - Continue spironolactone 25 mg daily. BMET stable after recent increase. - Continue dapagliflozin 10 mg daily  Follow up with Dr. Shirlee Latch scheduled on 01/18/23  Karle Plumber, PharmD, BCPS, BCCP, CPP Heart Failure Clinic Pharmacist [phone]

## 2022-10-25 NOTE — Therapy (Signed)
OUTPATIENT SPEECH LANGUAGE PATHOLOGY TREATMENT   Patient Name: Luke Jimenez MRN: [id number] DOB:[birth year], 57 y.o., male Today's Date: 10/25/2022  PCP: Shon Hale MD (Documentation) REFERRING PROVIDER: Meredeth Ide, MD  END OF SESSION:  End of Session - 10/25/22 0858     Visit Number 11    Number of Visits 17    Date for SLP Re-Evaluation 11/17/22    SLP Start Time 0851    SLP Stop Time  0930    SLP Time Calculation (min) 39 min    Activity Tolerance Patient tolerated treatment well                 Past Medical History:  Diagnosis Date   Bradycardia    Second degree AV block    Stroke [facility])    Past Surgical History:  Procedure Laterality Date   BUBBLE STUDY  08/13/2022   Procedure: BUBBLE STUDY;  Surgeon: Christell Constant, MD;  Location: [facility] ENDOSCOPY;  Service: Cardiovascular;;   PACEMAKER IMPLANT N/A 12/20/2019   Procedure: PACEMAKER IMPLANT;  Surgeon: Hillis Range, MD;  Location: [facility] INVASIVE CV LAB;  Service: Cardiovascular;  Laterality: N/A;   RIGHT/LEFT HEART CATH AND CORONARY ANGIOGRAPHY N/A 09/28/2022   Procedure: RIGHT/LEFT HEART CATH AND CORONARY ANGIOGRAPHY;  Surgeon: Laurey Morale, MD;  Location: [facility] INVASIVE CV LAB;  Service: Cardiovascular;  Laterality: N/A;   TEE WITHOUT CARDIOVERSION N/A 08/13/2022   Procedure: TRANSESOPHAGEAL ECHOCARDIOGRAM (TEE);  Surgeon: Christell Constant, MD;  Location: [facility] ENDOSCOPY;  Service: Cardiovascular;  Laterality: N/A;   Patient Active Problem List   Diagnosis Date Noted   Heart failure, type unknown (HCC) 09/06/2022   Cardiomyopathy (HCC) 08/13/2022   Cerebrovascular accident (CVA) due to embolism of precerebral artery (HCC) 08/13/2022   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 male who presented to ED with complaints of nonspecific complaints of feeling off Pt family concerned that he was having difficulty with speaking over the phone. Also was having difficulty finding words with forgetfulness. Pt had slight headache yesterday but no other symptoms. CT head (08/09/22) showed hypodensity in the left temporal lobe suspicious for acute infarct. MRI brain (08/11/22) revealed "Acute left PCA territory infarcts". EEG (08/12/22) "suggestive of cortical dysfunction arising from left temporal region likely secondary to underlying stroke, post-ictal state. No further seizures were noted". PMH: second-degree AV block with pacemaker  PAIN: Are you having pain? No  FALLS: Has patient fallen in last 6 months?  No  PATIENT GOALS: Improve reading to be able to return to work  OBJECTIVE:   DIAGNOSTIC FINDINGS:  SLE 08/12/22: Assessment / Plan / Recommendation Clinical Impression     Pt presents with primarily cognitive communication deficits, with very few word finding difficulites and likely visual recognition deficits. He is oriented fully to self and place with difficulty recalling year (1994) and reason for hospitalization. Immediate recall of listed items and perfomance with simple mathematical calculation were [facility].  Cognitive deificts noted were in the areas of delayed recall (2/5 items recalled), complex verbal problem solving and executive function (inferencing, self-monitoring/correcting). During informal conversation, no word finding or expressive language difficulties noted.  Perseveration/hesistation exhibited during confrontational naming task in x1 instance. Receptive language appeared [facility] for tasks provided. During reading (words/sentences) and picture scene task, pt with difficulty recognizing/discriminating few pictured objects/words. Some hesitations evident during reading task and pt reports no issues with visual acuity, but with some delay in recognizing word. Would need to r/o any other visual deficits to determine if solely a recognition/discrimination issue. Recommend SLP services f/u acutely to treat primarily for cognitive communication function. Ongoing assessment/ dx treatment for language function also warranted. Wife present in room/via phone for majority of session is in agreement with recommendations.       PATIENT REPORTED OUTCOME MEASURES (PROM): To be completed week of 10/11/22.    TODAY'S TREATMENT:                                                                                                                                         DATE:  10/25/22: Pt is reading an older English novel and is using compensations from last few sessions as directed. SLP discussed how pt is going to compensate for short term memory. Pt remarked that he will need to ask boss for written tasks/modifications. SLP and pt agreed that he will need to prioritize tasks. He has done some of this prioritization already prior to CVA.  Pt and SLP agreed taking a longer break from ST will be nice to see if pt cont to progress with reading or stays the same over the time until his next appt the first week in July.   10/21/22: Reports slow progress with ongoing reduced processing speed, decreased short term recall, and ongoing reading challenges, although some improvement reported. Continues reading aloud as part of HEP. Indicated increased difficulty understanding context when reading aloud and decoding multi-syllabic words if not spelled phonetically. Educated and instructed reading  strategies to aid lexical contexts, such as reading silently for context prior to reading aloud, writing down errored words and re-reading, and using self-talk to slow thought processing down and aid attention (spelling aloud, saying next step). Pt completed unscrambling words/sentences task with mod I given task naturally slowed his rate and provided context (underlined or capitalized first letter). Provided as part of HEP. Pt and wife expressed appreciation for recommendations and SLP team providing multiple perspectives.   10/15/22: Since last session, pt kept his place with lining up the digital text with the top line and this has been successful as a place marker to incr attention. Pt has not used "speak" function on the iPhone yet. Slp told pt to get some Cobal text over the weekend and see if he can understand it. In multiple paragraph selections, pt took extra time to read and answered questions 100%  with looking back at text 20% of the time.  10/13/22: Pt stated his reading was improving and now he is only having difficulty with long/er words.SLP educated pt on speak function with iOS today with text messages. Today pt read a 7 sentence text message to SLP with mild and rare hesitation/pausing. Pt stated, "It seems sometime I'm missing the first letters of the words" and SLP told pt it appears that this would be a sx of decr'd attention to task when reading and reminded pt of compensatory strategies of using his finger or a straightedge to support his attention when reading.  10/07/22: SLP to provide pt with PROM next session. SLP worked with pt reading using his memory strategy handout rec'd in OT. Pt with hesitation with multisyllable words and SLP had pt implement technique discussed last session. Pt and wife had not had time to work on reading due to family plans the previous two days. Pt made errors reading on functor words and req'd SLP cues for awareness.   10/04/21: SLP suggested pt and wife do  more reading homework with the suggestions by SLP implemented (see pt instructions). Pt and wife will complete reading homework together for 25-30 minutes, x5 days/week.  SLP asked pt to think about how these deficits would impact him at work and pt stated he would need to write more notes for recall than he did prior to CVA. Pt does not know how reading deficit will affect him at work due to [employer] being another language, with other context cues.  09/30/22: Further targeted and analyzed reading today. Pt endorsed some reduced word recognition, particularly with consonant clusters and multisyllabic words. Targeted oral reading of Rainbow passage. SLP noted intermittent delays with more common sight words (ex: white, beautiful) as well as possible phonemic paraphasias (ex: when/then, universal/unusual, bright/bridge). Only self-corrected error x1. Encouraged metacognitive analysis of reading ability, in which pt identified not attending to entire word resulting in errors. Recommended visual supports (finger, visual marker) to aid attention and processing speed. Targeted minimal pairs, in which pt required some extended time and occasional mod A to correct errors. Recommended functional reading at home to aid word recognition.   09/27/22: Addressed reading with use of compensations to aid in attention. Pt benefiting from oral reading, aiding in recall of x7 pertinent details from paragraph level stimuli, in spite of occasional disruption in reading fluency. Provided education on use of text to speech settings on apple products to aid in reading practice. Recommend reading with support, then returning and re-reading IND to aid in increased fluency. Pt to trial reading aloud to optimize attention and recall of read material.   09/23/22: Pt and wife and SLP talked about pt's cognitive deficits - pt and wife agreed pt's overall processing is slower than before CVA. SLP could initiate (or complete) CLQT with pt next  session. Davis also endorses some deficits in higher level attention ("I just have to do one thing and then do the other thing - I can't do them together anymore."). With extra time allowed, pt showed good (not excellent) insight what he might need to tell his supervisor if he were told to return to work this week. During this discussion pt indicated he was more tired now than pre-CVA. 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 SLP continued the Reading Comprehenstion Battery of Aphasia - 2nd Ed. (RCBA-2); SLP-Anabel Lykins to complete the last subtest the  week of 10-04-22.  09/14/22: SLP initiated Reading Comprehenstion Battery of Aphasia - 2nd Ed. (RCBA-2). 8/10 subtests completed.   Pt with incr'd time necessary for all subtests. Single words - choice of f:3 words to match to pic took approx 13 seconds per picture. SLP to cont assessment next session. SLP told pt to continue to read at home - pt states he is getting good practice reading text messages.   09/08/22: Suggested home tasks for pt and wife  PATIENT EDUCATION: Education details: Results of eval, therapy course, home tasks Person educated: Patient and Spouse Education method: Explanation, Demonstration, and Verbal cues Education comprehension: verbalized understanding, returned demonstration, verbal cues required, and needs further education   GOALS: Goals reviewed with patient? No  SHORT TERM GOALS: Target date: 10/14/22  Pt will read 8-10 word sentence selections and indicate correct answers to written or spoken questions 100% success with extra time in 3 sessions Baseline: Goal status: MET  2.  Pt will demo understanding of mod complex emails of 10+ sentences with 100% success with compensations, in three sessions Baseline:  Goal status: MET  3.  Pt will write semi-technical 10+ sentence (work-like) messages and/or text messages with 100% success in 3  sessions Baseline:  Goal status: NOT MET - in LTGs  4.  Pt will undergo cognitive linguistic testing if clinically necessary Baseline:  Goal status: DEFERRED   LONG TERM GOALS: Target date: 11/16/22  Pt will have higher/better score on PROM compared to initial reading Baseline:  Goal status: IN PROGRESS  2.  Pt will read 15+ sentence mod complex selections with compensations in 3 sessions Baseline: 10/15/22 Goal status: IN PROGRESS  3.  Pt will write semi-technical 8+ sentence (work-like) messages and/or text messages with 100% success in 3 sessions Baseline:  Goal status: REVISED (from STGs)  4.  Pt will write 12-sentence selections (work like emails, texts, etc)  with compensations in 3 sessions Baseline:  Goal status: REVISED   ASSESSMENT:  CLINICAL IMPRESSION: Patient is a 57 y.o. male who was seen today for treatment of cognitive linguistics and with reading in light of CVAs in March. See "today's treatment" section for additional details. Pt's OT stated pt demonstrated cognitive deficits such as processing/mental calculation, and memory. Decided SLP will focus on reading and attention in reading, with OT targeting general cognition. 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    OBJECTIVE IMPAIRMENTS: include memory, expressive language, and aphasia. These impairments are limiting patient from return to work, ADLs/IADLs, and effectively communicating at home and in community. Factors affecting potential to achieve goals and functional outcome are ability to learn/carryover information. Patient will benefit from skilled SLP services to address above impairments and improve overall function.  REHAB POTENTIAL: Good  PLAN:  SLP FREQUENCY: 2x/week  SLP DURATION: 8 weeks  PLANNED INTERVENTIONS: Language facilitation, Environmental controls, Cueing hierachy, Cognitive reorganization, Internal/external aids,  Functional tasks, Multimodal communication approach, SLP instruction and feedback, Compensatory strategies, and Patient/family education    [facility], CCC-SLP 10/25/2022, 9:00 AM

## 2022-10-26 ENCOUNTER — Ambulatory Visit (INDEPENDENT_AMBULATORY_CARE_PROVIDER_SITE_OTHER): Payer: No Typology Code available for payment source | Admitting: Neurology

## 2022-10-26 ENCOUNTER — Encounter: Payer: Self-pay | Admitting: Neurology

## 2022-10-26 VITALS — BP 118/78 | HR 100 | Ht 70.0 in | Wt 134.8 lb

## 2022-10-26 DIAGNOSIS — G3184 Mild cognitive impairment, so stated: Secondary | ICD-10-CM

## 2022-10-26 DIAGNOSIS — I420 Dilated cardiomyopathy: Secondary | ICD-10-CM

## 2022-10-26 DIAGNOSIS — E782 Mixed hyperlipidemia: Secondary | ICD-10-CM

## 2022-10-26 DIAGNOSIS — R413 Other amnesia: Secondary | ICD-10-CM

## 2022-10-26 DIAGNOSIS — H53461 Homonymous bilateral field defects, right side: Secondary | ICD-10-CM | POA: Diagnosis not present

## 2022-10-26 DIAGNOSIS — I6622 Occlusion and stenosis of left posterior cerebral artery: Secondary | ICD-10-CM | POA: Diagnosis not present

## 2022-10-26 DIAGNOSIS — D6851 Activated protein C resistance: Secondary | ICD-10-CM

## 2022-10-26 MED ORDER — LEVETIRACETAM 750 MG PO TABS: 750.0000 mg | ORAL_TABLET | Freq: Two times a day (BID) | ORAL | 2 refills | Status: DC

## 2022-10-26 MED ORDER — LACOSAMIDE 200 MG PO TABS: 200.0000 mg | ORAL_TABLET | Freq: Two times a day (BID) | ORAL | 2 refills | Status: AC

## 2022-10-26 MED ORDER — ROSUVASTATIN CALCIUM 20 MG PO TABS: 20.0000 mg | ORAL_TABLET | Freq: Every day | ORAL | 5 refills | Status: DC

## 2022-10-26 NOTE — Patient Instructions (Addendum)
I had a long d/w patient about his recent stroke, risk for recurrent stroke/TIAs, personally independently reviewed imaging studies and stroke evaluation results and answered questions.Continue Eliquis (apixaban) daily  for secondary stroke prevention for his cardiomyopathy and maintain strict control of hypertension with blood pressure goal below 130/90, diabetes with hemoglobin A1c goal below 6.5% and lipids with LDL cholesterol goal below 70 mg/dL. I also advised the patient to eat a healthy diet with plenty of whole grains, cereals, fruits and vegetables, exercise regularly and maintain ideal body weight .start Crestor 20 mg daily for his elevated lipids.  Continue Vimpat 200 mg twice daily but reduce Keppra to 750 mg twice daily for seizures prevention.  Patient is still significantly disabled and is unable to return to work currently at this time.  I advised him to increase participation in cognitively challenging activities like solving crossword puzzles,,playing bridge and sudoku..  We also discussed memory compensation strategies.  Followup in the future with me in 6 months or call earlier if necessary.  Stroke Prevention Some medical conditions and behaviors can lead to a higher chance of having a stroke. You can help prevent a stroke by eating healthy, exercising, not smoking, and managing any medical conditions you have. Stroke is a leading cause of functional impairment. Primary prevention is particularly important because a majority of strokes are first-time events. Stroke changes the lives of not only those who experience a stroke but also their family and other caregivers. How can this condition affect me? A stroke is a medical emergency and should be treated right away. A stroke can lead to brain damage and can sometimes be life-threatening. If a person gets medical treatment right away, there is a better chance of surviving and recovering from a stroke. What can increase my risk? The  following medical conditions may increase your risk of a stroke: Cardiovascular disease. High blood pressure (hypertension). Diabetes. High cholesterol. Sickle cell disease. Blood clotting disorders (hypercoagulable state). Obesity. Sleep disorders (obstructive sleep apnea). Other risk factors include: Being older than age 78. Having a history of blood clots, stroke, or mini-stroke (transient ischemic attack, TIA). Genetic factors, such as race, ethnicity, or a family history of stroke. Smoking cigarettes or using other tobacco products. Taking birth control pills, especially if you also use tobacco. Heavy use of alcohol or drugs, especially cocaine and methamphetamine. Physical inactivity. What actions can I take to prevent this? Manage your health conditions High cholesterol levels. Eating a healthy diet is important for preventing high cholesterol. If cholesterol cannot be managed through diet alone, you may need to take medicines. Take any prescribed medicines to control your cholesterol as told by your health care provider. Hypertension. To reduce your risk of stroke, try to keep your blood pressure below 130/80. Eating a healthy diet and exercising regularly are important for controlling blood pressure. If these steps are not enough to manage your blood pressure, you may need to take medicines. Take any prescribed medicines to control hypertension as told by your health care provider. Ask your health care provider if you should monitor your blood pressure at home. Have your blood pressure checked every year, even if your blood pressure is normal. Blood pressure increases with age and some medical conditions. Diabetes. Eating a healthy diet and exercising regularly are important parts of managing your blood sugar (glucose). If your blood sugar cannot be managed through diet and exercise, you may need to take medicines. Take any prescribed medicines to control your diabetes as told  by your health care provider. Get evaluated for obstructive sleep apnea. Talk to your health care provider about getting a sleep evaluation if you snore a lot or have excessive sleepiness. Make sure that any other medical conditions you have, such as atrial fibrillation or atherosclerosis, are managed. Nutrition Follow instructions from your health care provider about what to eat or drink to help manage your health condition. These instructions may include: Reducing your daily calorie intake. Limiting how much salt (sodium) you use to 1,500 milligrams (mg) each day. Using only healthy fats for cooking, such as olive oil, canola oil, or sunflower oil. Eating healthy foods. You can do this by: Choosing foods that are high in fiber, such as whole grains, and fresh fruits and vegetables. Eating at least 5 servings of fruits and vegetables a day. Try to fill one-half of your plate with fruits and vegetables at each meal. Choosing lean protein foods, such as lean cuts of meat, poultry without skin, fish, tofu, beans, and nuts. Eating low-fat dairy products. Avoiding foods that are high in sodium. This can help lower blood pressure. Avoiding foods that have saturated fat, trans fat, and cholesterol. This can help prevent high cholesterol. Avoiding processed and prepared foods. Counting your daily carbohydrate intake.  Lifestyle If you drink alcohol: Limit how much you have to: 0-1 drink a day for women who are not pregnant. 0-2 drinks a day for men. Know how much alcohol is in your drink. In the [place] one drink equals one 12 oz bottle of beer ( ), one 5 oz glass of wine ( ), or one 1 oz glass of hard liquor (44mL). Do not use any products that contain nicotine or tobacco. These products include cigarettes, chewing tobacco, and vaping devices, such as e-cigarettes. If you need help quitting, ask your health care provider. Avoid secondhand smoke. Do not use drugs. Activity  Try to stay  at a healthy weight. Get at least 30 minutes of exercise on most days, such as: Fast walking. Biking. Swimming. Medicines Take over-the-counter and prescription medicines only as told by your health care provider. Aspirin or blood thinners (antiplatelets or anticoagulants) may be recommended to reduce your risk of forming blood clots that can lead to stroke. Avoid taking birth control pills. Talk to your health care provider about the risks of taking birth control pills if: You are over 70 years old. You smoke. You get very bad headaches. You have had a blood clot. Where to find more information American Stroke Association: www.strokeassociation.org Get help right away if: You or a loved one has any symptoms of a stroke. "BE FAST" is an easy way to remember the main warning signs of a stroke: B - Balance. Signs are dizziness, sudden trouble walking, or loss of balance. E - Eyes. Signs are trouble seeing or a sudden change in vision. F - Face. Signs are sudden weakness or numbness of the face, or the face or eyelid drooping on one side. A - Arms. Signs are weakness or numbness in an arm. This happens suddenly and usually on one side of the body. S - Speech. Signs are sudden trouble speaking, slurred speech, or trouble understanding what people say. T - Time. Time to call emergency services. Write down what time symptoms started. You or a loved one has other signs of a stroke, such as: A sudden, severe headache with no known cause. Nausea or vomiting. Seizure. These symptoms may represent a serious problem that is an emergency. Do not wait to  see if the symptoms will go away. Get medical help right away. Call your local emergency services (911 in the [place] Do not drive yourself to the hospital. Summary You can help to prevent a stroke by eating healthy, exercising, not smoking, limiting alcohol intake, and managing any medical conditions you may have. Do not use any products that contain  nicotine or tobacco. These include cigarettes, chewing tobacco, and vaping devices, such as e-cigarettes. If you need help quitting, ask your health care provider. Remember "BE FAST" for warning signs of a stroke. Get help right away if you or a loved one has any of these signs. This information is not intended to replace advice given to you by your health care provider. Make sure you discuss any questions you have with your health care provider. Document Revised: 04/05/2022 Document Reviewed: 04/05/2022 Elsevier Patient Education  2024 Elsevier Inc.  Memory Compensation Strategies  Use "WARM" strategy.  W= write it down  A= associate it  R= repeat it  M= make a mental note  2.   You can keep a Glass blower/designer.  Use a 3-ring notebook with sections for the following: calendar, important names and phone numbers,  medications, doctors' names/phone numbers, lists/reminders, and a section to journal what you did  each day.   3.    Use a calendar to write appointments down.  4.    Write yourself a schedule for the day.  This can be placed on the calendar or in a separate section of the Memory Notebook.  Keeping a  regular schedule can help memory.  5.    Use medication organizer with sections for each day or morning/evening pills.  You may need help loading it  6.    Keep a basket, or pegboard by the door.  Place items that you need to take out with you in the basket or on the pegboard.  You may also want to  include a message board for reminders.  7.    Use sticky notes.  Place sticky notes with reminders in a place where the task is performed.  For example: " turn off the  stove" placed by the stove, "lock the door" placed on the door at eye level, " take your medications" on  the bathroom mirror or by the place where you normally take your medications.  8.    Use alarms/timers.  Use while cooking to remind yourself to check on food or as a reminder to take your medicine, or as a   reminder to make a call, or as a reminder to perform another task, etc.

## 2022-10-26 NOTE — Progress Notes (Signed)
[facility] [address] [place]. [place] [address] [phone]       OFFICE FOLLOW-UP NOTE  Mr. Dorothy Landgrebe Date of Birth:  1966-01-25 Medical Record Number:  [id number]   HPI: Mr. Luke Jimenez is a 57 year old Caucasian male seen today for initial office visit following hospital admission for stroke in March 2024.  He is accompanied by his wife.  History is obtained from them and review of electronic medical records and I personally reviewed pertinent available imaging films in PACS.  Patient has past medical history of bradycardia and second-degree heart block.  He presented on 08/09/2022 with sudden onset of altered mental status with word finding difficulties and forgetfulness.  He had a slight headache.  Denies any extremity weakness.  CT head showed asymmetric hypodensity and loss of gray-white differentiation along the medial aspect of the left temporal lobe suspicious for acute infarct.  MRI scan confirmed acute left posterior cerebral artery infarct.  CT angiogram showed no large vessel stenosis or occlusion.  2D echo showed ejection fraction of 20 to 25% with global hypokinesis.   Large right to left shunt.  TEE however showed no evidence of PFO but showed severely decreased left ventricular ejection fraction.  CT angiogram of the chest shows no evidence of pulmonary AV fistula or embolism.  LDL cholesterol 149 mg percent.  Hemoglobin A1c was 5.4.  Hypercoagulable panel labs were all negative except patient had a factor V Leiden mutation heterozygote state.  Patient has no prior history of DVT or pulmonary embolism.  Patient was seen by cardiology on consultation and started on heart failure medications as well as Eliquis for anticoagulation due to his poor ejection fraction and high risk for cardiogenic embolism.  Overnight EEG showed silent seizures arising from the left temporal lobe.  Patient was initially started on Keppra and subsequently Vimpat was added.  Patient did not have  any clinical seizures.  Was discharged home physical occupational and speech therapy for still getting.  He states he is noticing improvement in peripheral vision loss.  He still has trouble reading question.  His short-term memory quite poor and he has trouble remembering things.  The speed of mental processing is also slow.  He is also has visual spatial difficulties.  Very poor energy is tolerating Eliquis well without bruising or bleeding.  He will clearly not be able to return to his job unless he shows significant improvement.  He has had multiple appointments with cardiology to optimize his heart failure.  He had a cardiac MRI as well.   ROS:   14 system review of systems is positive for  vision difficulties, memory loss, bruising, decreased energy, tiredness, visual-spatial difficulties, slowed mental processing ,difficulty reading and knows of systems negative  PMH:  Past Medical History:  Diagnosis Date   Bradycardia    Second degree AV block    Stroke [facility])     Social History:  Social History   Socioeconomic History   Marital status: Married    Spouse name: [name]   Number of children: 2   Years of education: Not on file   Highest education level: Bachelor's degree (e.g., BA, AB, BS)  Occupational History   Occupation: [employer]  Tobacco Use   Smoking status: Never    Passive exposure: Never   Smokeless tobacco: Never  Vaping Use   Vaping Use: Never used  Substance and Sexual Activity   Alcohol use: Yes    Comment: 1 drink a week   Drug  use: Never   Sexual activity: Not on file  Other Topics Concern   Not on file  Social History Narrative   Not on file   Social Determinants of Health   Financial Resource Strain: Low Risk  (08/13/2022)   Overall Financial Resource Strain (CARDIA)    Difficulty of Paying Living Expenses: Not hard at all  Food Insecurity: No Food Insecurity (08/13/2022)   Hunger Vital Sign    Worried About Running Out of Food in the  Last Year: Never true    Ran Out of Food in the Last Year: Never true  Transportation Needs: No Transportation Needs (08/13/2022)   PRAPARE - Administrator, Civil Service (Medical): No    Lack of Transportation (Non-Medical): No  Physical Activity: Not on file  Stress: Not on file  Social Connections: Not on file  Intimate Partner Violence: Not At Risk (08/10/2022)   Humiliation, Afraid, Rape, and Kick questionnaire    Fear of Current or Ex-Partner: No    Emotionally Abused: No    Physically Abused: No    Sexually Abused: No    Medications:   Current Outpatient Medications on File Prior to Visit  Medication Sig Dispense Refill   apixaban (ELIQUIS) 5 MG TABS tablet Take 1 tablet (5 mg total) by mouth 2 (two) times daily. 60 tablet 3   Ascorbic Acid (VITAMIN C) 1000 MG tablet Take 1,000 mg by mouth 2 (two) times daily at 10 AM and 5 PM.     carvedilol (COREG) 3.125 MG tablet Take 1 tablet (3.125 mg total) by mouth 2 (two) times daily with a meal. HOLD for SBP less than 90 60 tablet 2   dapagliflozin propanediol (FARXIGA) 10 MG TABS tablet Take 1 tablet (10 mg total) by mouth daily before breakfast. 30 tablet 11   sacubitril-valsartan (ENTRESTO) 24-26 MG Take 1 tablet by mouth 2 (two) times daily. 60 tablet 11   spironolactone (ALDACTONE) 25 MG tablet Take 1 tablet (25 mg total) by mouth daily. 90 tablet 3   thyroid (ARMOUR) 60 MG tablet Take 60 mg by mouth daily before breakfast.     cetirizine (ZYRTEC) 10 MG tablet Take 10 mg by mouth daily as needed for allergies. (Patient not taking: Reported on 10/25/2022)     No current facility-administered medications on file prior to visit.    Allergies:  No Known Allergies  Physical Exam General: well developed, well nourished pleasant middle-aged Caucasian male, seated, in no evident distress Head: head normocephalic and atraumatic.  Neck: supple with no carotid or supraclavicular bruits Cardiovascular: regular rate and rhythm,  no murmurs Musculoskeletal: no deformity Skin:  no rash/petichiae Vascular:  Normal pulses all extremities Vitals:   10/26/22 0913  BP: 118/78  Pulse: 100   Neurologic Exam Mental Status: Awake and fully alert. Oriented to place and time. Recent and remote memory intact. Attention span, concentration and fund of knowledge appropriate. Mood and affect appropriate.  Diminished recall 0/3.  Able to name only 8 animals which can walk on 4 legs.  Clock drawing 4/4.  Mini-Mental status exam 28/30 Cranial Nerves: Fundoscopic exam reveals sharp disc margins. Pupils equal, briskly reactive to light. Extraocular movements full without nystagmus. Visual fields partial right inferior quadrantanopsia to confrontation. Hearing intact. Facial sensation intact. Face, tongue, palate moves normally and symmetrically.  Motor: Normal bulk and tone. Normal strength in all tested extremity muscles. Sensory.: intact to touch ,pinprick .position and vibratory sensation.  Coordination: Rapid alternating movements normal in all  extremities. Finger-to-nose and heel-to-shin performed accurately bilaterally. Gait and Station: Arises from chair without difficulty. Stance is normal. Gait demonstrates normal stride length and balance . Able to heel, toe and tandem walk without difficulty.  Reflexes: 1+ and symmetric. Toes downgoing.   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  Vascular risk factors of cardiomyopathy and hyperlipidemia .  He is doing well but has  mild residual right side.  Visual field defect and mild cognitive impairment.  He also has incidental factor V Leiden heterozygote. state     PLAN:I had a long d/w patient about his recent stroke, risk for recurrent stroke/TIAs, personally independently reviewed imaging studies and stroke evaluation results and answered questions.Continue Eliquis (apixaban) daily  for secondary stroke prevention for his cardiomyopathy and maintain strict control of hypertension with blood pressure goal below 130/90, diabetes with hemoglobin A1c goal below 6.5% and lipids with LDL cholesterol goal below 70 mg/dL. I also advised the patient to eat a healthy diet with plenty of whole grains, cereals, fruits and vegetables, exercise regularly and maintain ideal body weight .start Crestor 20 mg daily for his elevated lipids.  Continue Vimpat 200 mg twice daily but reduce Keppra to 750 mg twice daily for seizures prevention.  Patient is still significantly disabled and is unable to return to work currently at this time.  I advised him to increase participation in cognitively challenging activities like solving crossword puzzles,,playing bridge and sudoku..  We also discussed memory compensation strategies.  Followup in the future with me in 6 months or call earlier if necessary. Greater than 50% of time during this 45 minute visit was spent on counseling,explanation of diagnosis PCA stroke, cardiomyopathy and cardiogenic embolism, planning of further management, discussion with patient and family and coordination of care Delia Heady, MD Note: This document was prepared with digital dictation and possible smart phrase technology. Any transcriptional errors that result from this process are unintentional

## 2022-10-27 DIAGNOSIS — Z0289 Encounter for other administrative examinations: Secondary | ICD-10-CM

## 2022-11-05 NOTE — Pre-Procedure Instructions (Signed)
Attempted to call patient regarding procedure instructions.  Left voicemail on the following items: Arrival time 1130 Nothing to eat or drink after midnight No meds AM of procedure Responsible person to drive you home and stay with you for 24 hrs Wash with special soap night before and morning of procedure If on anti-coagulant drug instructions Eliquis- last dose 6/21.  Marcelline Deist- last dose 6/20

## 2022-11-08 ENCOUNTER — Other Ambulatory Visit: Payer: Self-pay

## 2022-11-08 ENCOUNTER — Encounter (HOSPITAL_COMMUNITY)
Admission: RE | Disposition: A | Discharge status: Home / Self Care | Payer: No Typology Code available for payment source | Source: Ambulatory Visit | Attending: Cardiovascular Disease

## 2022-11-08 ENCOUNTER — Ambulatory Visit (HOSPITAL_COMMUNITY)
Admission: RE | Admit: 2022-11-08 | Discharge: 2022-11-08 | Disposition: A | Discharge status: Home / Self Care | Payer: No Typology Code available for payment source | Source: Ambulatory Visit | Attending: Cardiovascular Disease | Admitting: Cardiovascular Disease

## 2022-11-08 ENCOUNTER — Ambulatory Visit (HOSPITAL_COMMUNITY): Payer: No Typology Code available for payment source

## 2022-11-08 DIAGNOSIS — I5020 Unspecified systolic (congestive) heart failure: Secondary | ICD-10-CM | POA: Diagnosis not present

## 2022-11-08 DIAGNOSIS — I429 Cardiomyopathy, unspecified: Secondary | ICD-10-CM | POA: Insufficient documentation

## 2022-11-08 DIAGNOSIS — I509 Heart failure, unspecified: Secondary | ICD-10-CM | POA: Diagnosis not present

## 2022-11-08 DIAGNOSIS — Z45010 Encounter for checking and testing of cardiac pacemaker pulse generator [battery]: Secondary | ICD-10-CM | POA: Diagnosis not present

## 2022-11-08 DIAGNOSIS — I442 Atrioventricular block, complete: Secondary | ICD-10-CM | POA: Insufficient documentation

## 2022-11-08 HISTORY — PX: BIV UPGRADE: EP1202

## 2022-11-08 SURGERY — BIV UPGRADE

## 2022-11-08 MED ORDER — ONDANSETRON HCL 4 MG/2ML IJ SOLN: 4.0000 mg | Freq: Four times a day (QID) | INTRAMUSCULAR | Status: DC | PRN

## 2022-11-08 MED ORDER — HEPARIN (PORCINE) IN NACL 1000-0.9 UT/500ML-% IV SOLN
INTRAVENOUS | Status: DC | PRN
  Administered 2022-11-08: 500 mL

## 2022-11-08 MED ORDER — SODIUM CHLORIDE 0.9 % IV SOLN
INTRAVENOUS | Status: AC
  Filled 2022-11-08: qty 2

## 2022-11-08 MED ORDER — ACETAMINOPHEN 325 MG PO TABS: 325.0000 mg | ORAL_TABLET | ORAL | Status: DC | PRN

## 2022-11-08 MED ORDER — SODIUM CHLORIDE 0.9 % IV SOLN
80.0000 mg | INTRAVENOUS | Status: AC
  Administered 2022-11-08: 80 mg

## 2022-11-08 MED ORDER — MIDAZOLAM HCL 5 MG/5ML IJ SOLN
INTRAMUSCULAR | Status: AC
  Filled 2022-11-08: qty 5

## 2022-11-08 MED ORDER — BUPIVACAINE HCL (PF) 0.25 % IJ SOLN
INTRAMUSCULAR | Status: DC | PRN
  Administered 2022-11-08: 60 mL

## 2022-11-08 MED ORDER — FENTANYL CITRATE (PF) 100 MCG/2ML IJ SOLN
INTRAMUSCULAR | Status: DC | PRN
  Administered 2022-11-08 (×3): 25 ug via INTRAVENOUS

## 2022-11-08 MED ORDER — BUPIVACAINE HCL (PF) 0.25 % IJ SOLN
INTRAMUSCULAR | Status: AC
  Filled 2022-11-08: qty 30

## 2022-11-08 MED ORDER — MIDAZOLAM HCL 5 MG/5ML IJ SOLN
INTRAMUSCULAR | Status: DC | PRN
  Administered 2022-11-08 (×3): 1 mg via INTRAVENOUS

## 2022-11-08 MED ORDER — CHLORHEXIDINE GLUCONATE 4 % EX SOLN
4.0000 | Freq: Once | CUTANEOUS | Status: DC
  Filled 2022-11-08: qty 60

## 2022-11-08 MED ORDER — SODIUM CHLORIDE 0.9 % IV SOLN: INTRAVENOUS | Status: DC

## 2022-11-08 MED ORDER — FENTANYL CITRATE (PF) 100 MCG/2ML IJ SOLN
INTRAMUSCULAR | Status: AC
  Filled 2022-11-08: qty 2

## 2022-11-08 MED ORDER — IOHEXOL 350 MG/ML SOLN
INTRAVENOUS | Status: DC | PRN
  Administered 2022-11-08: 15 mL via INTRAVENOUS
  Administered 2022-11-08: 5 mL via INTRAVENOUS

## 2022-11-08 MED ORDER — POVIDONE-IODINE 10 % EX SWAB
2.0000 | Freq: Once | CUTANEOUS | Status: AC
  Administered 2022-11-08: 2 via TOPICAL

## 2022-11-08 MED ORDER — CEFAZOLIN SODIUM-DEXTROSE 2-4 GM/100ML-% IV SOLN
INTRAVENOUS | Status: AC
  Filled 2022-11-08: qty 100

## 2022-11-08 MED ORDER — CEFAZOLIN SODIUM-DEXTROSE 2-4 GM/100ML-% IV SOLN
2.0000 g | INTRAVENOUS | Status: AC
  Administered 2022-11-08: 2 g via INTRAVENOUS

## 2022-11-08 SURGICAL SUPPLY — 16 items
BALLN ATTAIN 80 (BALLOONS) ×1
BALLOON ATTAIN 80 (BALLOONS) IMPLANT
CABLE SURGICAL S-101-97-12 (CABLE) ×1 IMPLANT
CATH ATTAIN COM SURV 6250V-EH (CATHETERS) IMPLANT
DEVICE CRTP PERCEPTA QUAD MRI (Pacemaker) IMPLANT
KIT ESSENTIALS PG (KITS) IMPLANT
KIT MICROPUNCTURE NIT STIFF (SHEATH) IMPLANT
LEAD ATTAIN PERFORMA S 4598-88 (Lead) IMPLANT
PAD DEFIB RADIO PHYSIO CONN (PAD) ×1 IMPLANT
POUCH AIGIS-R ANTIBACT ICD (Mesh General) ×1 IMPLANT
POUCH AIGIS-R ANTIBACT ICD LRG (Mesh General) IMPLANT
SHEATH 9.5FR PRELUDE SNAP 13 (SHEATH) IMPLANT
SLITTER 6232ADJ (MISCELLANEOUS) IMPLANT
TRAY PACEMAKER INSERTION (PACKS) ×1 IMPLANT
WIRE ACUITY WHISPER EDS 4648 (WIRE) IMPLANT
WIRE HI TORQ VERSACORE-J 145CM (WIRE) IMPLANT

## 2022-11-08 NOTE — Interval H&P Note (Signed)
History and Physical Interval Note:  11/08/2022 3:06 PM  Luke Jimenez  has presented today for surgery, with the diagnosis of cardiomyopathy.  The various methods of treatment have been discussed with the patient and family. After consideration of risks, benefits and other options for treatment, the patient has consented to  Procedure(s): BIVI PACEMAKER UPGRADE (N/A) as a surgical intervention.  The patient's history has been reviewed, patient examined, no change in status, stable for surgery.  I have reviewed the patient's chart and labs.  Questions were answered to the patient's satisfaction.     Roberts Gaudy Jacobb Alen

## 2022-11-08 NOTE — Discharge Instructions (Signed)
After Your ICD (Implantable Cardiac Defibrillator)   You have a Medtronic ICD  ACTIVITY Do not lift your arm above shoulder height for 1 week after your procedure. After 7 days, you may progress as below.  You should remove your sling 24 hours after your procedure, unless otherwise instructed by your provider.     Monday November 15, 2022  Tuesday November 16, 2022 Wednesday November 17, 2022 Thursday November 18, 2022   Do not lift, push, pull, or carry anything over 10 pounds with the affected arm until 6 weeks (Monday December 20, 2022 ) after your procedure.   You may drive AFTER your wound check, unless you have been told otherwise by your provider.   Ask your healthcare provider when you can go back to work   INCISION/Dressing If you are on a blood thinner such as Eliquis, resume in 3 days (Friday)  If large square, outer bandage is left in place, this can be removed after 24 hours from your procedure. Do not remove steri-strips or glue as below.   Monitor your defibrillator site for redness, swelling, and drainage. Call the device clinic at [phone] if you experience these symptoms or fever/chills. .    If you were discharged in a sling, please do not wear this during the day more than 48 hours after your surgery unless otherwise instructed. This may increase the risk of stiffness and soreness in your shoulder.   Avoid lotions, ointments, or perfumes over your incision until it is well-healed.  You may use a hot tub or a pool AFTER your wound check appointment if the incision is completely closed.  Your ICD is designed to protect you from life threatening heart rhythms. Because of this, you may receive a shock.   1 shock with no symptoms:  Call the office during business hours. 1 shock with symptoms (chest pain, chest pressure, dizziness, lightheadedness, shortness of breath, overall feeling unwell):  Call 911. If you experience 2 or more shocks in 24 hours:  Call 911. If you receive a  shock, you should not drive for 6 months per the [place] DMV IF you receive appropriate therapy from your ICD.   ICD Alerts:  Some alerts are vibratory and others beep. These are NOT emergencies. Please call our office to let us know. If this occurs at night or on weekends, it can wait until the next business day. Send a remote transmission.  If your device is capable of reading fluid status (for heart failure), you will be offered monthly monitoring to review this with you.   DEVICE MANAGEMENT Remote monitoring is used to monitor your ICD from home. This monitoring is scheduled every 91 days by our office. It allows [place] to keep an eye on the functioning of your device to ensure it is working properly. You will routinely see your Electrophysiologist annually (more often if necessary).   You should receive your ID card for your new device in 4-8 weeks. Keep this card with you at all times once received. Consider wearing a medical alert bracelet or necklace.  Your ICD  may be MRI compatible. This will be discussed at your next office visit/wound check.  You should avoid contact with strong electric or magnetic fields.   Do not use amateur (ham) radio equipment or electric (arc) welding torches. MP3 player headphones with magnets should not be used. Some devices are safe to use if held at least 12 inches (30 cm) from your defibrillator. These include power tools,  lawn mowers, and speakers. If you are unsure if something is safe to use, ask your health care provider.  When using your cell phone, hold it to the ear that is on the opposite side from the defibrillator. Do not leave your cell phone in a pocket over the defibrillator.  You may safely use electric blankets, heating pads, computers, and microwave ovens.  Call the office right away if: You have chest pain. You feel more than one shock. You feel more short of breath than you have felt before. You feel more light-headed than you have felt  before. Your incision starts to open up.  This information is not intended to replace advice given to you by your health care provider. Make sure you discuss any questions you have with your health care provider.

## 2022-11-09 ENCOUNTER — Encounter (HOSPITAL_COMMUNITY): Payer: Self-pay | Admitting: Cardiovascular Disease

## 2022-11-10 ENCOUNTER — Telehealth: Payer: Self-pay

## 2022-11-10 ENCOUNTER — Other Ambulatory Visit: Payer: No Typology Code available for payment source

## 2022-11-10 ENCOUNTER — Encounter: Payer: No Typology Code available for payment source | Admitting: Speech Pathology

## 2022-11-10 NOTE — Telephone Encounter (Signed)
Confirmed with patient to restart Eliquis on 11/12/22. Sling removed.  Large dressing removed.  Discussed wound care/education.   Patient was unable to send transmission through app and it stated to retry later. Advised patient to try again in 1-2 hours. Routing to [facility] pool for f/u.

## 2022-11-10 NOTE — Telephone Encounter (Signed)
-----   Message from Sheilah Pigeon, [place] sent at 11/08/2022  5:29 PM EDT ----- PPM > CRT-P Abbott AM Same day d/c Resume Eliquis 11/12/22  renee

## 2022-11-10 NOTE — Telephone Encounter (Signed)
Attempted to complete same day d/c. No answer, LMTCB.  Follow-up after same day discharge: Implant date: 11/08/22 MD: Dr. York Pellant Device: Medtronic [id number] Percepta Quad CRT-P Location: Left Chest   Wound check visit: 12/01/22 @ 8:40 AM 90 day MD follow-up: 02/15/23 @ 9:15 AM  Remote Transmission received:No  Dressing/sling removed: TBD  Confirm OAC restart on: Eliquis 11/12/22

## 2022-11-11 ENCOUNTER — Encounter (HOSPITAL_COMMUNITY): Payer: No Typology Code available for payment source | Admitting: Cardiology

## 2022-11-11 NOTE — Telephone Encounter (Signed)
Normal transmission

## 2022-11-11 NOTE — Telephone Encounter (Signed)
Transmission received 11/10/2022

## 2022-11-15 ENCOUNTER — Ambulatory Visit: Payer: No Typology Code available for payment source

## 2022-11-17 ENCOUNTER — Ambulatory Visit: Payer: No Typology Code available for payment source

## 2022-11-23 ENCOUNTER — Ambulatory Visit (HOSPITAL_COMMUNITY): Payer: No Typology Code available for payment source

## 2022-11-29 ENCOUNTER — Ambulatory Visit: Payer: No Typology Code available for payment source

## 2022-11-29 ENCOUNTER — Ambulatory Visit: Payer: No Typology Code available for payment source | Attending: Family Medicine | Admitting: Occupational Therapy

## 2022-11-29 DIAGNOSIS — R480 Dyslexia and alexia: Secondary | ICD-10-CM

## 2022-11-29 DIAGNOSIS — R4701 Aphasia: Secondary | ICD-10-CM | POA: Insufficient documentation

## 2022-11-29 DIAGNOSIS — R41841 Cognitive communication deficit: Secondary | ICD-10-CM

## 2022-11-29 DIAGNOSIS — I69318 Other symptoms and signs involving cognitive functions following cerebral infarction: Secondary | ICD-10-CM | POA: Insufficient documentation

## 2022-11-29 DIAGNOSIS — R41840 Attention and concentration deficit: Secondary | ICD-10-CM | POA: Insufficient documentation

## 2022-11-29 NOTE — Therapy (Signed)
OUTPATIENT OCCUPATIONAL THERAPY NEURO Treatment Note  Patient Name: Luke Jimenez MRN: [id number] DOB:1965/08/17, 57 y.o., male Today's Date: 11/29/2022  PCP: Shon Hale, MD REFERRING PROVIDER: Meredeth Ide, MD  END OF SESSION:  OT End of Session - 11/29/22 1541     Visit Number 11    Number of Visits 13    Date for OT Re-Evaluation 12/30/22    Authorization Type [employer]/ Preferred    OT Start Time 1316    OT Stop Time 1400    OT Time Calculation (min) 44 min                      Past Medical History:  Diagnosis Date   Bradycardia    Second degree AV block    [facility])    Past Surgical History:  Procedure Laterality Date   BIV UPGRADE N/A 11/08/2022   Procedure: BIVI PACEMAKER UPGRADE;  Surgeon: Maurice Small, MD;  Location: [facility] INVASIVE CV LAB;  Service: Cardiovascular;  Laterality: N/A;   BUBBLE STUDY  08/13/2022   Procedure: BUBBLE STUDY;  Surgeon: Christell Constant, MD;  Location: [facility] ENDOSCOPY;  Service: Cardiovascular;;   PACEMAKER IMPLANT N/A 12/20/2019   Procedure: PACEMAKER IMPLANT;  Surgeon: Hillis Range, MD;  Location: [facility] INVASIVE CV LAB;  Service: Cardiovascular;  Laterality: N/A;   RIGHT/LEFT HEART CATH AND CORONARY ANGIOGRAPHY N/A 09/28/2022   Procedure: RIGHT/LEFT HEART CATH AND CORONARY ANGIOGRAPHY;  Surgeon: Laurey Morale, MD;  Location: [facility] INVASIVE CV LAB;  Service: Cardiovascular;  Laterality: N/A;   TEE WITHOUT CARDIOVERSION N/A 08/13/2022   Procedure: TRANSESOPHAGEAL ECHOCARDIOGRAM (TEE);  Surgeon: Christell Constant, MD;  Location: [facility] ENDOSCOPY;  Service: Cardiovascular;  Laterality: N/A;   Patient Active Problem List   Diagnosis Date Noted   Heart failure, type unknown (HCC) 09/06/2022   Cardiomyopathy (HCC) 08/13/2022   Cerebrovascular accident (CVA) due to embolism of precerebral artery (HCC) 08/13/2022   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) 08/11/2022   Cryptogenic stroke (HCC)  08/11/2022   Seizure (HCC) 08/10/2022   AMS (altered mental status) 08/09/2022   ATRIOVENTRICULAR BLOCK, 2ND DEGREE 06/17/2010    ONSET DATE: 08/09/22  REFERRING DIAG: I63.10 (ICD-10-CM) - Cerebrovascular accident (CVA) due to embolism of precerebral artery  THERAPY DIAG:  Attention and concentration deficit  Other symptoms and signs involving cognitive functions following cerebral infarctionLama, Sarina Ill, MD  Rationale for Evaluation and Treatment: Rehabilitation  SUBJECTIVE:   SUBJECTIVE STATEMENT: Pt reports "the big things is surgery" - reports feeling like he is thinking clearer and faster than before.  Reports that he can think in the future and past vs during previous therapy sessions pt was only in the "here and now".   Pt accompanied by: self and significant other (spouse [name])  PERTINENT HISTORY: Pt is a 57 y.o. male who presented 08/09/22 with speech difficulty and AMS. 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 EEG also showing seizures. PMH: second-degree AV block with pacemaker, bradycardia  PRECAUTIONS: None  WEIGHT BEARING RESTRICTIONS: No  PAIN:  Are you having pain? No  FALLS: Has patient fallen in last 6 months? No  LIVING ENVIRONMENT: Lives with: lives with their spouse and family nearby Lives in: House/apartment Stairs: Yes: Internal: bedroom/bathroom up full flight of steps; and External: 4-5 steps Has following equipment at home: Grab bars and built in shower seat, hand held shower head  PLOF: Independent, Independent with basic ADLs, and Vocation/Vocational requirements:  Quarry manager   PATIENT GOALS: to improve in reading and short term memory  OBJECTIVE:   HAND DOMINANCE: Right  IADLs: Shopping: has gone to store with spouse, with ability to walk away to retreive an item and return to her  Meal Prep: microwave to warm up leftovers, but did not really cook before Community mobility: not  cleared to drive Medication management: spouse is Control and instrumentation engineer: spouse takes care of and did before  COGNITION: Overall cognitive status:  Impaired with significant impairments to time orientation, sequencing, attention, and short term memory/recall  VISION: Subjective report: no changes  VISION ASSESSMENT: To be further assessed in functional context While in hospital, OT noted questionable R inattention, to be further assessed in subsequent visits  Patient has difficulty with following activities due to following visual impairments: reading - unsure if attention, memory, or visual  TODAY'S TREATMENT:                                    11/29/22 Engaged in lengthy discussion about progress s/p cardiac surgery and improvements with working memory and sequencing.  Pt reports "holes" are still in short term memory, however it is better s/p surgery.  Pt does recognize that he will need to continue to write notes to aid in recall of more detailed tasks.  Pt reports that he feels like his family was helping "quite a bit more" vs now they are helping him "a little bit".  OT educated on lumosity app for aspects of memory and dual tasking.  Pt reports that he was aware of the app but had not completely set it up yet.  Engaged in discussion of progress towards goals with pt demonstrating improvements in engagement in IADLs with simple meal prep and successful navigation of airport and packing for trip requiring dual tasking in distracting environment.  Engaged in discussion about return to work recommendations and provided pt and spouse with Return to Work Readiness checklist from [employer].  Discussed options for modifying work schedule/work day as needed once cleared by neurologist and recommendations to request from supervisor as needed to facilitate more seamless transition back to work once cleared.     10/21/22 Memory: Pt's spouse reports that pt continues to ask  same questions repeatedly during day/week and/or repeating himself with decreased or no recollection.  OT educated on recommendation to stop what you are doing to register the response and even repeat the response/write it down to increase carryover/recall.  Educated on importance of engagement and sustained attention when asking and receiving response.  Discussed compensatory strategies of writing things down, utilizing voice apps to allow quicker exchange.  Reiterated education on being present and engaged with task and decreasing external stimuli to facilitate increased carryover/recall of information. Awareness: Pt is demonstrating increased recognition of situations, (e.g. Pt reports that he will be heading somewhere to address something and 2nd thought will come in to where he either forgets first thought or will forget 2nd if he continues on with 1st) whereas he would not even recall situation at all.  Reiterated compensatory strategies to facilitate increased awareness/attention with decreasing external stimuli.  Pt reports that he has noticed a slowing down of his speed with tasks such as reading, allowing for increased processing or recognition with reading.    10/19/22 Dual tasking: engaged in education on dual tasking with focus on increased awareness of importance of honing  one task and then adding distractions as appropriate to further challenge attention.  Pt continues to demonstrate selective and intermittent alternating attention at this point. Memory: reviewed routine tasks where pt continues to struggle with recall, particularly when it comes to location of household items.  Pt reports that pt would aid in laundry tasks prior to CVA and now will ask where particular towels go as he does not recall location of certain items.  OT educated on importance of attention to task during completion of task to attempt to promote carryover of information.   List making: OT challenged pt to make a list  of items needed to pack for upcoming trip.  Reviewed use of list making as memory strategy and ability to update and modify list as he goes.  Encouraged pt to begin his list for trip this week to allow for time to recall forgotten items.  Pt demonstrating increased spelling errors when attempting to complete with alternating attention vs sustained/selective attention.    PATIENT EDUCATION: Education details: ongoing condition specific education and use of lists and/or voice notes on phone for recall Person educated: Patient and Spouse Education method: Explanation Education comprehension: verbalized understanding and needs further education  HOME EXERCISE PROGRAM: TBD   GOALS: Goals reviewed with patient? Yes  SHORT TERM GOALS: Target date: 10/08/22  Pt will verbalize understanding of task modifications and/or potential A/E needs to increase ease, safety, and independence w/ ADLs and IADLs. Baseline:  Goal status: MET - 10/07/22  2.  Pt will be able to complete simulated visual scanning activity w/ at least 95% accuracy (finding targets around therapy gym, searching for items on shelves), incorporating visual and memory compensatory strategies prn Baseline:  Goal status: IN PROGRESS  3.  Pt will demonstrate improved memory, sequencing, and problem solving as demonstrated by improvements in score on pill box assessment by ability to complete in 5 mins time limit with </= to 3 errors. Baseline:  Goal status: IN PROGRESS  4.   Pt will complete table top scanning activity with Supervision, min cues for recall of instructions, sequencing during task. Baseline:  Goal status: MET - 10/05/22  5.   Pt will be able to demonstrate ability to alternate attention for 5 mins during table top task. Baseline:  Goal status: NOT MET - 10/07/22  LONG TERM GOALS: Target date: 11/05/22  Pt will demonstrate ability to sequence simple functional task (simple snack prep, laundry task, etc) at Mod I level  with good safety awareness and ability to utilize memory strategies for recall. Baseline:  Goal status: MET - 11/29/22  2.  Pt will navigate a moderately busy environment, completing dual task activity and/or following multi-step commands with 90% accuracy Baseline:  Goal status: MET - 11/29/22  3.  Pt will complete simulated medication management activity w/ Mod Ind by discharge, incorporating compensatory strategies/AE prn Baseline:  Goal status: NOT MET  4.  Pt will be independent with utilization of memory strategies to allow for increased independence with managing personal appointments. Baseline:  Goal status: NOT MET  5.  Pt will be able to demonstrate ability to divide attention for 5 mins during functional task. Baseline:  Goal status: MET - 11/29/22  LONG TERM GOALS: Target date: 12/30/22   1.  Pt will complete simulated medication management activity w/ Mod Ind by discharge, incorporating compensatory  strategies/AE prn Baseline:  Goal status:INITIAL  2.  Pt will be independent with utilization of memory strategies to allow for increased independence with  managing personal appointments. Baseline:  Goal status: INITIAL  3.  Pt will report understanding of return to work readiness and adaptations recommended.    Baseline:  Goal status: INITIAL     ASSESSMENT:  CLINICAL IMPRESSION: Pt has made tremendous progress since last seen for OT.  Pt is demonstrating improved recall, working memory, and Education officer, environmental.  Pt attributes improvements to increased heart function s/p surgery.  Pt asking questions about return to work and strategies to continue to improve memory.  OT discussed lumosity app and provided examples of activities in app as well as providing pt and spouse with Return to Work readiness handout with plans to go through and assess areas of deficiency that may need accommodations as return to work becomes more imminent.  Pt will benefit from about 2 more visits  to continue to focus on remaining POC and transition to tasks he can continue to complete either with SLP or at home.  PERFORMANCE DEFICITS: in functional skills including IADLs, endurance, cardiopulmonary status limiting function, decreased knowledge of precautions, and decreased knowledge of use of DME, cognitive skills including attention, memory, orientation, problem solving, and sequencing, and psychosocial skills including coping strategies, environmental adaptation, and routines and behaviors.   IMPAIRMENTS: are limiting patient from IADLs, work, and social participation.   CO-MORBIDITIES: may have co-morbidities  that affects occupational performance. Patient will benefit from skilled OT to address above impairments and improve overall function.  MODIFICATION OR ASSISTANCE TO COMPLETE EVALUATION: Min-Moderate modification of tasks or assist with assess necessary to complete an evaluation.  OT OCCUPATIONAL PROFILE AND HISTORY: Problem focused assessment: Including review of records relating to presenting problem.  CLINICAL DECISION MAKING: Moderate - several treatment options, min-mod task modification necessary  REHAB POTENTIAL: Good  EVALUATION COMPLEXITY: Moderate    PLAN:  OT FREQUENCY: 1-2x/week  OT DURATION: 8 weeks  PLANNED INTERVENTIONS: self care/ADL training, therapeutic activity, neuromuscular re-education, functional mobility training, patient/family education, cognitive remediation/compensation, visual/perceptual remediation/compensation, psychosocial skills training, energy conservation, and coping strategies training  RECOMMENDED OTHER SERVICES: SLP   CONSULTED AND AGREED WITH PLAN OF CARE: Patient and family member/caregiver  PLAN FOR NEXT SESSION: STM, sequencing, RTW discussion.   Rosalio Loud, OTR/L 11/29/2022, 3:42 PM

## 2022-11-29 NOTE — Therapy (Unsigned)
OUTPATIENT SPEECH LANGUAGE PATHOLOGY TREATMENT   Patient Name: Luke Jimenez MRN: [id number] DOB:10/13/1965, 57 y.o., male Today's Date: 11/30/2022  PCP: Shon Hale MD (Documentation) REFERRING PROVIDER: Meredeth Ide, MD  END OF SESSION:  End of Session - 11/30/22 0821     Visit Number 12    Number of Visits 17    Date for SLP Re-Evaluation 12/31/22    SLP Start Time 1405    SLP Stop Time  1441    SLP Time Calculation (min) 36 min    Activity Tolerance Patient tolerated treatment well                  Past Medical History:  Diagnosis Date   Bradycardia    Second degree AV block    Stroke [facility])    Past Surgical History:  Procedure Laterality Date   BIV UPGRADE N/A 11/08/2022   Procedure: BIVI PACEMAKER UPGRADE;  Surgeon: Maurice Small, MD;  Location: [facility] INVASIVE CV LAB;  Service: Cardiovascular;  Laterality: N/A;   BUBBLE STUDY  08/13/2022   Procedure: BUBBLE STUDY;  Surgeon: Christell Constant, MD;  Location: [facility] ENDOSCOPY;  Service: Cardiovascular;;   PACEMAKER IMPLANT N/A 12/20/2019   Procedure: PACEMAKER IMPLANT;  Surgeon: Hillis Range, MD;  Location: [facility] INVASIVE CV LAB;  Service: Cardiovascular;  Laterality: N/A;   RIGHT/LEFT HEART CATH AND CORONARY ANGIOGRAPHY N/A 09/28/2022   Procedure: RIGHT/LEFT HEART CATH AND CORONARY ANGIOGRAPHY;  Surgeon: Laurey Morale, MD;  Location: [facility] INVASIVE CV LAB;  Service: Cardiovascular;  Laterality: N/A;   TEE WITHOUT CARDIOVERSION N/A 08/13/2022   Procedure: TRANSESOPHAGEAL ECHOCARDIOGRAM (TEE);  Surgeon: Christell Constant, MD;  Location: [facility] ENDOSCOPY;  Service: Cardiovascular;  Laterality: N/A;   Patient Active Problem List   Diagnosis Date Noted   Heart failure, type unknown (HCC) 09/06/2022   Cardiomyopathy (HCC) 08/13/2022   Cerebrovascular accident (CVA) due to embolism of precerebral artery (HCC) 08/13/2022   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 But not where I was at before." (Pt, re: reading speed) Pt accompanied by: significant other wife Luke Jimenez  PERTINENT HISTORY: Pt is a 57 y.o. male who presented to ED with complaints of nonspecific complaints of feeling off Pt family concerned that he was having difficulty with speaking over the phone. Also was having difficulty finding words with forgetfulness. Pt had slight headache yesterday but no other symptoms. CT head (08/09/22) showed hypodensity in the left temporal lobe suspicious for acute infarct. MRI brain (08/11/22) revealed "Acute left PCA territory infarcts". EEG (08/12/22) "suggestive of cortical dysfunction arising from left temporal region likely secondary to underlying stroke, post-ictal state. No further seizures were noted". PMH: second-degree AV block with pacemaker  PAIN: Are you having pain? No  FALLS: Has patient fallen in last 6 months?  No  PATIENT GOALS: Improve reading to be able to return to work  OBJECTIVE:   DIAGNOSTIC FINDINGS:  SLE 08/12/22: Assessment / Plan / Recommendation Clinical Impression     Pt presents with  primarily cognitive communication deficits, with very few word finding difficulites and likely visual recognition deficits. He is oriented fully to self and place with difficulty recalling year (1994) and reason for hospitalization. Immediate recall of listed items and perfomance with simple mathematical calculation were [facility]. Cognitive deificts noted were in the  areas of delayed recall (2/5 items recalled), complex verbal problem solving and executive function (inferencing, self-monitoring/correcting). During informal conversation, no word finding or expressive language difficulties noted. Perseveration/hesistation exhibited during confrontational naming task in x1 instance. Receptive language appeared [facility] for tasks provided. During reading (words/sentences) and picture scene task, pt with difficulty recognizing/discriminating few pictured objects/words. Some hesitations evident during reading task and pt reports no issues with visual acuity, but with some delay in recognizing word. Would need to r/o any other visual deficits to determine if solely a recognition/discrimination issue. Recommend SLP services f/u acutely to treat primarily for cognitive communication function. Ongoing assessment/ dx treatment for language function also warranted. Wife present in room/via phone for majority of session is in agreement with recommendations.       PATIENT REPORTED OUTCOME MEASURES (PROM): Pt has not been provided with PROM as of 11-29-22.   TODAY'S TREATMENT:                                                                                                                                         DATE:  11/29/22:Pt thinking clearer and faster now than prior to heart sx. Feels processing skills are closer to baseline. Uncommon words or new words (he is reading a fantasy novel now) are more difficult but the occurrence of difficulty is much less according to pt. He understands what he is reading. Pt and wife agree pt may be at the state with reading where he is doing too well to require therapy but not at baseline. Rolen thinks he will need to incr the compensations for short term memory at work. "I just need to jumpin the pool at this point and see what I need," pt stated re: work compensations.  Extended discussion about plan with ST at this time. Pt and wife would like to  schedule another appointment in approx 2-4 weeks to ensure cont'd progress.   10/25/22: Pt is reading an older English novel and is using compensations from last few sessions as directed. SLP discussed how pt is going to compensate for short term memory. Pt remarked that he will need to ask boss for written tasks/modifications. SLP and pt agreed that he will need to prioritize tasks. He has done some of this prioritization already prior to CVA.  Pt and SLP agreed taking a longer break from ST will be nice to see if pt cont to progress with reading or stays the same over the time until his next appt the first week in July.   10/21/22: Reports slow progress with ongoing reduced processing speed, decreased short term  recall, and ongoing reading challenges, although some improvement reported. Continues reading aloud as part of HEP. Indicated increased difficulty understanding context when reading aloud and decoding multi-syllabic words if not spelled phonetically. Educated and instructed reading strategies to aid lexical contexts, such as reading silently for context prior to reading aloud, writing down errored words and re-reading, and using self-talk to slow thought processing down and aid attention (spelling aloud, saying next step). Pt completed unscrambling words/sentences task with mod I given task naturally slowed his rate and provided context (underlined or capitalized first letter). Provided as part of HEP. Pt and wife expressed appreciation for recommendations and SLP team providing multiple perspectives.   10/15/22: Since last session, pt kept his place with lining up the digital text with the top line and this has been successful as a place marker to incr attention. Pt has not used "speak" function on the iPhone yet. Slp told pt to get some Cobal text over the weekend and see if he can understand it. In multiple paragraph selections, pt took extra time to read and answered questions 100% with looking back  at text 20% of the time.  10/13/22: Pt stated his reading was improving and now he is only having difficulty with long/er words.SLP educated pt on speak function with iOS today with text messages. Today pt read a 7 sentence text message to SLP with mild and rare hesitation/pausing. Pt stated, "It seems sometime I'm missing the first letters of the words" and SLP told pt it appears that this would be a sx of decr'd attention to task when reading and reminded pt of compensatory strategies of using his finger or a straightedge to support his attention when reading.  10/07/22: SLP to provide pt with PROM next session. SLP worked with pt reading using his memory strategy handout rec'd in OT. Pt with hesitation with multisyllable words and SLP had pt implement technique discussed last session. Pt and wife had not had time to work on reading due to family plans the previous two days. Pt made errors reading on functor words and req'd SLP cues for awareness.   10/04/21: SLP suggested pt and wife do more reading homework with the suggestions by SLP implemented (see pt instructions). Pt and wife will complete reading homework together for 25-30 minutes, x5 days/week.  SLP asked pt to think about how these deficits would impact him at work and pt stated he would need to write more notes for recall than he did prior to CVA. Pt does not know how reading deficit will affect him at work due to [employer] being another language, with other context cues.  09/30/22: Further targeted and analyzed reading today. Pt endorsed some reduced word recognition, particularly with consonant clusters and multisyllabic words. Targeted oral reading of Rainbow passage. SLP noted intermittent delays with more common sight words (ex: white, beautiful) as well as possible phonemic paraphasias (ex: when/then, universal/unusual, bright/bridge). Only self-corrected error x1. Encouraged metacognitive analysis of reading ability, in which pt identified not  attending to entire word resulting in errors. Recommended visual supports (finger, visual marker) to aid attention and processing speed. Targeted minimal pairs, in which pt required some extended time and occasional mod A to correct errors. Recommended functional reading at home to aid word recognition.   09/27/22: Addressed reading with use of compensations to aid in attention. Pt benefiting from oral reading, aiding in recall of x7 pertinent details from paragraph level stimuli, in spite of occasional disruption in reading fluency. Provided education  on use of text to speech settings on apple products to aid in reading practice. Recommend reading with support, then returning and re-reading IND to aid in increased fluency. Pt to trial reading aloud to optimize attention and recall of read material.   09/23/22: Pt and wife and SLP talked about pt's cognitive deficits - pt and wife agreed pt's overall processing is slower than before CVA. SLP could initiate (or complete) CLQT with pt next session. [place] also endorses some deficits in higher level attention ("I just have to do one thing and then do the other thing - I can't do them together anymore."). With extra time allowed, pt showed good (not excellent) insight what he might need to tell his supervisor if he were told to return to work this week. During this discussion pt indicated he was more tired now than pre-CVA. 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 SLP continued the Reading Comprehenstion Battery of Aphasia - 2nd Ed. (RCBA-2); SLP-Katesha Eichel to complete the last subtest the week of 10-04-22.  09/14/22: SLP initiated Reading Comprehenstion Battery of Aphasia - 2nd Ed. (RCBA-2). 8/10 subtests completed.   Pt with incr'd time necessary for all subtests. Single words - choice of f:3 words to match to pic took approx 13 seconds per picture. SLP to cont assessment next session. SLP  told pt to continue to read at home - pt states he is getting good practice reading text messages.   09/08/22: Suggested home tasks for pt and wife  PATIENT EDUCATION: Education details: Results of eval, therapy course, home tasks Person educated: Patient and Spouse Education method: Explanation, Demonstration, and Verbal cues Education comprehension: verbalized understanding, returned demonstration, verbal cues required, and needs further education   GOALS: Goals reviewed with patient? No  SHORT TERM GOALS: Target date: 10/14/22  Pt will read 8-10 word sentence selections and indicate correct answers to written or spoken questions 100% success with extra time in 3 sessions Baseline: Goal status: MET  2.  Pt will demo understanding of mod complex emails of 10+ sentences with 100% success with compensations, in three sessions Baseline:  Goal status: MET  3.  Pt will write semi-technical 10+ sentence (work-like) messages and/or text messages with 100% success in 3 sessions Baseline:  Goal status: NOT MET - in LTGs  4.  Pt will undergo cognitive linguistic testing if clinically necessary Baseline:  Goal status: DEFERRED   LONG TERM GOALS: Target date: 11/16/22  Pt will have higher/better score on PROM compared to initial reading Baseline:  Goal status: IN PROGRESS  2.  Pt will read 15+ sentence mod complex selections with compensations in 3 sessions Baseline: 10/15/22 Goal status: IN PROGRESS  3.  Pt will write semi-technical 8+ sentence (work-like) messages and/or text messages with 100% success in 3 sessions Baseline:  Goal status: REVISED (from STGs)  4.  Pt will write 12-sentence selections (work like emails, texts, etc)  with compensations in 3 sessions Baseline:  Goal status: REVISED   ASSESSMENT:  CLINICAL IMPRESSION: Patient is a 57 y.o. male who was seen today for treatment of cognitive linguistics and with reading in light of CVAs in March. See "today's  treatment" section for additional details. Pt's OT stated pt demonstrated cognitive deficits such as processing/mental calculation, and memory. Decided SLP will focus on reading and attention in reading, with OT targeting general cognition. 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    OBJECTIVE IMPAIRMENTS: include memory, expressive language, and aphasia. These impairments are limiting patient from return to work, ADLs/IADLs, and effectively communicating at home and in community. Factors affecting potential to achieve goals and functional outcome are ability to learn/carryover information. Patient will benefit from skilled SLP services to address above impairments and improve overall function.  REHAB POTENTIAL: Good  PLAN:  SLP FREQUENCY: 2x/week  SLP DURATION: 8 weeks  PLANNED INTERVENTIONS: Language facilitation, Environmental controls, Cueing hierachy, Cognitive reorganization, Internal/external aids, Functional tasks, Multimodal communication approach, SLP instruction and feedback, Compensatory strategies, and Patient/family education    [employer], CCC-SLP 11/30/2022, 9:10 AM

## 2022-11-30 ENCOUNTER — Encounter: Payer: No Typology Code available for payment source | Admitting: Occupational Therapy

## 2022-11-30 NOTE — Patient Instructions (Signed)
   After Your Pacemaker   Monitor your pacemaker site for redness, swelling, and drainage. Call the device clinic at [phone] if you experience these symptoms or fever/chills.  Your incision was closed with Steri-strips or staples:  You may shower 7 days after your procedure and wash your incision with soap and water. Avoid lotions, ointments, or perfumes over your incision until it is well-healed.  You may use a hot tub or a pool after your wound check appointment if the incision is completely closed.  Do not lift, push or pull greater than 10 pounds with the affected arm until 6 weeks after your procedure. UNTIL AFTER [facility]. There are no other restrictions in arm movement after your wound check appointment.  You may drive, unless driving has been restricted by your healthcare provider.   Remote monitoring is used to monitor your pacemaker from home. This monitoring is scheduled every 91 days by our office. It allows [place] to keep an eye on the functioning of your device to ensure it is working properly. You will routinely see your Electrophysiologist annually (more often if necessary).

## 2022-12-01 ENCOUNTER — Ambulatory Visit: Payer: No Typology Code available for payment source | Attending: Cardiology

## 2022-12-01 DIAGNOSIS — I442 Atrioventricular block, complete: Secondary | ICD-10-CM | POA: Diagnosis not present

## 2022-12-01 LAB — CUP PACEART INCLINIC DEVICE CHECK
Battery Remaining Longevity: 90 mo
Battery Voltage: 3.17 V
Brady Statistic AP VP Percent: 0.71 %
Brady Statistic AP VS Percent: 0.01 %
Brady Statistic AS VP Percent: 99.16 %
Brady Statistic AS VS Percent: 0.12 %
Brady Statistic RA Percent Paced: 0.74 %
Brady Statistic RV Percent Paced: 99.87 %
Date Time Interrogation Session: 20240717132321
Implantable Lead Connection Status: 753985
Implantable Lead Connection Status: 753985
Implantable Lead Connection Status: 753985
Implantable Lead Implant Date: 20210805
Implantable Lead Implant Date: 20210805
Implantable Lead Implant Date: 20240624
Implantable Lead Location: 753858
Implantable Lead Location: 753859
Implantable Lead Location: 753860
Implantable Lead Model: 3830
Implantable Lead Model: 4598
Implantable Lead Model: 5076
Implantable Pulse Generator Implant Date: 20240624
Lead Channel Impedance Value: 361 Ohm
Lead Channel Impedance Value: 361 Ohm
Lead Channel Impedance Value: 418 Ohm
Lead Channel Impedance Value: 456 Ohm
Lead Channel Impedance Value: 456 Ohm
Lead Channel Impedance Value: 456 Ohm
Lead Channel Impedance Value: 513 Ohm
Lead Channel Impedance Value: 513 Ohm
Lead Channel Impedance Value: 570 Ohm
Lead Channel Impedance Value: 684 Ohm
Lead Channel Impedance Value: 703 Ohm
Lead Channel Impedance Value: 703 Ohm
Lead Channel Impedance Value: 779 Ohm
Lead Channel Impedance Value: 798 Ohm
Lead Channel Pacing Threshold Amplitude: 0.5 V
Lead Channel Pacing Threshold Amplitude: 1.25 V
Lead Channel Pacing Threshold Amplitude: 1.375 V
Lead Channel Pacing Threshold Amplitude: 1.5 V
Lead Channel Pacing Threshold Amplitude: 1.5 V
Lead Channel Pacing Threshold Pulse Width: 0.4 ms
Lead Channel Pacing Threshold Pulse Width: 0.4 ms
Lead Channel Pacing Threshold Pulse Width: 0.4 ms
Lead Channel Pacing Threshold Pulse Width: 0.4 ms
Lead Channel Pacing Threshold Pulse Width: 0.4 ms
Lead Channel Sensing Intrinsic Amplitude: 2.75 mV
Lead Channel Sensing Intrinsic Amplitude: 4.125 mV
Lead Channel Sensing Intrinsic Amplitude: 7.75 mV
Lead Channel Setting Pacing Amplitude: 1.5 V
Lead Channel Setting Pacing Amplitude: 2.75 V
Lead Channel Setting Pacing Amplitude: 3.5 V
Lead Channel Setting Pacing Pulse Width: 0.4 ms
Lead Channel Setting Pacing Pulse Width: 0.4 ms
Lead Channel Setting Sensing Sensitivity: 1.2 mV
Zone Setting Status: 755011
Zone Setting Status: 755011

## 2022-12-01 NOTE — Progress Notes (Signed)
Wound check appointment. Steri-strips removed. Wound without redness or edema. Incision edges approximated, wound well healed. Normal device function. Thresholds, sensing, and impedances consistent with implant measurements. Device programmed at appropriate safety margins.  Histogram distribution appropriate for patient and level of activity. AT/AF burden <0.1%, 3 AT/AF events all appear AT, longest . No high ventricular rates noted. Patient educated about wound care, arm mobility, lifting restrictions. ROV in 3 months with implanting physician.

## 2022-12-09 ENCOUNTER — Encounter (HOSPITAL_COMMUNITY): Payer: No Typology Code available for payment source | Admitting: Cardiology

## 2022-12-14 ENCOUNTER — Encounter: Payer: Self-pay | Admitting: Occupational Therapy

## 2022-12-20 ENCOUNTER — Ambulatory Visit: Payer: No Typology Code available for payment source

## 2022-12-21 ENCOUNTER — Ambulatory Visit: Payer: No Typology Code available for payment source | Attending: Family Medicine | Admitting: Occupational Therapy

## 2022-12-21 ENCOUNTER — Ambulatory Visit: Payer: No Typology Code available for payment source

## 2022-12-21 DIAGNOSIS — I69318 Other symptoms and signs involving cognitive functions following cerebral infarction: Secondary | ICD-10-CM | POA: Insufficient documentation

## 2022-12-21 DIAGNOSIS — R480 Dyslexia and alexia: Secondary | ICD-10-CM

## 2022-12-21 DIAGNOSIS — R41840 Attention and concentration deficit: Secondary | ICD-10-CM | POA: Diagnosis present

## 2022-12-21 DIAGNOSIS — R41841 Cognitive communication deficit: Secondary | ICD-10-CM | POA: Insufficient documentation

## 2022-12-21 DIAGNOSIS — R4701 Aphasia: Secondary | ICD-10-CM

## 2022-12-21 NOTE — Therapy (Signed)
OUTPATIENT OCCUPATIONAL THERAPY NEURO Treatment Note  Patient Name: Luke Jimenez MRN: [id number] DOB:13-Dec-1965, 57 y.o., male Today's Date: 12/21/2022  PCP: Shon Hale, MD REFERRING PROVIDER: Meredeth Ide, MD  END OF SESSION:             Past Medical History:  Diagnosis Date   Bradycardia    Second degree AV block    Stroke [facility])    Past Surgical History:  Procedure Laterality Date   BIV UPGRADE N/A 11/08/2022   Procedure: BIVI PACEMAKER UPGRADE;  Surgeon: Maurice Small, MD;  Location: [facility] INVASIVE CV LAB;  Service: Cardiovascular;  Laterality: N/A;   BUBBLE STUDY  08/13/2022   Procedure: BUBBLE STUDY;  Surgeon: Christell Constant, MD;  Location: [facility] ENDOSCOPY;  Service: Cardiovascular;;   PACEMAKER IMPLANT N/A 12/20/2019   Procedure: PACEMAKER IMPLANT;  Surgeon: Hillis Range, MD;  Location: [facility] INVASIVE CV LAB;  Service: Cardiovascular;  Laterality: N/A;   RIGHT/LEFT HEART CATH AND CORONARY ANGIOGRAPHY N/A 09/28/2022   Procedure: RIGHT/LEFT HEART CATH AND CORONARY ANGIOGRAPHY;  Surgeon: Laurey Morale, MD;  Location: [facility] INVASIVE CV LAB;  Service: Cardiovascular;  Laterality: N/A;   TEE WITHOUT CARDIOVERSION N/A 08/13/2022   Procedure: TRANSESOPHAGEAL ECHOCARDIOGRAM (TEE);  Surgeon: Christell Constant, MD;  Location: [facility] ENDOSCOPY;  Service: Cardiovascular;  Laterality: N/A;   Patient Active Problem List   Diagnosis Date Noted   Heart failure, type unknown (HCC) 09/06/2022   Cardiomyopathy (HCC) 08/13/2022   Cerebrovascular accident (CVA) due to embolism of precerebral artery (HCC) 08/13/2022   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 Pt accompanied by: self and significant other (spouse [name])  PERTINENT HISTORY: Pt is a 57 y.o. male who presented 08/09/22 with speech difficulty and AMS. 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 EEG also showing seizures. PMH: second-degree AV block with pacemaker, bradycardia  PRECAUTIONS: None  WEIGHT BEARING RESTRICTIONS: No  PAIN:  Are you having pain? No  FALLS: Has patient fallen in last 6 months? No  LIVING ENVIRONMENT: Lives with: lives with their spouse and family nearby Lives in: House/apartment Stairs: Yes: Internal: bedroom/bathroom up full flight of steps; and External: 4-5 steps Has following equipment at home: Grab bars and built in shower seat, hand held shower head  PLOF: Independent, Independent with basic ADLs, and Vocation/Vocational requirements: Quarry manager   PATIENT GOALS: to improve in reading and short term memory  OBJECTIVE:   HAND DOMINANCE: Right  IADLs: Shopping: has gone to store with spouse, with ability to walk away to retreive an item and return to her  Meal Prep: microwave to warm up leftovers, but did not really cook before [employer] mobility: not cleared to drive Medication management: spouse is Control and instrumentation engineer: spouse takes care of and did before  COGNITION: Overall cognitive status:  Impaired with significant impairments to time orientation, sequencing, attention, and short term memory/recall  VISION: Subjective report: no changes  VISION ASSESSMENT:  To be further assessed in functional context While in hospital, OT noted questionable R inattention, to be further assessed in  subsequent visits  Patient has difficulty with following activities due to following visual impairments: reading - unsure if attention, memory, or visual  TODAY'S TREATMENT:                                    12/21/22 STM: reprots it is still a challenge, but that he is beginning to use "work arounds" and having to take notes.   , sequencing,  RTW discussion. 6 mod barriers, 10 low barriers, 1 possible high barrier.  Discussed computer system updates and changes since out. ***Thinking drains his energy Have you gotten setup and begun Lumosity app? Review return to work recommendations/have you filled out/discussed RTW form Reviwed making lists and step by step for new systems to jog recall Pill box assessment 4:15, no errors organizing pill bottles prior to administering.  Also told story during task.   11/29/22 Engaged in lengthy discussion about progress s/p cardiac surgery and improvements with working memory and sequencing.  Pt reports "holes" are still in short term memory, however it is better s/p surgery.  Pt does recognize that he will need to continue to write notes to aid in recall of more detailed tasks.  Pt reports that he feels like his family was helping "quite a bit more" vs now they are helping him "a little bit".  OT educated on lumosity app for aspects of memory and dual tasking.  Pt reports that he was aware of the app but had not completely set it up yet.  Engaged in discussion of progress towards goals with pt demonstrating improvements in engagement in IADLs with simple meal prep and successful navigation of airport and packing for trip requiring dual tasking in distracting environment.  Engaged in discussion about return to work recommendations and provided pt and spouse with Return to Work Readiness checklist from [employer].  Discussed options for modifying work schedule/work day as needed once cleared by neurologist and recommendations to request from  supervisor as needed to facilitate more seamless transition back to work once cleared.     10/21/22 Memory: Pt's spouse reports that pt continues to ask same questions repeatedly during day/week and/or repeating himself with decreased or no recollection.  OT educated on recommendation to stop what you are doing to register the response and even repeat the response/write it down to increase carryover/recall.  Educated on importance of engagement and sustained attention when asking and receiving response.  Discussed compensatory strategies of writing things down, utilizing voice apps to allow quicker exchange.  Reiterated education on being present and engaged with task and decreasing external stimuli to facilitate increased carryover/recall of information. Awareness: Pt is demonstrating increased recognition of situations, (e.g. Pt reports that he will be heading somewhere to address something and 2nd thought will come in to where he either forgets first thought or will forget 2nd if he continues on with 1st) whereas he would not even recall situation at all.  Reiterated compensatory strategies to facilitate increased awareness/attention with decreasing external stimuli.  Pt reports that he has noticed a slowing down of his speed with tasks such as reading, allowing for increased processing or recognition with reading.    10/19/22 Dual tasking: engaged in education on dual tasking with focus on increased awareness of importance of honing one task and  then adding distractions as appropriate to further challenge attention.  Pt continues to demonstrate selective and intermittent alternating attention at this point. Memory: reviewed routine tasks where pt continues to struggle with recall, particularly when it comes to location of household items.  Pt reports that pt would aid in laundry tasks prior to CVA and now will ask where particular towels go as he does not recall location of certain items.  OT educated on  importance of attention to task during completion of task to attempt to promote carryover of information.   List making: OT challenged pt to make a list of items needed to pack for upcoming trip.  Reviewed use of list making as memory strategy and ability to update and modify list as he goes.  Encouraged pt to begin his list for trip this week to allow for time to recall forgotten items.  Pt demonstrating increased spelling errors when attempting to complete with alternating attention vs sustained/selective attention.    PATIENT EDUCATION: Education details: ongoing condition specific education and use of lists and/or voice notes on phone for recall Person educated: Patient and Spouse Education method: Explanation Education comprehension: verbalized understanding and needs further education  HOME EXERCISE PROGRAM: TBD   GOALS: Goals reviewed with patient? Yes  SHORT TERM GOALS: Target date: 10/08/22  Pt will verbalize understanding of task modifications and/or potential A/E needs to increase ease, safety, and independence w/ ADLs and IADLs. Baseline:  Goal status: MET - 10/07/22  2.  Pt will be able to complete simulated visual scanning activity w/ at least 95% accuracy (finding targets around therapy gym, searching for items on shelves), incorporating visual and memory compensatory strategies prn Baseline:  Goal status: IN PROGRESS  3.  Pt will demonstrate improved memory, sequencing, and problem solving as demonstrated by improvements in score on pill box assessment by ability to complete in 5 mins time limit with </= to 3 errors. Baseline:  Goal status: IN PROGRESS  4.   Pt will complete table top scanning activity with Supervision, min cues for recall of instructions, sequencing during task. Baseline:  Goal status: MET - 10/05/22  5.   Pt will be able to demonstrate ability to alternate attention for 5 mins during table top task. Baseline:  Goal status: NOT MET -  10/07/22  LONG TERM GOALS: Target date: 11/05/22  Pt will demonstrate ability to sequence simple functional task (simple snack prep, laundry task, etc) at Mod I level with good safety awareness and ability to utilize memory strategies for recall. Baseline:  Goal status: MET - 11/29/22  2.  Pt will navigate a moderately busy environment, completing dual task activity and/or following multi-step commands with 90% accuracy Baseline:  Goal status: MET - 11/29/22  3.  Pt will complete simulated medication management activity w/ Mod Ind by discharge, incorporating compensatory strategies/AE prn Baseline:  Goal status: NOT MET  4.  Pt will be independent with utilization of memory strategies to allow for increased independence with managing personal appointments. Baseline:  Goal status: NOT MET  5.  Pt will be able to demonstrate ability to divide attention for 5 mins during functional task. Baseline:  Goal status: MET - 11/29/22  LONG TERM GOALS: Target date: 12/30/22   1.  Pt will complete simulated medication management activity w/ Mod Ind by discharge, incorporating compensatory strategies/AE prn Baseline:  Goal status: MET - 12/21/22  2.  Pt will be independent with utilization of memory strategies to allow for increased independence with managing  personal appointments. Baseline:  Goal status: MET - 12/21/22  3.  Pt will report understanding of return to work readiness and adaptations recommended.    Baseline:  Goal status: MET - 12/21/22     ASSESSMENT:  CLINICAL IMPRESSION: Pt has made tremendous progress since last seen for OT.  Pt is demonstrating improved recall, working memory, and Education officer, environmental.  Pt attributes improvements to increased heart function s/p surgery.  Pt asking questions about return to work and strategies to continue to improve memory.  OT discussed lumosity app and provided examples of activities in app as well as providing pt and spouse with Return to Work  readiness handout with plans to go through and assess areas of deficiency that may need accommodations as return to work becomes more imminent.  Pt will benefit from about 2 more visits to continue to focus on remaining POC and transition to tasks he can continue to complete either with SLP or at home.  PERFORMANCE DEFICITS: in functional skills including IADLs, endurance, cardiopulmonary status limiting function, decreased knowledge of precautions, and decreased knowledge of use of DME, cognitive skills including attention, memory, orientation, problem solving, and sequencing, and psychosocial skills including coping strategies, environmental adaptation, and routines and behaviors.   IMPAIRMENTS: are limiting patient from IADLs, work, and social participation.   CO-MORBIDITIES: may have co-morbidities  that affects occupational performance. Patient will benefit from skilled OT to address above impairments and improve overall function.  MODIFICATION OR ASSISTANCE TO COMPLETE EVALUATION: Min-Moderate modification of tasks or assist with assess necessary to complete an evaluation.  OT OCCUPATIONAL PROFILE AND HISTORY: Problem focused assessment: Including review of records relating to presenting problem.  CLINICAL DECISION MAKING: Moderate - several treatment options, min-mod task modification necessary  REHAB POTENTIAL: Good  EVALUATION COMPLEXITY: Moderate    PLAN:  OT FREQUENCY: 1-2x/week  OT DURATION: 8 weeks  PLANNED INTERVENTIONS: self care/ADL training, therapeutic activity, neuromuscular re-education, functional mobility training, patient/family education, cognitive remediation/compensation, visual/perceptual remediation/compensation, psychosocial skills training, energy conservation, and coping strategies training  RECOMMENDED OTHER SERVICES: SLP   CONSULTED AND AGREED WITH PLAN OF CARE: Patient and family member/caregiver  PLAN FOR NEXT SESSION: STM, sequencing, RTW  discussion.   Orly Quimby, OTR/L 12/21/2022, 1:25 PM

## 2022-12-21 NOTE — Patient Instructions (Signed)
   You could listen to ~20-minute audio info (e.g., Ted Talks) and take notes, or find what compensation works best for you in prep for meetings.  Continue to read books that interest you for practice what less-common words look like/are spelled like for your brain to refamiliarize itself.

## 2022-12-21 NOTE — Therapy (Signed)
OUTPATIENT SPEECH LANGUAGE PATHOLOGY TREATMENT/DISCHARGE   Patient Name: Luke Jimenez MRN: [id number] DOB:04/05/1966, 57 y.o., male Today's Date: 12/21/2022  PCP: Shon Hale MD (Documentation) REFERRING PROVIDER: Meredeth Ide, MD  END OF SESSION:  End of Session - 12/21/22 1639     Visit Number 13    Number of Visits 17    Date for SLP Re-Evaluation 12/31/22    SLP Start Time 1532    SLP Stop Time  1615    SLP Time Calculation (min) 43 min    Activity Tolerance Patient tolerated treatment well                   Past Medical History:  Diagnosis Date   Bradycardia    Second degree AV block    Stroke [facility])    Past Surgical History:  Procedure Laterality Date   BIV UPGRADE N/A 11/08/2022   Procedure: BIVI PACEMAKER UPGRADE;  Surgeon: Maurice Small, MD;  Location: [facility] INVASIVE CV LAB;  Service: Cardiovascular;  Laterality: N/A;   BUBBLE STUDY  08/13/2022   Procedure: BUBBLE STUDY;  Surgeon: Christell Constant, MD;  Location: [facility] ENDOSCOPY;  Service: Cardiovascular;;   PACEMAKER IMPLANT N/A 12/20/2019   Procedure: PACEMAKER IMPLANT;  Surgeon: Hillis Range, MD;  Location: [facility] INVASIVE CV LAB;  Service: Cardiovascular;  Laterality: N/A;   RIGHT/LEFT HEART CATH AND CORONARY ANGIOGRAPHY N/A 09/28/2022   Procedure: RIGHT/LEFT HEART CATH AND CORONARY ANGIOGRAPHY;  Surgeon: Laurey Morale, MD;  Location: [facility] INVASIVE CV LAB;  Service: Cardiovascular;  Laterality: N/A;   TEE WITHOUT CARDIOVERSION N/A 08/13/2022   Procedure: TRANSESOPHAGEAL ECHOCARDIOGRAM (TEE);  Surgeon: Christell Constant, MD;  Location: [facility] ENDOSCOPY;  Service: Cardiovascular;  Laterality: N/A;   Patient Active Problem List   Diagnosis Date Noted   Heart failure, type unknown (HCC) 09/06/2022   Cardiomyopathy (HCC) 08/13/2022   Cerebrovascular accident (CVA) due to embolism of precerebral artery (HCC) 08/13/2022   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 Pt states he approximates he is at 85-90% of his baseline with reading.   Remaining deficits: Mild alexia/reading aphasia. Memory deficit.   Education / Equipment: See notes   Patient agrees to discharge. Patient goals were partially met. Patient is being discharged due to being pleased with the current functional level..     ONSET DATE: 08/09/22   REFERRING DIAG:  I63.10 (ICD-10-CM) - Cerebrovascular accident (CVA) due to embolism of precerebral artery    THERAPY DIAG:  Dyslexia and alexia  Cognitive communication deficit  Aphasia  Rationale for Evaluation and Treatment: Rehabilitation  SUBJECTIVE:   SUBJECTIVE STATEMENT: "I think I'll need to use the memory compensations when I go back to work." "I'm definitely reading faster now." Pt accompanied by: significant other wife Luke Jimenez  PERTINENT HISTORY: Pt is a 57 y.o. male who presented to ED with complaints of nonspecific complaints of feeling off Pt family concerned that he was having difficulty with speaking over the phone. Also was having difficulty finding words with forgetfulness. Pt had slight headache yesterday but no other symptoms. CT head (08/09/22) showed hypodensity in the left temporal lobe suspicious for acute infarct. MRI brain (08/11/22) revealed "Acute left PCA territory infarcts".  EEG (08/12/22) "suggestive of cortical dysfunction arising from left temporal region likely secondary to underlying stroke, post-ictal state. No further seizures were noted". PMH: second-degree AV block with pacemaker  PAIN: Are you having pain? No  FALLS: Has patient fallen in last 6 months?  No  PATIENT GOALS: Improve reading to be able  to return to work  OBJECTIVE:   DIAGNOSTIC FINDINGS:  SLE 08/12/22: Assessment / Plan / Recommendation Clinical Impression     Pt presents with primarily cognitive communication deficits, with very few word finding difficulites and likely visual recognition deficits. He is oriented fully to self and place with difficulty recalling year (1994) and reason for hospitalization. Immediate recall of listed items and perfomance with simple mathematical calculation were [facility]. Cognitive deificts noted were in the areas of delayed recall (2/5 items recalled), complex verbal problem solving and executive function (inferencing, self-monitoring/correcting). During informal conversation, no word finding or expressive language difficulties noted. Perseveration/hesistation exhibited during confrontational naming task in x1 instance. Receptive language appeared [facility] for tasks provided. During reading (words/sentences) and picture scene task, pt with difficulty recognizing/discriminating few pictured objects/words. Some hesitations evident during reading task and pt reports no issues with visual acuity, but with some delay in recognizing word. Would need to r/o any other visual deficits to determine if solely a recognition/discrimination issue. Recommend SLP services f/u acutely to treat primarily for cognitive communication function. Ongoing assessment/ dx treatment for language function also warranted. Wife present in room/via phone for majority of session is in agreement with recommendations.       PATIENT REPORTED OUTCOME MEASURES (PROM): Pt has not been provided with PROM as of 11-29-22. A standardized reading PROM was not found. He indicates he is at 85-90% of baseline on 12/21/22.    TODAY'S TREATMENT:                                                                                                                                         DATE:  12/21/22: Processing and thinking speed faster than 11/29/22. Wife  agrees. Pt has continued to read regularly since last session. Reading today- intermittent difficulty with certain uncommon words pt has not read before/has not read in a very long time. Examples today: "chuck" meaning a key to loosen drill bits, and "caddies" meaning containers to place items in to transport them; Pt had notable difficulty with these. He postulates he is at 85-90% of his baseline at this time. Pt took slightly longer than expected time to read 12-15 sentence selection, x2, but comprehension was WNL. Siraj wrote 9-sentence detailed/technical information today without error and in WNL time frame. He and SLP agree that at this time he has progressed as much as he can via formal ST and will cont to work on his own at home to regain what skill he can with reading. He will also cont to work with listening and note-taking for ~  20-minute audio information to practice writing for note taking in meetings/like situations.   11/29/22:Pt thinking clearer and faster now than prior to heart sx. Feels processing skills are closer to baseline. Uncommon words or new words (he is reading a fantasy novel now) are more difficult but the occurrence of difficulty is much less according to pt. He understands what he is reading. Pt and wife agree pt may be at the state with reading where he is doing too well to require therapy but not at baseline. Luke Jimenez thinks he will need to incr the compensations for short term memory at work. "I just need to jumpin the pool at this point and see what I need," pt stated re: work compensations.  Extended discussion about plan with ST at this time. Pt and wife would like to schedule another appointment in approx 2-4 weeks to ensure cont'd progress.   10/25/22: Pt is reading an older English novel and is using compensations from last few sessions as directed. SLP discussed how pt is going to compensate for short term memory. Pt remarked that he will need to ask boss for written  tasks/modifications. SLP and pt agreed that he will need to prioritize tasks. He has done some of this prioritization already prior to CVA.  Pt and SLP agreed taking a longer break from ST will be nice to see if pt cont to progress with reading or stays the same over the time until his next appt the first week in July.   10/21/22: Reports slow progress with ongoing reduced processing speed, decreased short term recall, and ongoing reading challenges, although some improvement reported. Continues reading aloud as part of HEP. Indicated increased difficulty understanding context when reading aloud and decoding multi-syllabic words if not spelled phonetically. Educated and instructed reading strategies to aid lexical contexts, such as reading silently for context prior to reading aloud, writing down errored words and re-reading, and using self-talk to slow thought processing down and aid attention (spelling aloud, saying next step). Pt completed unscrambling words/sentences task with mod I given task naturally slowed his rate and provided context (underlined or capitalized first letter). Provided as part of HEP. Pt and wife expressed appreciation for recommendations and SLP team providing multiple perspectives.   10/15/22: Since last session, pt kept his place with lining up the digital text with the top line and this has been successful as a place marker to incr attention. Pt has not used "speak" function on the iPhone yet. Slp told pt to get some Cobal text over the weekend and see if he can understand it. In multiple paragraph selections, pt took extra time to read and answered questions 100% with looking back at text 20% of the time.  10/13/22: Pt stated his reading was improving and now he is only having difficulty with long/er words.SLP educated pt on speak function with iOS today with text messages. Today pt read a 7 sentence text message to SLP with mild and rare hesitation/pausing. Pt stated, "It seems  sometime I'm missing the first letters of the words" and SLP told pt it appears that this would be a sx of decr'd attention to task when reading and reminded pt of compensatory strategies of using his finger or a straightedge to support his attention when reading.  10/07/22: SLP to provide pt with PROM next session. SLP worked with pt reading using his memory strategy handout rec'd in OT. Pt with hesitation with multisyllable words and SLP had pt implement technique discussed last  session. Pt and wife had not had time to work on reading due to family plans the previous two days. Pt made errors reading on functor words and req'd SLP cues for awareness.   10/04/21: SLP suggested pt and wife do more reading homework with the suggestions by SLP implemented (see pt instructions). Pt and wife will complete reading homework together for 25-30 minutes, x5 days/week.  SLP asked pt to think about how these deficits would impact him at work and pt stated he would need to write more notes for recall than he did prior to CVA. Pt does not know how reading deficit will affect him at work due to [employer] being another language, with other context cues.  09/30/22: Further targeted and analyzed reading today. Pt endorsed some reduced word recognition, particularly with consonant clusters and multisyllabic words. Targeted oral reading of Rainbow passage. SLP noted intermittent delays with more common sight words (ex: white, beautiful) as well as possible phonemic paraphasias (ex: when/then, universal/unusual, bright/bridge). Only self-corrected error x1. Encouraged metacognitive analysis of reading ability, in which pt identified not attending to entire word resulting in errors. Recommended visual supports (finger, visual marker) to aid attention and processing speed. Targeted minimal pairs, in which pt required some extended time and occasional mod A to correct errors. Recommended functional reading at home to aid word recognition.    09/27/22: Addressed reading with use of compensations to aid in attention. Pt benefiting from oral reading, aiding in recall of x7 pertinent details from paragraph level stimuli, in spite of occasional disruption in reading fluency. Provided education on use of text to speech settings on apple products to aid in reading practice. Recommend reading with support, then returning and re-reading IND to aid in increased fluency. Pt to trial reading aloud to optimize attention and recall of read material.   09/23/22: Pt and wife and SLP talked about pt's cognitive deficits - pt and wife agreed pt's overall processing is slower than before CVA. SLP could initiate (or complete) CLQT with pt next session. Drevion also endorses some deficits in higher level attention ("I just have to do one thing and then do the other thing - I can't do them together anymore."). With extra time allowed, pt showed good (not excellent) insight what he might need to tell his supervisor if he were told to return to work this week. During this discussion pt indicated he was more tired now than pre-CVA. 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 SLP continued the Reading Comprehenstion Battery of Aphasia - 2nd Ed. (RCBA-2); SLP-January Bergthold to complete the last subtest the week of 10-04-22.  09/14/22: SLP initiated Reading Comprehenstion Battery of Aphasia - 2nd Ed. (RCBA-2). 8/10 subtests completed.   Pt with incr'd time necessary for all subtests. Single words - choice of f:3 words to match to pic took approx 13 seconds per picture. SLP to cont assessment next session. SLP told pt to continue to read at home - pt states he is getting good practice reading text messages.   09/08/22: Suggested home tasks for pt and wife  PATIENT EDUCATION: Education details: Results of eval, therapy course, home tasks Person educated: Patient and Spouse Education method: Explanation,  Demonstration, and Verbal cues Education comprehension: verbalized understanding, returned demonstration, verbal cues required, and needs further education   GOALS: Goals reviewed with patient? No  SHORT TERM GOALS: Target date: 10/14/22  Pt will read 8-10 word sentence  selections and indicate correct answers to written or spoken questions 100% success with extra time in 3 sessions Baseline: Goal status: MET  2.  Pt will demo understanding of mod complex emails of 10+ sentences with 100% success with compensations, in three sessions Baseline:  Goal status: MET  3.  Pt will write semi-technical 10+ sentence (work-like) messages and/or text messages with 100% success in 3 sessions Baseline:  Goal status: NOT MET - in LTGs  4.  Pt will undergo cognitive linguistic testing if clinically necessary Baseline:  Goal status: DEFERRED   LONG TERM GOALS: Target date: 11/16/22  Pt will have higher/better score on PROM compared to initial reading Baseline:  Goal status: DEFERRED for reason stated above  2.  Pt will read 15+ sentence mod complex selections with compensations in 3 sessions Baseline: 10/15/22, 12-21-22 Goal status: Partially met  3.  Pt will write semi-technical 8+ sentence (work-like) messages and/or text messages with 100% success in 3 sessions Baseline: 12/21/22 Goal status: Partially met   4.  Pt will write 12-sentence selections (work like emails, texts, etc)  with compensations in 3 sessions Baseline:  Goal status: Deferred   ASSESSMENT:  CLINICAL IMPRESSION: Patient is a 57 y.o. male who was seen today for treatment of cognitive linguistics and with reading in light of CVAs in March. See "today's treatment" section for additional details. Pt feels reading at this time is at 85-90% of baseline and he and SLP agree pt will cont to work at home to maximize progress at this time.    OBJECTIVE IMPAIRMENTS: include memory, expressive language, and aphasia. These  impairments are limiting patient from return to work, ADLs/IADLs, and effectively communicating at home and in community. Factors affecting potential to achieve goals and functional outcome are ability to learn/carryover information. Patient will benefit from skilled SLP services to address above impairments and improve overall function.  REHAB POTENTIAL: Good  PLAN: Discharge today.  PLANNED INTERVENTIONS: Language facilitation, Environmental controls, Cueing hierachy, Cognitive reorganization, Internal/external aids, Functional tasks, Multimodal communication approach, SLP instruction and feedback, Compensatory strategies, and Patient/family education    [facility], CCC-SLP 12/21/2022, 4:40 PM

## 2022-12-28 ENCOUNTER — Encounter (HOSPITAL_COMMUNITY): Payer: No Typology Code available for payment source | Admitting: Cardiology

## 2023-01-16 ENCOUNTER — Other Ambulatory Visit (HOSPITAL_COMMUNITY): Payer: Self-pay | Admitting: Cardiology

## 2023-01-18 ENCOUNTER — Ambulatory Visit (HOSPITAL_COMMUNITY)
Admission: RE | Admit: 2023-01-18 | Discharge: 2023-01-18 | Disposition: A | Discharge status: Home / Self Care | Payer: No Typology Code available for payment source | Source: Ambulatory Visit | Attending: Cardiology | Admitting: Cardiology

## 2023-01-18 ENCOUNTER — Encounter (HOSPITAL_COMMUNITY): Payer: Self-pay | Admitting: Cardiology

## 2023-01-18 VITALS — BP 108/60 | HR 86 | Wt 138.4 lb

## 2023-01-18 DIAGNOSIS — Z950 Presence of cardiac pacemaker: Secondary | ICD-10-CM | POA: Diagnosis not present

## 2023-01-18 DIAGNOSIS — I442 Atrioventricular block, complete: Secondary | ICD-10-CM | POA: Diagnosis not present

## 2023-01-18 DIAGNOSIS — I420 Dilated cardiomyopathy: Secondary | ICD-10-CM | POA: Diagnosis not present

## 2023-01-18 DIAGNOSIS — Z79899 Other long term (current) drug therapy: Secondary | ICD-10-CM | POA: Insufficient documentation

## 2023-01-18 DIAGNOSIS — Z7901 Long term (current) use of anticoagulants: Secondary | ICD-10-CM | POA: Insufficient documentation

## 2023-01-18 DIAGNOSIS — I5022 Chronic systolic (congestive) heart failure: Secondary | ICD-10-CM | POA: Diagnosis present

## 2023-01-18 DIAGNOSIS — Z8673 Personal history of transient ischemic attack (TIA), and cerebral infarction without residual deficits: Secondary | ICD-10-CM | POA: Insufficient documentation

## 2023-01-18 DIAGNOSIS — I428 Other cardiomyopathies: Secondary | ICD-10-CM | POA: Insufficient documentation

## 2023-01-18 DIAGNOSIS — D6851 Activated protein C resistance: Secondary | ICD-10-CM | POA: Insufficient documentation

## 2023-01-18 LAB — BASIC METABOLIC PANEL
Anion gap: 9 (ref 5–15)
BUN: 22 mg/dL — ABNORMAL HIGH (ref 6–20)
CO2: 26 mmol/L (ref 22–32)
Calcium: 9.1 mg/dL (ref 8.9–10.3)
Chloride: 102 mmol/L (ref 98–111)
Creatinine, Ser: 1.07 mg/dL (ref 0.61–1.24)
GFR, Estimated: >60 mL/min (ref >60)
Glucose, Bld: 103 mg/dL — ABNORMAL HIGH (ref 70–99)
Potassium: 4.5 mmol/L (ref 3.5–5.1)
Sodium: 137 mmol/L (ref 135–145)

## 2023-01-18 LAB — BRAIN NATRIURETIC PEPTIDE: B Natriuretic Peptide: 30.3 pg/mL (ref 0.0–100.0)

## 2023-01-18 MED ORDER — CARVEDILOL 6.25 MG PO TABS: 6.2500 mg | ORAL_TABLET | Freq: Two times a day (BID) | ORAL | 3 refills | Status: DC

## 2023-01-18 NOTE — Patient Instructions (Signed)
Medication Changes:  Increase Carvedilol to 6.25 mg Twice daily   Lab Work:  Labs done today, your results will be available in MyChart, we will contact you for abnormal readings.  Testing/Procedures:  Your physician has requested that you have an echocardiogram. Echocardiography is a painless test that uses sound waves to create images of your heart. It provides your doctor with information about the size and shape of your heart and how well your heart's chambers and valves are working. This procedure takes approximately one hour. There are no restrictions for this procedure. Please do NOT wear cologne, perfume, aftershave, or lotions (deodorant is allowed). Please arrive 15 minutes prior to your appointment time. IN 1 MONTH  Cardiac PET has been ordered, you will be called with appointment, please see and follow instructions below  Referrals:  none  Special Instructions // Education:  Do the following things EVERYDAY: Weigh yourself in the morning before breakfast. Write it down and keep it in a log. Take your medicines as prescribed Eat low salt foods--Limit salt (sodium) to 2000 mg per day.  Stay as active as you can everyday Limit all fluids for the day to less than 2 liters   Cardiac Sarcoidosis/Inflammation PET Scan Patient Instructions  Please report to Radiology at the [facility] Main Entrance 15 minutes early for your test.  [address] [place], [place] [address] BRING FOOD DIARY WITH YOU TO THIS APPOINTMENT For 24 hours before the test: Do not exercise! Do not eat after 5 pm the day before your test! To make sure that your scanning results are accurate, you MUST follow the sarcoid prep meal diet starting the day before your PET scan. This diet involves eating no carbohydrates 24 hours before the test.  You will keep a log of all that you eat the day before your test. If you have questions or do not understand this diet, please call [phone] for  more information. If you are unable to follow this diet, please discuss an alternative strategy with the coordinator.  If you are diabetic, continue your diabetes medications as usual on the day before until you begin to fast. NO DIABETES MEDICATIONS ONCE YOU BEGIN TO FAST. What foods can I eat the day before my test?  Drink only water or black coffee (WITHOUT sugar, artificial sweetener, cream, or milk). Eggs (prepared without milk or cheese)  Meat that is either broiled or pan fried in butter WITHOUT breading (chicken, [place], bacon, meat-only sausage, hamburger, steak, fish) Butter, salt & pepper What foods must I AVOID the day before my test?  Do not consume alcoholic beverages, sodas, fruit juice, coffee creamer, or sports drinks  Do not eat vegetables, beans, nuts, fruits, juices, bread, grains, rice, pasta, potatoes, or any baked goods Do not eat dairy products (milk, cheese, etc.)  Do not eat mayonnaise, ketchup, tartar sauce, mustard, or other condiments Do not add sugar, artificial sweeteners, or Splenda (sucralose) to foods or drinks  Do not eat breaded foods (like fried chicken)  Do not eat sweets, candy, gum, sweetened cough drops, lozenges, or sugar  Do not eat sweetened, grilled, or cured meats or meat with carbohydrate-containing additives (some sausages, ham, sweetened bacon) Suggested items for breakfast, lunch, or dinner:  Breakfast  3 to 5 fatty sausage links fried in butter. 3 to 5 bacon strips.  3 eggs pan fried in butter (no milk or cheese).  Lunch/Dinner  2 hamburger patties fried in butter. Chicken or fatty fish pan  fried in butter. No breading. 8 oz. fatty steak pan fried in butter.  Beverages  Drink only water or black coffee. DO NOT ADD SUGAR, ARTIFICIAL SWEETENER, CREAM, OR MILK   For more information and frequently asked questions, please visit our website : [email or web address]   Cardiac Sarcoidosis/Inflammation PET Scan  Food Diary Name:  _____________________________ Please fill in EXACTLY what you have eaten and when for 24 hours PRIOR to your test date.  Time Food/Drink Comments  Breakfast                Lunch                Dinner                Snacks                 DO NOT EXERCISE THE DAY BEFORE YOUR TEST DO NOT EAT AFTER 5 PM THE DAY BEFORE YOUR TEST.  ON THE DAY OF YOUR TEST, DO NOT EAT ANY FOOD AND ONLY DRINK CLEAR WATER! PLEASE BRING THIS FOOD DIARY WITH YOU TO YOUR APPOINTMENT   Follow-Up in:   Please follow up with our heart failure pharmacist in 4 weeks  Your physician recommends that you schedule a follow-up appointment in: 2 months   At the [facility], you and your health needs are our priority. We have a designated team specialized in the treatment of Heart Failure. This Care Team includes your primary Heart Failure Specialized Cardiologist (physician), Advanced Practice Providers (APPs- Physician Assistants and Nurse Practitioners), and Pharmacist who all work together to provide you with the care you need, when you need it.   You may see any of the following providers on your designated Care Team at your next follow up:  Dr. Arvilla Meres Dr. Marca Ancona Dr. Marcos Eke, NP Robbie Lis, [place] [facility] [place], [place] Brynda Peon, NP Karle Plumber, PharmD   Please be sure to bring in all your medications bottles to every appointment.   Need to Contact us:  If you have any questions or concerns before your next appointment please send [place] a message through [place] or call our office at [phone].    TO LEAVE A MESSAGE FOR THE NURSE SELECT OPTION 2, PLEASE LEAVE A MESSAGE INCLUDING: YOUR NAME DATE OF BIRTH CALL BACK NUMBER REASON FOR CALL**this is important as we prioritize the call backs  YOU WILL RECEIVE A CALL BACK THE SAME DAY AS LONG AS YOU CALL BEFORE 4:00 PM

## 2023-01-18 NOTE — Progress Notes (Signed)
PCP: Shon Hale, MD HF Cardiology: Dr. Shirlee Latch  57 y.o. 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  Patient developed high grade heart block in 2021 and had a Medtronic PPM with left bundle lead placed.  He is now 99% LB-paced.  Cause of heart block is uncertain.  No history of sarcoidosis or Lyme disease.  CT chest in 3/24 showed no pulmonary findings concerning for sarcoidosis.   Echo in 4/21 showed EF 55-60%.  Patient did well until 3/24 when he had an acute PCA CVA.  Echo in 3/24 showed EF 20-25%, normal RV, mild TR.  TEE showed EF 20-25%, no LV thrombus, and negative bubble study.  He was started on Eliquis due to concern for cardioembolism.   LHC/RHC in 5/24 showed no significant CAD, normal filling pressures, CI 3.32. Cardiac MRI in 5/24 showed mild LV dilation with EF 14%, septal-lateral dyssynchrony, normal RV, nonspecific inferior RV insertion site LGE, small area of mid-wall subepicardial LGE.   Despite left bundle lead, patient had very wide QRS.  He had CS lead placed in 6/24 with significant narrowing of QRS.   Patient returns for followup of CHF.  He has been more active recently.  Still fatigues easily, especially in the heat.  Has been down in [place] visiting grandchildren.  He walks 2 miles most days without dyspnea.  No chest pain.  No lightheadedness.  No orthopnea/PND.  No palpitations. Weight up 2 lbs.   ECG (personally reviewed): NSR, v-paced with QRS 128 msec (down from 180 msec)  Medtronic device interrogation: 99% BiV pacing, stable thoracic impedance  Labs (4/24): K 4.5, creatinine 1.0 Labs (6/24): K 5.1, creatinine 1.13  PMH: 1. Factor V Leiden heterozygote 2. Seizure disorder 3. High grade heart block: Medtronic PPM with left bundle lead in 2021.  - Despite left bundle lead, patient had very wide QRS.  He had CS lead placed in 6/24 with significant narrowing  of QRS.  4. Hypothyroidism 5. Hyperlipidemia 6. 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  - Negative bubble study.  7. Chronic systolic CHF: Echo in 4/21 with EF 55-60%.  Echo (3/24) with EF 20-25%, normal RV, mild TR. - TEE (3/24): EF 20-25%, no LV thrombus, negative bubble study.  - LHC/RHC (5/24): No significant CAD; mean RA 1, PA 22/4, mean PCWP 11, CI 3.32 - Cardiac MRI (5/24): Mild LV dilation with EF 14%, septal-lateral dyssynchrony, normal RV, nonspecific inferior RV insertion site LGE, small area of mid-wall subepicardial LGE.   FH: No cardiomyopathy or sarcoidosis.  Parkinsons in father and brother.   SH: Nonsmoker, rare ETOH, married, Quarry manager for [employer].   ROS: All systems reviewed and negative except as per HPI.   Current Outpatient Medications  Medication Sig Dispense Refill   apixaban (ELIQUIS) 5 MG TABS tablet Take 1 tablet (5 mg total) by mouth 2 (two) times daily. 60 tablet 3   Ascorbic Acid (VITAMIN C) 1000 MG tablet Take 1,000 mg by mouth 2 (two) times daily at 10 AM and 5 PM.     dapagliflozin propanediol (FARXIGA) 10 MG TABS tablet Take 1 tablet (10 mg total) by mouth daily before breakfast. 30 tablet 11   lacosamide (VIMPAT) 200 MG TABS tablet Take 1 tablet (200 mg total) by mouth 2 (two) times daily. 60 tablet 2   levETIRAcetam (KEPPRA) 750 MG tablet Take 1 tablet (750  mg total) by mouth 2 (two) times daily. 120 tablet 2   sacubitril-valsartan (ENTRESTO) 24-26 MG Take 1 tablet by mouth 2 (two) times daily. 60 tablet 11   spironolactone (ALDACTONE) 25 MG tablet Take 1 tablet (25 mg total) by mouth daily. 90 tablet 3   testosterone cypionate (DEPOTESTOSTERONE CYPIONATE) 200 MG/ML injection Inject 0.6 mLs into the muscle See admin instructions. Every 10 days     thyroid (ARMOUR) 60 MG tablet Take 60 mg by mouth daily before breakfast.     carvedilol (COREG) 6.25 MG tablet Take 1 tablet (6.25 mg total) by mouth 2 (two) times  daily with a meal. HOLD for SBP less than 90 60 tablet 3   No current facility-administered medications for this encounter.   BP 108/60   Pulse 86   Wt 62.8 kg (138 lb 6.4 oz)   SpO2 99%   BMI 19.58 kg/m  General: NAD Neck: No JVD, no thyromegaly or thyroid nodule.  Lungs: Clear to auscultation bilaterally with normal respiratory effort. CV: Nondisplaced PMI.  Heart regular S1/S2, no S3/S4, no murmur.  No peripheral edema.  No carotid bruit.  Normal pedal pulses.  Abdomen: Soft, nontender, no hepatosplenomegaly, no distention.  Skin: Intact without lesions or rashes.  Neurologic: Alert and oriented x 3.  Psych: Normal affect. Extremities: No clubbing or cyanosis.  HEENT: Normal.   Assessment/Plan: 1. High grade heart block: Patient has MDT CRT-P device with CS lead.  He had left bundle lead initially, but QRS was still very wide.  QRS much narrower with CS lead.  He is pacer dependent (>99% BiV paced).  Cause of heart block is uncertain.  Not known to have sarcoidosis or to have had Lyme disease.  CTA chest in 3/24 was not suggestive of pulmonary sarcoidosis.  2. CVA: Acute PCA CVA in 3/24 with some residual difficulty with memory and reading. 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  No atrial fibrillation detected and bubble study negative. Of note, he is a Factor V Leiden heterozygote.  - Continue Eliquis due to concern for cardioembolic CVA from occult LV thrombus.  3. Chronic systolic CHF: Nonischemic cardiomyopathy.  Echo in 3/24 with EF 20-25%, normal RV, mild TR.  LHC/RHC in 5/24 showed no significant coronary disease, normal filling pressures, preserved cardiac output.  Cardiomyopathy may have been triggered by long-term pacemaker-mediated dyssychrony (had LB lead initially but paced QRS was quite wide at 180 msec). CS lead placed in 6/24 with significant narrowing of QRS.  Cardiac MRI in 5/24 showed mild LV dilation with EF 14%, septal-lateral dyssynchrony, normal RV,  nonspecific inferior RV insertion site LGE, small area of mid-wall subepicardial LGE. The subepicardial LGE may be related to prior viral myocarditis though given complete heart block, would be concerned for cardiac sarcoidosis.  NYHA class II, not volume overloaded on exam.  - I will arrange for cardiac PET to assess for inflammation suggestive of active cardiac sarcoidosis.    - Increase Coreg to 6.25 mg bid.  - Continue dapagliflozin 10 mg daily - Continue spironolactone 25 mg daily.  BMET today.  - Continue Entresto 24/26 bid.  - With no LGE and hope for improvement with CRT, no defibrillator.  Could be added in future if EF remains persistently low.  - Repeat echo at followup in 2 months.   Followup with HF pharmacist in 3 wks for med titration.  See me in 2 months with echo.    Marca Ancona 01/18/2023

## 2023-01-25 ENCOUNTER — Encounter (HOSPITAL_COMMUNITY): Payer: Self-pay | Admitting: Cardiology

## 2023-01-26 NOTE — Telephone Encounter (Signed)
@  Jasmine-*Please see the attachment regarding insurance [employer].

## 2023-01-27 ENCOUNTER — Encounter: Payer: Self-pay | Admitting: Cardiology

## 2023-02-08 NOTE — Telephone Encounter (Signed)
@  Jasmine Please provide update

## 2023-02-09 ENCOUNTER — Ambulatory Visit: Payer: No Typology Code available for payment source

## 2023-02-09 DIAGNOSIS — I420 Dilated cardiomyopathy: Secondary | ICD-10-CM | POA: Diagnosis not present

## 2023-02-09 LAB — CUP PACEART REMOTE DEVICE CHECK
Battery Remaining Longevity: 92 mo
Battery Voltage: 3.1 V
Brady Statistic AP VP Percent: 0.54 %
Brady Statistic AP VS Percent: 0.01 %
Brady Statistic AS VP Percent: 99.43 %
Brady Statistic AS VS Percent: 0.03 %
Brady Statistic RA Percent Paced: 0.56 %
Brady Statistic RV Percent Paced: 99.96 %
Date Time Interrogation Session: 20240924215709
Implantable Lead Connection Status: 753985
Implantable Lead Connection Status: 753985
Implantable Lead Connection Status: 753985
Implantable Lead Implant Date: 20210805
Implantable Lead Implant Date: 20210805
Implantable Lead Implant Date: 20240624
Implantable Lead Location: 753858
Implantable Lead Location: 753859
Implantable Lead Location: 753860
Implantable Lead Model: 3830
Implantable Lead Model: 4598
Implantable Lead Model: 5076
Implantable Pulse Generator Implant Date: 20240624
Lead Channel Impedance Value: 380 Ohm
Lead Channel Impedance Value: 380 Ohm
Lead Channel Impedance Value: 399 Ohm
Lead Channel Impedance Value: 456 Ohm
Lead Channel Impedance Value: 456 Ohm
Lead Channel Impedance Value: 456 Ohm
Lead Channel Impedance Value: 475 Ohm
Lead Channel Impedance Value: 513 Ohm
Lead Channel Impedance Value: 532 Ohm
Lead Channel Impedance Value: 703 Ohm
Lead Channel Impedance Value: 703 Ohm
Lead Channel Impedance Value: 741 Ohm
Lead Channel Impedance Value: 798 Ohm
Lead Channel Impedance Value: 798 Ohm
Lead Channel Pacing Threshold Amplitude: 0.5 V
Lead Channel Pacing Threshold Amplitude: 1.25 V
Lead Channel Pacing Threshold Amplitude: 1.5 V
Lead Channel Pacing Threshold Pulse Width: 0.4 ms
Lead Channel Pacing Threshold Pulse Width: 0.4 ms
Lead Channel Pacing Threshold Pulse Width: 0.4 ms
Lead Channel Sensing Intrinsic Amplitude: 2.375 mV
Lead Channel Sensing Intrinsic Amplitude: 2.375 mV
Lead Channel Sensing Intrinsic Amplitude: 7.75 mV
Lead Channel Setting Pacing Amplitude: 1.5 V
Lead Channel Setting Pacing Amplitude: 2.25 V
Lead Channel Setting Pacing Amplitude: 3.5 V
Lead Channel Setting Pacing Pulse Width: 0.4 ms
Lead Channel Setting Pacing Pulse Width: 0.4 ms
Lead Channel Setting Sensing Sensitivity: 1.2 mV
Zone Setting Status: 755011
Zone Setting Status: 755011

## 2023-02-15 ENCOUNTER — Encounter: Payer: Self-pay | Admitting: Cardiovascular Disease

## 2023-02-15 ENCOUNTER — Telehealth (HOSPITAL_COMMUNITY): Payer: Self-pay | Admitting: Cardiology

## 2023-02-15 ENCOUNTER — Ambulatory Visit
Payer: No Typology Code available for payment source | Attending: Cardiovascular Disease | Admitting: Cardiovascular Disease

## 2023-02-15 ENCOUNTER — Encounter: Payer: No Typology Code available for payment source | Admitting: Cardiovascular Disease

## 2023-02-15 VITALS — BP 104/70 | HR 79 | Ht 70.5 in | Wt 137.0 lb

## 2023-02-15 DIAGNOSIS — I442 Atrioventricular block, complete: Secondary | ICD-10-CM | POA: Diagnosis not present

## 2023-02-15 DIAGNOSIS — I5022 Chronic systolic (congestive) heart failure: Secondary | ICD-10-CM | POA: Diagnosis not present

## 2023-02-15 DIAGNOSIS — I429 Cardiomyopathy, unspecified: Secondary | ICD-10-CM

## 2023-02-15 LAB — CUP PACEART INCLINIC DEVICE CHECK
Date Time Interrogation Session: 20241001102143
Implantable Lead Connection Status: 753985
Implantable Lead Connection Status: 753985
Implantable Lead Connection Status: 753985
Implantable Lead Implant Date: 20210805
Implantable Lead Implant Date: 20210805
Implantable Lead Implant Date: 20240624
Implantable Lead Location: 753858
Implantable Lead Location: 753859
Implantable Lead Location: 753860
Implantable Lead Model: 3830
Implantable Lead Model: 4598
Implantable Lead Model: 5076
Implantable Pulse Generator Implant Date: 20240624

## 2023-02-15 NOTE — Telephone Encounter (Signed)
Patients wife called to report appt information has not been given for PET scan, has been waiting x  weeks   Per wife insurance  states no pre cert is required  Message to [place] to verify pre cert Message to provider team to review order

## 2023-02-15 NOTE — Progress Notes (Signed)
Electrophysiology Office Note:    Date:  02/15/2023   ID:  Luke Jimenez, DOB November 01, 1965, MRN [id number]  PCP:  Shon Hale, MD   [facility] Providers Cardiologist:  Donato Schultz, MD Electrophysiologist:  Hillis Range, MD (Inactive)     Referring MD: Shon Hale, *   History of Present Illness:    Luke Jimenez is a 57 y.o. male with a hx listed below, significant for bradycardia with pacemaker in place, referred for arrhythmia management.  He was found to have 2-1 AV block and paroxysmal pauses of greater than 4 seconds prior to a pacemaker placement in August 2021 by Dr. Johney Frame.  A left bundle position lead was placed at that time.  He was admitted in March 2024 with vague symptoms concerning for stroke.  During the admission he was found to have an EF of 20 to 25%.  Coreg 3.125 mg twice daily was started prior to discharge.  He was started on dapagliflozin 10, spironolactone 25 yesterday.  Since his last visit, he had a CMR that showed EF < 15% with minimal LGE. This indicated a decline in EF despite having been on dapaglifozin, spironolactone, carvedilol, and entresto.   He underwent upgrade to a BiV pacemaker on November 08, 2022.  He reports that he is feeling much better. The erectile dysfunction that he was experiencing prior to the upgrade has essentially resolved.   EKGs/Labs/Other Studies Reviewed Today:    Echocardiogram:  08/12/22 EF 20-25% with global hypokinesis   Monitors:   Stress testing:   Advanced imaging:  CMR 10/07/2022     EF 14% with prominent septal-lateral dyssynchrony  EKG:  Last EKG results: yesterday - reviewed by me -- A-sensed, V-paced   Recent Labs: 08/09/2022: TSH 0.063 08/10/2022: ALT 11 08/13/2022: Magnesium 2.0 10/20/2022: Hemoglobin 16.9; Platelets 259 01/18/2023: B Natriuretic Peptide 30.3; BUN 22; Creatinine, Ser 1.07; Potassium 4.5; Sodium 137     Physical Exam:    VS:  BP 104/70 (BP Location: Left Arm,  Patient Position: Sitting, Cuff Size: Normal)   Pulse 79   Ht 5' 10.5" (1.791 m)   Wt 137 lb (62.1 kg)   SpO2 96%   BMI 19.38 kg/m     Wt Readings from Last 3 Encounters:  02/15/23 137 lb (62.1 kg)  01/18/23 138 lb 6.4 oz (62.8 kg)  11/08/22 137 lb (62.1 kg)     GEN: Well nourished, well developed in no acute distress CARDIAC: RRR, no murmurs, rubs, gallops The device site is normal -- no tenderness, edema, drainage, redness, threatened erosion. RESPIRATORY:  Normal work of breathing MUSCULOSKELETAL: no edema    ASSESSMENT & PLAN:    Symptomatic second degree AV block Normal device function LBB lead - LV activation is not optimal; QRS is fairly wide but LVAT appears ok. Risk of pacing-induced cardiomyopathy is not as clear as with RV apical pacing. He is device dependent He underwent upgrade to CRT device with placement of a coronary sinus and left ventricular lead on November 08, 2022  Medtronic BiV pacemaker RV lead is in the LBB position LV lead upgrade 10/2022 Device interrogated today -- normal function  CHFrEF Diagnosis at the time of an admission for stroke We discussed options. I have hope that, because there is low LGE on CMR, he may have recovery of EF with re-synchronization. Will plan to upgrade his existing device with additional of an LV lead. Repeat echocardiogram scheduled for October 7  Medication Adjustments/Labs and Tests Ordered: Current medicines are reviewed at length with the patient today.  Concerns regarding medicines are outlined above.  Orders Placed This Encounter  Procedures   EKG 12-Lead   No orders of the defined types were placed in this encounter.    Signed, Maurice Small, MD  02/15/2023 9:41 AM    [facility]

## 2023-02-15 NOTE — Patient Instructions (Signed)
Medication Instructions:  Your physician recommends that you continue on your current medications as directed. Please refer to the Current Medication list given to you today. *If you need a refill on your cardiac medications before your next appointment, please call your pharmacy*   Follow-Up: At [facility], you and your health needs are our priority.  As part of our continuing mission to provide you with exceptional heart care, we have created designated Provider Care Teams.  These Care Teams include your primary Cardiologist (physician) and Advanced Practice Providers (APPs -  Physician Assistants and Nurse Practitioners) who all work together to provide you with the care you need, when you need it.  We recommend signing up for the patient portal called "MyChart".  Sign up information is provided on this After Visit Summary.  MyChart is used to connect with patients for Virtual Visits (Telemedicine).  Patients are able to view lab/test results, encounter notes, upcoming appointments, etc.  Non-urgent messages can be sent to your provider as well.   To learn more about what you can do with MyChart, go to [email or web address].    Your next appointment:   6 month(s)  Provider:   You will see one of the following Advanced Practice Providers on your designated Care Team:   Francis Dowse, Charlott Holler [address] [place], [place] Sherie Don, NP Canary Brim, NP  1 year follow up with Dr Nelly Laurence

## 2023-02-18 NOTE — Progress Notes (Incomplete)
***In Progress***    Advanced Heart Failure Clinic Note  PCP: Shon Hale, MD HF Cardiology: Dr. Shirlee Latch  HPI:  57 y.o. 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 Patient developed high grade heart block in 2021 and had a Medtronic PPM with left bundle lead placed. He is now 99% LB-paced.  Cause of heart block is uncertain. No history of sarcoidosis or Lyme disease.  CT chest in 07/2022 showed no pulmonary findings concerning for sarcoidosis. Echo in 08/2019 showed EF 55-60%. Patient did well until 07/2022 when he had an acute PCA CVA. Echo in 07/2022 showed EF 20-25%, normal RV, mild TR. TEE showed EF 20-25%, no LV thrombus, and negative bubble study. He was started on Eliquis due to concern for cardioembolism.    LHC/RHC in 09/2022 showed no significant CAD, normal filling pressures, CI 3.32. Cardiac MRI in 09/2022 showed mild LV dilation with EF 14%, septal-lateral dyssynchrony, normal RV, nonspecific inferior RV insertion site LGE, small area of mid-wall subepicardial LGE.    Despite left bundle lead, patient had very wide QRS. He had CS lead placed in 10/2022 with significant narrowing of QRS.    Patient returns for followup of CHF. He has been more active recently. Still fatigues easily, especially in the heat. Has been down in [place] visiting grandchildren. He walks 2 miles most days without dyspnea. No chest pain. No lightheadedness. No orthopnea/PND. No palpitations. Weight up 2 lbs.    Today he returns to HF clinic for pharmacist medication titration. At last visit with MD ***.   Shortness of breath/dyspnea on exertion? {YES NO:22349}  Orthopnea/PND? {YES NO:22349} Edema? {YES NO:22349} Lightheadedness/dizziness? {YES NO:22349} Daily weights at home? {YES NO:22349} Blood pressure/heart rate monitoring at home? {YES [id number] Following low-sodium/fluid-restricted diet? {YES  NO:22349}  HF Medications:   Has the patient been experiencing any side effects to the medications prescribed?  {YES NO:22349}  Does the patient have any problems obtaining medications due to transportation or finances?   {YES NO:22349}  Understanding of regimen: {excellent/good/fair/poor:19665} Understanding of indications: {excellent/good/fair/poor:19665} Potential of compliance: {excellent/good/fair/poor:19665} Patient understands to avoid NSAIDs. Patient understands to avoid decongestants.    Pertinent Lab Values: 01/18/23 Serum creatinine 1.07, BUN 22, Potassium 4.5, Sodium 137, BNP 30.3  Vital Signs: Weight: *** (last clinic weight: 137 lbs) Blood pressure: ***  Heart rate: ***   Assessment/Plan: 1. High grade heart block: Patient has MDT CRT-P device with CS lead.  He had left bundle lead initially, but QRS was still very wide.  QRS much narrower with CS lead.  He is pacer dependent (>99% BiV paced).  Cause of heart block is uncertain.  Not known to have sarcoidosis or to have had Lyme disease.  CTA chest in 3/24 was not suggestive of pulmonary sarcoidosis.  2. CVA: Acute PCA CVA in 3/24 with some residual difficulty with memory and reading. 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  No atrial fibrillation detected and bubble study negative. Of note, he is a Factor V Leiden heterozygote.  - Continue Eliquis due to concern for cardioembolic CVA from occult LV thrombus.  3. Chronic systolic CHF: Nonischemic cardiomyopathy.  Echo in 3/24 with EF 20-25%, normal RV, mild TR.  LHC/RHC in 5/24 showed no significant coronary disease, normal filling pressures, preserved cardiac output.  Cardiomyopathy may have been triggered by long-term pacemaker-mediated dyssychrony (had LB lead initially but paced QRS was quite wide at 180 msec).  CS lead placed in 6/24 with significant narrowing of QRS.  Cardiac MRI in 5/24 showed mild LV dilation with EF 14%, septal-lateral dyssynchrony, normal RV,  nonspecific inferior RV insertion site LGE, small area of mid-wall subepicardial LGE. The subepicardial LGE may be related to prior viral myocarditis though given complete heart block, would be concerned for cardiac sarcoidosis.  NYHA class II, not volume overloaded on exam.  - I will arrange for cardiac PET to assess for inflammation suggestive of active cardiac sarcoidosis.    - Increase Coreg to 6.25 mg bid.  - Continue dapagliflozin 10 mg daily - Continue spironolactone 25 mg daily.  BMET today.  - Continue Entresto 24/26 bid.  - With no LGE and hope for improvement with CRT, no defibrillator.  Could be added in future if EF remains persistently low.  - Repeat echo at followup in 2 months.    Followup with HF pharmacist in 3 wks for med titration.  See me in 2 months with echo.        Follow up ***   Karle Plumber, PharmD, BCPS, BCCP, CPP Heart Failure Clinic Pharmacist [phone]

## 2023-02-21 ENCOUNTER — Ambulatory Visit (HOSPITAL_BASED_OUTPATIENT_CLINIC_OR_DEPARTMENT_OTHER)
Admission: RE | Admit: 2023-02-21 | Discharge: 2023-02-21 | Disposition: A | Discharge status: Home / Self Care | Payer: No Typology Code available for payment source | Source: Ambulatory Visit | Attending: *Deleted | Admitting: *Deleted

## 2023-02-21 ENCOUNTER — Ambulatory Visit (HOSPITAL_COMMUNITY)
Admission: RE | Admit: 2023-02-21 | Discharge: 2023-02-21 | Disposition: A | Discharge status: Home / Self Care | Payer: No Typology Code available for payment source | Source: Ambulatory Visit | Attending: Cardiology | Admitting: Cardiology

## 2023-02-21 VITALS — BP 118/66 | HR 87 | Wt 137.6 lb

## 2023-02-21 DIAGNOSIS — I420 Dilated cardiomyopathy: Secondary | ICD-10-CM

## 2023-02-21 DIAGNOSIS — I5022 Chronic systolic (congestive) heart failure: Secondary | ICD-10-CM | POA: Diagnosis present

## 2023-02-21 DIAGNOSIS — I083 Combined rheumatic disorders of mitral, aortic and tricuspid valves: Secondary | ICD-10-CM | POA: Insufficient documentation

## 2023-02-21 LAB — ECHOCARDIOGRAM COMPLETE
Calc EF: 32.5 %
S' Lateral: 4.6 cm
Single Plane A2C EF: 33.3 %
Single Plane A4C EF: 29.2 %

## 2023-02-21 MED ORDER — ENTRESTO 24-26 MG PO TABS: 1.0000 | ORAL_TABLET | Freq: Two times a day (BID) | ORAL | 3 refills | Status: DC

## 2023-02-21 MED ORDER — CARVEDILOL 12.5 MG PO TABS: 12.5000 mg | ORAL_TABLET | Freq: Two times a day (BID) | ORAL | 3 refills | Status: DC

## 2023-02-21 MED ORDER — DAPAGLIFLOZIN PROPANEDIOL 10 MG PO TABS: 10.0000 mg | ORAL_TABLET | Freq: Every day | ORAL | 3 refills | Status: DC

## 2023-02-21 MED ORDER — APIXABAN 5 MG PO TABS: 5.0000 mg | ORAL_TABLET | Freq: Two times a day (BID) | ORAL | 3 refills | Status: DC

## 2023-02-21 NOTE — Progress Notes (Signed)
Advanced Heart Failure Clinic Note   PCP: Shon Hale, MD HF Cardiology: Dr. Shirlee Latch  HPI:  Luke Jimenez is a 57 y.o. 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 Patient developed high grade heart block in 2021 and had a Medtronic PPM with left bundle lead placed. He is now 99% LB-paced.  Cause of heart block is uncertain. No history of sarcoidosis or Lyme disease.  CT chest in 07/2022 showed no pulmonary findings concerning for sarcoidosis. Echo in 08/2019 showed EF 55-60%. Patient did well until 07/2022 when he had an acute PCA CVA. Echo in 07/2022 showed EF 20-25%, normal RV, mild TR. TEE showed EF 20-25%, no LV thrombus, and negative bubble study. He was started on Eliquis due to concern for cardioembolism.    LHC/RHC in 09/2022 showed no significant CAD, normal filling pressures, CI 3.32. Cardiac MRI in 09/2022 showed mild LV dilation with EF 14%, septal-lateral dyssynchrony, normal RV, nonspecific inferior RV insertion site LGE, small area of mid-wall subepicardial LGE.    Despite left bundle lead, patient had very wide QRS. He had CS lead placed in 10/2022 with significant narrowing of QRS.    Patient recently returned to AHF clinic on 01/18/23 for followup of CHF. At that visit, he stated that he had been more active but still fatigued easily, especially in the heat. He was down in [place] visiting grandchildren. He walked 2 miles most days without dyspnea. No chest pain. No lightheadedness. No orthopnea/PND. No palpitations. Weight was up 2 lbs.   Today he returns to HF clinic for pharmacist medication titration. At last visit with MD, carvedilol was increased to 6.25 mg BID. Overall feeling fine. Main complaint is constant fatigue. However, that has improved since last visit after increasing carvedilol. He continues to walk 2 miles most mornings without dyspnea.  No CP/palpitations. No dizziness or lightheadedness. No LEE, PND or  orthopnea. Appetite has been poor but stable since last visit. He weighs himself daily at home. Weight fluctuates between low 130s lbs in the morning and 135-137 lbs in the evening. Weight has been stable in-clinic at 137 lbs. He does take BP at home with SBP between 100-108 mmHg in the morning, will increase in the afternoons. In clinic, BP 118/66 mmHg. He follows a low-salt diet and does not eat processed foods.   HF Medications: Carvedilol 6.25 mg BID Entresto 24/26 mg BID Spironolactone 25 mg daily Farxiga 10 mg daily  Has the patient been experiencing any side effects to the medications prescribed? No  Does the patient have any problems obtaining medications due to transportation or finances?  No - [employer] of regimen: good Understanding of indications: good Potential of compliance: good Patient understands to avoid NSAIDs. Patient understands to avoid decongestants.    Pertinent Lab Values: 01/18/23 Serum creatinine 1.07, BUN 22, Potassium 4.5, Sodium 137, BNP 30.3  Vital Signs: Weight: 137.6 lbs (last clinic weight: 137 lbs) Blood pressure: 118/66 mmHg Heart rate: 87   Assessment/Plan: 1. High grade heart block: Patient has MDT CRT-P device with CS lead. He had left bundle lead initially, but QRS was still very wide. QRS much narrower with CS lead. He is pacer dependent (>99% BiV paced). Cause of heart block is uncertain. Not known to have sarcoidosis or to have had Lyme disease. CTA chest in 07/2022 was not suggestive of pulmonary sarcoidosis.  2. CVA: Acute PCA CVA in 07/2022 with some  residual difficulty with memory and reading. 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 No atrial fibrillation detected and bubble study negative. Of note, he is a Factor V Leiden heterozygote.  - Continue Eliquis due to concern for cardioembolic CVA from occult LV thrombus.  3. Chronic systolic CHF: Nonischemic cardiomyopathy. Echo in 07/2022 with EF 20-25%,  normal RV, mild TR.  LHC/RHC in 09/2022 showed no significant coronary disease, normal filling pressures, preserved cardiac output. Cardiomyopathy may have been triggered by long-term pacemaker-mediated dyssychrony (had LB lead initially but paced QRS was quite wide at 180 msec). CS lead placed in 10/2022 with significant narrowing of QRS. Cardiac MRI in 09/2022 showed mild LV dilation with EF 14%, septal-lateral dyssynchrony, normal RV, nonspecific inferior RV insertion site LGE, small area of mid-wall subepicardial LGE. The subepicardial LGE may be related to prior viral myocarditis though given complete heart block, would be concerned for cardiac sarcoidosis.  - NYHA class II, not volume overloaded on exam.  - Increase carvedilol to 12.5 mg BID.  - Continue Entresto 24/26 mg BID.  - Continue spironolactone 25 mg daily.   - Continue Farxiga 10 mg daily - With no LGE and hope for improvement with CRT, no defibrillator.  Could be added in future if EF remains persistently low.  -cardiac PET to assess for inflammation suggestive of active cardiac sarcoidosis, scheduled for 03/09/23.  -Echo today, results pending    Follow up in 3 weeks at APP clinic.   Karle Plumber, PharmD, BCPS, BCCP, CPP Heart Failure Clinic Pharmacist [phone]

## 2023-02-21 NOTE — Patient Instructions (Signed)
It was a pleasure seeing you today!  MEDICATIONS: -We are changing your medications today -Increase carvedilol to 12.5 mg (1 tablet) twice daily. You may take 2 tablets of the 6.25 mg strength twice daily until you pick up the new strength.  -Call if you have questions about your medications.   NEXT APPOINTMENT: Return to clinic in 3 weeks with APP CLinic.  In general, to take care of your heart failure: -Limit your fluid intake to 2 Liters (half-gallon) per day.   -Limit your salt intake to ideally 2-3 grams (2000-3000 mg) per day. -Weigh yourself daily and record, and bring that "weight diary" to your next appointment.  (Weight gain of 2-3 pounds in 1 day typically means fluid weight.) -The medications for your heart are to help your heart and help you live longer.   -Please contact us before stopping any of your heart medications.  Call the clinic at [phone] with questions or to reschedule future appointments.

## 2023-02-22 NOTE — Telephone Encounter (Signed)
PET SCAN scheduled

## 2023-02-24 ENCOUNTER — Telehealth (HOSPITAL_COMMUNITY): Payer: Self-pay | Admitting: *Deleted

## 2023-02-24 NOTE — Telephone Encounter (Signed)
Called patient and his wife with following echo results from Dr. Shirlee Latch:  "EF still low at 25-30%.  Will need to get results from cardiac PET to look for cardiac sarcoidosis.  If it looks like his EF will stay low, will need to consider upgrade of CRT-P device to CRT-D."  Pt has PET scan scheduled 10/23 and f/u in HF APP Clinic 10/31 and can discuss recommendations at that time. Pt and wife verbalized understanding of same.

## 2023-02-25 NOTE — Progress Notes (Signed)
Remote pacemaker transmission.   

## 2023-03-02 ENCOUNTER — Encounter (HOSPITAL_COMMUNITY): Payer: Self-pay

## 2023-03-07 ENCOUNTER — Telehealth (HOSPITAL_COMMUNITY): Payer: Self-pay | Admitting: *Deleted

## 2023-03-07 ENCOUNTER — Encounter (HOSPITAL_COMMUNITY): Payer: Self-pay

## 2023-03-07 NOTE — Telephone Encounter (Signed)
Reaching out to patient to offer assistance regarding upcoming cardiac imaging study; pt's wife answered phone and verbalizes understanding of appt date/time, parking situation and where to check in, pre-test NPO status; name and call back number provided for further questions should they arise  Larey Brick RN Navigator Cardiac Imaging [facility] [phone] office [phone] cell  Patient's wife understanding diet prep.

## 2023-03-09 ENCOUNTER — Encounter (HOSPITAL_COMMUNITY)
Admission: RE | Admit: 2023-03-09 | Discharge: 2023-03-09 | Disposition: A | Discharge status: Home / Self Care | Payer: No Typology Code available for payment source | Source: Ambulatory Visit | Attending: Cardiology | Admitting: Cardiology

## 2023-03-09 DIAGNOSIS — I420 Dilated cardiomyopathy: Secondary | ICD-10-CM | POA: Insufficient documentation

## 2023-03-09 DIAGNOSIS — I5022 Chronic systolic (congestive) heart failure: Secondary | ICD-10-CM | POA: Insufficient documentation

## 2023-03-09 LAB — NM PET CT MYOCARDIAL SARCOIDOSIS
LV dias vol: 132 mL (ref 62–150)
LV sys vol: 89 mL
Nuc Stress EF: 33 %
Rest Nuclear Isotope Dose: 16.2 mCi

## 2023-03-09 MED ORDER — FLUDEOXYGLUCOSE F - 18 (FDG) INJECTION
9.0200 | Freq: Once | INTRAVENOUS | Status: AC
  Administered 2023-03-09: 9.02 via INTRAVENOUS

## 2023-03-09 MED ORDER — RUBIDIUM RB82 GENERATOR (RUBYFILL)
16.1900 | PACK | Freq: Once | INTRAVENOUS | Status: AC
  Administered 2023-03-09: 16.19 via INTRAVENOUS

## 2023-03-10 ENCOUNTER — Telehealth (HOSPITAL_COMMUNITY): Payer: Self-pay

## 2023-03-10 NOTE — Telephone Encounter (Addendum)
Pt aware, agreeable, and verbalized understanding   ----- Message from Marca Ancona sent at 03/09/2023  5:21 PM EDT ----- LV EF 33%, no evidence for active sarcoidosis.

## 2023-03-17 ENCOUNTER — Ambulatory Visit (HOSPITAL_COMMUNITY)
Admission: RE | Admit: 2023-03-17 | Discharge: 2023-03-17 | Disposition: A | Discharge status: Home / Self Care | Payer: No Typology Code available for payment source | Source: Ambulatory Visit | Attending: Cardiology | Admitting: Cardiology

## 2023-03-17 ENCOUNTER — Encounter (HOSPITAL_COMMUNITY): Payer: Self-pay

## 2023-03-17 VITALS — BP 112/82 | HR 85 | Wt 138.0 lb

## 2023-03-17 DIAGNOSIS — Z8673 Personal history of transient ischemic attack (TIA), and cerebral infarction without residual deficits: Secondary | ICD-10-CM | POA: Diagnosis not present

## 2023-03-17 DIAGNOSIS — Z79899 Other long term (current) drug therapy: Secondary | ICD-10-CM | POA: Insufficient documentation

## 2023-03-17 DIAGNOSIS — D6851 Activated protein C resistance: Secondary | ICD-10-CM | POA: Insufficient documentation

## 2023-03-17 DIAGNOSIS — Z4502 Encounter for adjustment and management of automatic implantable cardiac defibrillator: Secondary | ICD-10-CM | POA: Insufficient documentation

## 2023-03-17 DIAGNOSIS — I5022 Chronic systolic (congestive) heart failure: Secondary | ICD-10-CM | POA: Diagnosis not present

## 2023-03-17 DIAGNOSIS — Z7901 Long term (current) use of anticoagulants: Secondary | ICD-10-CM | POA: Insufficient documentation

## 2023-03-17 DIAGNOSIS — I428 Other cardiomyopathies: Secondary | ICD-10-CM | POA: Diagnosis not present

## 2023-03-17 DIAGNOSIS — I459 Conduction disorder, unspecified: Secondary | ICD-10-CM | POA: Insufficient documentation

## 2023-03-17 LAB — BASIC METABOLIC PANEL
Anion gap: 8 (ref 5–15)
BUN: 22 mg/dL — ABNORMAL HIGH (ref 6–20)
CO2: 26 mmol/L (ref 22–32)
Calcium: 9.3 mg/dL (ref 8.9–10.3)
Chloride: 103 mmol/L (ref 98–111)
Creatinine, Ser: 0.99 mg/dL (ref 0.61–1.24)
GFR, Estimated: >60 mL/min (ref >60)
Glucose, Bld: 123 mg/dL — ABNORMAL HIGH (ref 70–99)
Potassium: 4.6 mmol/L (ref 3.5–5.1)
Sodium: 137 mmol/L (ref 135–145)

## 2023-03-17 LAB — BRAIN NATRIURETIC PEPTIDE: B Natriuretic Peptide: 36.2 pg/mL (ref 0.0–100.0)

## 2023-03-17 MED ORDER — CARVEDILOL 6.25 MG PO TABS: 6.2500 mg | ORAL_TABLET | Freq: Two times a day (BID) | ORAL | 3 refills | Status: DC

## 2023-03-17 NOTE — Patient Instructions (Signed)
Medication Changes:  No Changes In Medications at this time.   Lab Work:  Labs done today, your results will be available in MyChart, we will contact you for abnormal readings.  Follow-Up in: 2-3 MONTHS WITH DR. Shirlee Latch PLEASE CALL OUR OFFICE AROUND DECEMBER TO GET SCHEDULED FOR YOUR APPOINTMENT. PHONE NUMBER IS [phone] OPTION 2   At the [facility], you and your health needs are our priority. We have a designated team specialized in the treatment of Heart Failure. This Care Team includes your primary Heart Failure Specialized Cardiologist (physician), Advanced Practice Providers (APPs- Physician Assistants and Nurse Practitioners), and Pharmacist who all work together to provide you with the care you need, when you need it.   You may see any of the following providers on your designated Care Team at your next follow up:  Dr. Arvilla Meres Dr. Marca Ancona Dr. Dorthula Nettles Dr. Theresia Bough Tonye Becket, NP Robbie Lis, [place] [facility] [place], [place] Brynda Peon, NP [place] Lee, NP Karle Plumber, PharmD   Please be sure to bring in all your medications bottles to every appointment.   Need to Contact us:  If you have any questions or concerns before your next appointment please send [place] a message through [place] or call our office at [phone].    TO LEAVE A MESSAGE FOR THE NURSE SELECT OPTION 2, PLEASE LEAVE A MESSAGE INCLUDING: YOUR NAME DATE OF BIRTH CALL BACK NUMBER REASON FOR CALL**this is important as we prioritize the call backs  YOU WILL RECEIVE A CALL BACK THE SAME DAY AS LONG AS YOU CALL BEFORE 4:00 PM

## 2023-03-17 NOTE — Addendum Note (Signed)
Encounter addended by: Faythe Casa, CMA on: 03/17/2023 3:49 PM  Actions taken: Flowsheet accepted, Clinical Note Signed

## 2023-03-17 NOTE — Progress Notes (Signed)
PCP: Shon Hale, MD HF Cardiology: Dr. Shirlee Latch  57 y.o. 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  Patient developed high grade heart block in 2021 and had a Medtronic PPM with left bundle lead placed.  He is now 99% LB-paced.  Cause of heart block is uncertain.  No history of sarcoidosis or Lyme disease.  CT chest in 3/24 showed no pulmonary findings concerning for sarcoidosis.   Echo in 4/21 showed EF 55-60%.  Patient did well until 3/24 when he had an acute PCA CVA.  Echo in 3/24 showed EF 20-25%, normal RV, mild TR.  TEE showed EF 20-25%, no LV thrombus, and negative bubble study.  He was started on Eliquis due to concern for cardioembolism.   LHC/RHC in 5/24 showed no significant CAD, normal filling pressures, CI 3.32. Cardiac MRI in 5/24 showed mild LV dilation with EF 14%, septal-lateral dyssynchrony, normal RV, nonspecific inferior RV insertion site LGE, small area of mid-wall subepicardial LGE.   Despite left bundle lead, patient had very wide QRS.  He had CS lead placed in 6/24 with significant narrowing of QRS and improvement in symptoms/ exercise tolerance.   2D Echo 10/24 showed EF 25-30%.   Had cardiac PET 10/24 that showed EF 33% and no evidence of active inflammation/sarcoidosis.   Patient returns for followup of CHF.  Here w/ his wife. Feels better. Exercise tolerance improved. Denies resting dyspnea. No orthopnea/PND. No LEE. Wt was trending down but has since stabilized. BP 112/82. Compliant w/ meds. Device interrogation shows 100% BiV pacing. 1 brief recent episode of AT/AF ~10 min. Reports full compliance w/ Eliquis. Device interrogation also shows elevated fluid index but findings not c/w exam. ReDs also low at 19%.   Medtronic device interrogation: 100% BiV pacing, thoracic impedance down/fluid index up but not correlating w/ exam and ReDS (19%)  Labs (4/24): K 4.5,  creatinine 1.0 Labs (6/24): K 5.1, creatinine 1.13 Labs (9/23): K 4.5, creatinine 1.07   PMH: 1. Factor V Leiden heterozygote 2. Seizure disorder 3. High grade heart block: Medtronic PPM with left bundle lead in 2021.  - Despite left bundle lead, patient had very wide QRS.  He had CS lead placed in 6/24 with significant narrowing of QRS.  4. Hypothyroidism 5. Hyperlipidemia 6. 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  - Negative bubble study.  7. Chronic systolic CHF: Echo in 4/21 with EF 55-60%.  Echo (3/24) with EF 20-25%, normal RV, mild TR. - TEE (3/24): EF 20-25%, no LV thrombus, negative bubble study.  - LHC/RHC (5/24): No significant CAD; mean RA 1, PA 22/4, mean PCWP 11, CI 3.32 - Cardiac MRI (5/24): Mild LV dilation with EF 14%, septal-lateral dyssynchrony, normal RV, nonspecific inferior RV insertion site LGE, small area of mid-wall subepicardial LGE.   FH: No cardiomyopathy or sarcoidosis.  Parkinsons in father and brother.   SH: Nonsmoker, rare ETOH, married, Quarry manager for [employer].   ROS: All systems reviewed and negative except as per HPI.   Current Outpatient Medications  Medication Sig Dispense Refill   apixaban (ELIQUIS) 5 MG TABS tablet Take 1 tablet (5 mg total) by mouth 2 (two) times daily. 180 tablet 3   Ascorbic Acid (VITAMIN C) 1000 MG tablet Take 1,000 mg by mouth 2 (two) times daily at 10 AM and 5 PM.     carvedilol (COREG) 12.5 MG tablet  Take 1 tablet (12.5 mg total) by mouth 2 (two) times daily with a meal. HOLD for SBP less than 90 (Patient taking differently: Take 6.25 mg by mouth 2 (two) times daily with a meal. HOLD for SBP less than 90) 180 tablet 3   dapagliflozin propanediol (FARXIGA) 10 MG TABS tablet Take 1 tablet (10 mg total) by mouth daily before breakfast. 90 tablet 3   lacosamide (VIMPAT) 200 MG TABS tablet Take 1 tablet (200 mg total) by mouth 2 (two) times daily. 60 tablet 2   sacubitril-valsartan  (ENTRESTO) 24-26 MG Take 1 tablet by mouth 2 (two) times daily. 180 tablet 3   spironolactone (ALDACTONE) 25 MG tablet Take 1 tablet (25 mg total) by mouth daily. 90 tablet 3   testosterone cypionate (DEPOTESTOSTERONE CYPIONATE) 200 MG/ML injection Inject 0.6 mLs into the muscle See admin instructions. Every 10 days     thyroid (ARMOUR) 60 MG tablet Take 60 mg by mouth daily before breakfast.     No current facility-administered medications for this encounter.   BP 112/82   Pulse 85   Wt 62.6 kg (138 lb)   SpO2 98%   BMI 19.52 kg/m  PHYSICAL EXAM: ReDs 19%  General:  Well appearing. No respiratory difficulty HEENT: normal Neck: supple. no JVD. Carotids 2+ bilat; no bruits. No lymphadenopathy or thyromegaly appreciated. Cor: PMI nondisplaced. Regular rate & rhythm. No rubs, gallops or murmurs. Lungs: clear Abdomen: soft, nontender, nondistended. No hepatosplenomegaly. No bruits or masses. Good bowel sounds. Extremities: no cyanosis, clubbing, rash, edema Neuro: alert & oriented x 3, cranial nerves grossly intact. moves all 4 extremities w/o difficulty. Affect pleasant.   Assessment/Plan: 1. High grade heart block: Patient has MDT CRT-P device with CS lead.  He had left bundle lead initially, but QRS was still very wide.  QRS much narrower with CS lead.  He is pacer dependent (>99% BiV paced).  Cause of heart block is uncertain.  Not known to have sarcoidosis or to have had Lyme disease.  CTA chest in 3/24 was not suggestive of pulmonary sarcoidosis. Cardiac PET 10/24 showed no signs of active inflammation/sarcoidosis   2. CVA: Acute PCA CVA in 3/24 with some residual difficulty with memory and reading. 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  No atrial fibrillation detected and bubble study negative. Of note, he is a Factor V Leiden heterozygote.  - Continue Eliquis due to concern for cardioembolic CVA from occult LV thrombus.  3. Chronic systolic CHF: Nonischemic cardiomyopathy.   Echo in 3/24 with EF 20-25%, normal RV, mild TR.  LHC/RHC in 5/24 showed no significant coronary disease, normal filling pressures, preserved cardiac output.  Cardiomyopathy may have been triggered by long-term pacemaker-mediated dyssychrony (had LB lead initially but paced QRS was quite wide at 180 msec). CS lead placed in 6/24 with significant narrowing of QRS.  Cardiac MRI in 5/24 showed mild LV dilation with EF 14%, septal-lateral dyssynchrony, normal RV, nonspecific inferior RV insertion site LGE, small area of mid-wall subepicardial LGE. The subepicardial LGE may be related to prior viral myocarditis though given complete heart block, there was concern for cardiac sarcoidosis. Cardiac PET scan 10/24 showed no evidence for active sarcoidosis, LVEF 33%. Echo 10/24 EF 25-30%, RV moderately reduced. NYHA Class I. Dry to Euvolemic on exam, ReDS 19%. BP 112/82.  - Continue Coreg 6.25 mg bid. He did not tolerate higher dose  - Continue dapagliflozin 10 mg daily - Continue spironolactone 25 mg daily.   - Continue Entresto 24/26 bid. No  dose titration today due to low volume status  - Has CRT-P. Followed by Dr. Nelly Laurence. Given persistently low EF he is being considered for upgrade to CRT-D     F/u w/ Dr. Shirlee Latch in 2-3 months   Robbie Lis, PA-C  03/17/2023

## 2023-03-17 NOTE — Progress Notes (Signed)
ReDS Vest / Clip - 03/17/23 1500       ReDS Vest / Clip   Station Marker D    Ruler Value 26    ReDS Value Range Low volume    ReDS Actual Value 19

## 2023-03-21 ENCOUNTER — Ambulatory Visit: Payer: No Typology Code available for payment source

## 2023-03-28 ENCOUNTER — Encounter (HOSPITAL_COMMUNITY): Payer: No Typology Code available for payment source | Admitting: Cardiology

## 2023-03-30 ENCOUNTER — Encounter (HOSPITAL_COMMUNITY): Payer: No Typology Code available for payment source | Admitting: Cardiology

## 2023-04-21 ENCOUNTER — Encounter: Payer: Self-pay | Admitting: Internal Medicine

## 2023-05-10 ENCOUNTER — Ambulatory Visit (INDEPENDENT_AMBULATORY_CARE_PROVIDER_SITE_OTHER): Payer: No Typology Code available for payment source

## 2023-05-10 DIAGNOSIS — I442 Atrioventricular block, complete: Secondary | ICD-10-CM

## 2023-05-10 DIAGNOSIS — I420 Dilated cardiomyopathy: Secondary | ICD-10-CM

## 2023-05-12 LAB — CUP PACEART REMOTE DEVICE CHECK
Battery Remaining Longevity: 98 mo
Battery Voltage: 3.03 V
Brady Statistic AP VP Percent: 1.26 %
Brady Statistic AP VS Percent: 0.01 %
Brady Statistic AS VP Percent: 98.7 %
Brady Statistic AS VS Percent: 0.03 %
Brady Statistic RA Percent Paced: 1.27 %
Brady Statistic RV Percent Paced: 99.96 %
Date Time Interrogation Session: 20241223214904
Implantable Lead Connection Status: 753985
Implantable Lead Connection Status: 753985
Implantable Lead Connection Status: 753985
Implantable Lead Implant Date: 20210805
Implantable Lead Implant Date: 20210805
Implantable Lead Implant Date: 20240624
Implantable Lead Location: 753858
Implantable Lead Location: 753859
Implantable Lead Location: 753860
Implantable Lead Model: 3830
Implantable Lead Model: 4598
Implantable Lead Model: 5076
Implantable Pulse Generator Implant Date: 20240624
Lead Channel Impedance Value: 361 Ohm
Lead Channel Impedance Value: 380 Ohm
Lead Channel Impedance Value: 380 Ohm
Lead Channel Impedance Value: 437 Ohm
Lead Channel Impedance Value: 437 Ohm
Lead Channel Impedance Value: 437 Ohm
Lead Channel Impedance Value: 456 Ohm
Lead Channel Impedance Value: 494 Ohm
Lead Channel Impedance Value: 513 Ohm
Lead Channel Impedance Value: 665 Ohm
Lead Channel Impedance Value: 684 Ohm
Lead Channel Impedance Value: 703 Ohm
Lead Channel Impedance Value: 760 Ohm
Lead Channel Impedance Value: 760 Ohm
Lead Channel Pacing Threshold Amplitude: 0.5 V
Lead Channel Pacing Threshold Amplitude: 1.125 V
Lead Channel Pacing Threshold Amplitude: 1.75 V
Lead Channel Pacing Threshold Pulse Width: 0.4 ms
Lead Channel Pacing Threshold Pulse Width: 0.4 ms
Lead Channel Pacing Threshold Pulse Width: 0.4 ms
Lead Channel Sensing Intrinsic Amplitude: 2 mV
Lead Channel Sensing Intrinsic Amplitude: 2 mV
Lead Channel Sensing Intrinsic Amplitude: 7.75 mV
Lead Channel Setting Pacing Amplitude: 1.5 V
Lead Channel Setting Pacing Amplitude: 2.25 V
Lead Channel Setting Pacing Amplitude: 3 V
Lead Channel Setting Pacing Pulse Width: 0.4 ms
Lead Channel Setting Pacing Pulse Width: 0.4 ms
Lead Channel Setting Sensing Sensitivity: 1.2 mV
Zone Setting Status: 755011
Zone Setting Status: 755011

## 2023-06-06 ENCOUNTER — Ambulatory Visit: Payer: No Typology Code available for payment source | Admitting: Neurology

## 2023-06-16 ENCOUNTER — Encounter (HOSPITAL_COMMUNITY): Payer: Self-pay | Admitting: Cardiology

## 2023-06-16 ENCOUNTER — Ambulatory Visit (HOSPITAL_COMMUNITY)
Admission: RE | Admit: 2023-06-16 | Discharge: 2023-06-16 | Disposition: A | Discharge status: Home / Self Care | Payer: No Typology Code available for payment source | Source: Ambulatory Visit | Attending: Cardiology | Admitting: Cardiology

## 2023-06-16 ENCOUNTER — Other Ambulatory Visit (HOSPITAL_COMMUNITY): Payer: Self-pay

## 2023-06-16 ENCOUNTER — Telehealth (HOSPITAL_COMMUNITY): Payer: Self-pay

## 2023-06-16 VITALS — BP 110/72 | HR 93 | Wt 141.0 lb

## 2023-06-16 DIAGNOSIS — I5022 Chronic systolic (congestive) heart failure: Secondary | ICD-10-CM | POA: Diagnosis present

## 2023-06-16 DIAGNOSIS — D6851 Activated protein C resistance: Secondary | ICD-10-CM | POA: Diagnosis not present

## 2023-06-16 DIAGNOSIS — I428 Other cardiomyopathies: Secondary | ICD-10-CM | POA: Insufficient documentation

## 2023-06-16 DIAGNOSIS — Z79899 Other long term (current) drug therapy: Secondary | ICD-10-CM | POA: Diagnosis not present

## 2023-06-16 DIAGNOSIS — Z8679 Personal history of other diseases of the circulatory system: Secondary | ICD-10-CM | POA: Diagnosis not present

## 2023-06-16 DIAGNOSIS — Z8673 Personal history of transient ischemic attack (TIA), and cerebral infarction without residual deficits: Secondary | ICD-10-CM | POA: Diagnosis not present

## 2023-06-16 DIAGNOSIS — Z7901 Long term (current) use of anticoagulants: Secondary | ICD-10-CM | POA: Diagnosis not present

## 2023-06-16 LAB — BRAIN NATRIURETIC PEPTIDE: B Natriuretic Peptide: 20.9 pg/mL (ref 0.0–100.0)

## 2023-06-16 LAB — BASIC METABOLIC PANEL
Anion gap: 8 (ref 5–15)
BUN: 23 mg/dL — ABNORMAL HIGH (ref 6–20)
CO2: 27 mmol/L (ref 22–32)
Calcium: 9.6 mg/dL (ref 8.9–10.3)
Chloride: 104 mmol/L (ref 98–111)
Creatinine, Ser: 1.01 mg/dL (ref 0.61–1.24)
GFR, Estimated: >60 mL/min (ref >60)
Glucose, Bld: 84 mg/dL (ref 70–99)
Potassium: 4.3 mmol/L (ref 3.5–5.1)
Sodium: 139 mmol/L (ref 135–145)

## 2023-06-16 MED ORDER — ENTRESTO 49-51 MG PO TABS: 1.0000 | ORAL_TABLET | Freq: Two times a day (BID) | ORAL | 3 refills | Status: DC

## 2023-06-16 NOTE — Progress Notes (Addendum)
PCP: Shon Hale, MD HF Cardiology: Dr. Shirlee Latch  58 y.o. 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  Patient developed high grade heart block in 2021 and had a Medtronic PPM with left bundle lead placed.  He is now 99% LB-paced.  Cause of heart block is uncertain.  No history of sarcoidosis or Lyme disease.  CT chest in 3/24 showed no pulmonary findings concerning for sarcoidosis.   Echo in 4/21 showed EF 55-60%.  Patient did well until 3/24 when he had an acute PCA CVA.  Echo in 3/24 showed EF 20-25%, normal RV, mild TR.  TEE showed EF 20-25%, no LV thrombus, and negative bubble study.  He was started on Eliquis due to concern for cardioembolism.   LHC/RHC in 5/24 showed no significant CAD, normal filling pressures, CI 3.32. Cardiac MRI in 5/24 showed mild LV dilation with EF 14%, septal-lateral dyssynchrony, normal RV, nonspecific inferior RV insertion site LGE, small area of mid-wall subepicardial LGE.   Despite left bundle lead, patient had very wide QRS.  He had CS lead placed in 6/24 with significant narrowing of QRS and improvement in symptoms/ exercise tolerance.   Echo 10/24 showed EF 25-30%, moderate RV dysfunction.    Had cardiac PET 10/24 that showed EF 33% and no evidence of active inflammation/sarcoidosis.   Patient returns for followup of CHF.  Here w/ his wife. Weight up 3 lbs.  He is exercising regularly.  No dyspnea with usual activities.  No problems walking up stairs. No lightheadedness. No chest pain. No palpitations.   Medtronic device interrogation: 100% BiV pacing, stable thoracic impedance.   Labs (4/24): K 4.5, creatinine 1.0 Labs (6/24): K 5.1, creatinine 1.13 Labs (9/23): K 4.5, creatinine 1.07  Labs (10/24): K 4.6, creatinine 0.99, BNP 36  PMH: 1. Factor V Leiden heterozygote 2. Seizure disorder 3. High grade heart block: Medtronic PPM with left bundle lead  in 2021.  - Despite left bundle lead, patient had very wide QRS.  He had CS lead placed in 6/24 with significant narrowing of QRS.  4. Hypothyroidism 5. Hyperlipidemia 6. 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  - Negative bubble study.  7. Chronic systolic CHF: Echo in 4/21 with EF 55-60%.  Echo (3/24) with EF 20-25%, normal RV, mild TR. - TEE (3/24): EF 20-25%, no LV thrombus, negative bubble study.  - LHC/RHC (5/24): No significant CAD; mean RA 1, PA 22/4, mean PCWP 11, CI 3.32 - Cardiac MRI (5/24): Mild LV dilation with EF 14%, septal-lateral dyssynchrony, normal RV, nonspecific inferior RV insertion site LGE, small area of mid-wall subepicardial LGE.  - Echo (10/24): EF 25-30%, moderate RV dysfunction.  - Cardiac PET (10/24): EF 33% and no evidence of active inflammation/sarcoidosis.   FH: No cardiomyopathy or sarcoidosis.  Parkinsons in father and brother.   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    ROS: All systems reviewed and negative except as per HPI.   Current Outpatient Medications  Medication Sig Dispense Refill   apixaban (ELIQUIS) 5 MG TABS tablet Take 1 tablet (5 mg total) by mouth 2 (two) times daily. 180 tablet 3   Ascorbic Acid (VITAMIN C) 1000 MG tablet Take 1,000 mg by mouth 2 (two) times daily at 10 AM and 5 PM.     carvedilol (COREG) 6.25 MG tablet Take 1 tablet (6.25 mg  total) by mouth 2 (two) times daily with a meal. HOLD for SBP less than 90 60 tablet 3   dapagliflozin propanediol (FARXIGA) 10 MG TABS tablet Take 1 tablet (10 mg total) by mouth daily before breakfast. 90 tablet 3   lacosamide (VIMPAT) 200 MG TABS tablet Take 1 tablet (200 mg total) by mouth 2 (two) times daily. 60 tablet 2   sacubitril-valsartan (ENTRESTO) 49-51 MG Take 1 tablet by mouth 2 (two) times daily. 180 tablet 3   spironolactone (ALDACTONE) 25 MG tablet Take 1 tablet (25 mg total) by mouth daily. 90 tablet  3   testosterone cypionate (DEPOTESTOSTERONE CYPIONATE) 200 MG/ML injection Inject 0.6 mLs into the muscle See admin instructions. Every 10 days     thyroid (ARMOUR) 60 MG tablet Take 60 mg by mouth daily before breakfast.     No current facility-administered medications for this encounter.   BP 110/72   Pulse 93   Wt 64 kg (141 lb)   SpO2 100%   BMI 19.95 kg/m  PHYSICAL EXAM: General: NAD Neck: No JVD, no thyromegaly or thyroid nodule.  Lungs: Clear to auscultation bilaterally with normal respiratory effort. CV: Nondisplaced PMI.  Heart regular S1/S2, no S3/S4, no murmur.  No peripheral edema.  No carotid bruit.  Normal pedal pulses.  Abdomen: Soft, nontender, no hepatosplenomegaly, no distention.  Skin: Intact without lesions or rashes.  Neurologic: Alert and oriented x 3.  Psych: Normal affect. Extremities: No clubbing or cyanosis.  HEENT: Normal.   Assessment/Plan: 1. High grade heart block: Patient has MDT CRT-P device with CS lead.  He had left bundle lead initially, but QRS was still very wide.  QRS much narrower with CS lead.  He is pacer dependent (>99% BiV paced).  Cause of heart block is uncertain.  Not known to have sarcoidosis or to have had Lyme disease.  CTA chest in 3/24 was not suggestive of pulmonary sarcoidosis. Cardiac PET 10/24 showed no signs of active inflammation/sarcoidosis. LMNA cardiomyopathy or SCN5A cardiomyopathy remain possibilities though he has no significant family history of cardiomyopathy or heart block.  2. CVA: Acute PCA CVA in 3/24 with some residual difficulty with memory and reading. 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  No atrial fibrillation detected and bubble study negative. Of note, he is a Factor V Leiden heterozygote.  - Continue Eliquis due to concern for cardioembolic CVA from occult LV thrombus.  3. Chronic systolic CHF: Nonischemic cardiomyopathy.  Echo in 3/24 with EF 20-25%, normal RV, mild TR.  LHC/RHC in 5/24 showed no  significant coronary disease, normal filling pressures, preserved cardiac output.  Cardiomyopathy may have been triggered by long-term pacemaker-mediated dyssychrony (had LB lead initially but paced QRS was quite wide at 180 msec). CS lead placed in 6/24 with significant narrowing of QRS.  Cardiac MRI in 5/24 showed mild LV dilation with EF 14%, septal-lateral dyssynchrony, normal RV, nonspecific inferior RV insertion site LGE, small area of mid-wall subepicardial LGE. The subepicardial LGE may be related to prior viral myocarditis though given complete heart block, there was concern for cardiac sarcoidosis. Cardiac PET scan 10/24 showed no evidence for active sarcoidosis, LVEF 33%. Echo 10/24 EF 25-30%, RV moderately reduced.  In the absence of cardiac sarcoidosis, heart block + dilated cardiomyopathy raises concern for a genetic cardiomyopathy such as LMNA or SCN5A, but he has no significant family history of heart block/cardiomyopathy.   NYHA class I, not volume overloaded by exam or Optivol.  - Continue Coreg 6.25 mg bid. He did  not tolerate higher dose  - Continue dapagliflozin 10 mg daily - Continue spironolactone 25 mg daily.   - I will try to increase his Entresto to 49/51 bid.  BMET/BNP today and BMET in 10 days.  He will need to stay well-hydrated given issues with orthostasis in the past.  - Has CRT-P. Followed by Dr. Nelly Laurence. I will repeat echo in about 6 months.  If EF remains < 35%, need to consider upgrade to CRT-D.  - We discussed genetic testing specifically to look for LMNA or SCN5A mutations.  Hopefully, this will be available in the near future in a cost-effective manner.   Followup with HF pharmacist in 3 wks for med titration, see APP in 3 months.    I spent 31 minutes reviewing records, interviewing/examining patient, and managing orders.      Marca Ancona,  06/16/2023

## 2023-06-16 NOTE — Telephone Encounter (Signed)
Advanced Heart Failure Patient Advocate Encounter  Patient in office, concerned about cost of Entresto. Test claims show current copay of $672.87 for 30 days. No deductible information is provided by this plan.  Commercial coverage, this pt is eligible to use copay savings card that will bring cost to $10 for 30 days. Advised office to provide copay card.  Luke Jimenez, CPhT Rx Patient Advocate Phone: [phone]

## 2023-06-16 NOTE — Addendum Note (Signed)
Encounter addended by: Laurey Morale, MD on: 06/16/2023 10:09 PM  Actions taken: Clinical Note Signed

## 2023-06-16 NOTE — Patient Instructions (Signed)
INCREASE Entresto to 49/51 mg Twice daily  Labs done today, your results will be available in MyChart, we will contact you for abnormal readings.  Repeat blood work in 10 days.  Please follow up with our heart failure pharmacist in 3 weeks.  Your physician recommends that you schedule a follow-up appointment in: 3 months.  If you have any questions or concerns before your next appointment please send [place] a message through [place] or call our office at [phone].    TO LEAVE A MESSAGE FOR THE NURSE SELECT OPTION 2, PLEASE LEAVE A MESSAGE INCLUDING: YOUR NAME DATE OF BIRTH CALL BACK NUMBER REASON FOR CALL**this is important as we prioritize the call backs  YOU WILL RECEIVE A CALL BACK THE SAME DAY AS LONG AS YOU CALL BEFORE 4:00 PM  At the [facility], you and your health needs are our priority. As part of our continuing mission to provide you with exceptional heart care, we have created designated Provider Care Teams. These Care Teams include your primary Cardiologist (physician) and Advanced Practice Providers (APPs- Physician Assistants and Nurse Practitioners) who all work together to provide you with the care you need, when you need it.   You may see any of the following providers on your designated Care Team at your next follow up: Dr Arvilla Meres Dr Marca Ancona Dr. Dorthula Nettles Dr. Clearnce Hasten Amy Filbert Schilder, NP Robbie Lis, [place] [facility] [place], [place] Brynda Peon, NP [place] Lee, NP Karle Plumber, PharmD   Please be sure to bring in all your medications bottles to every appointment.    Thank you for choosing [facility]

## 2023-06-22 NOTE — Progress Notes (Signed)
 Remote pacemaker transmission.

## 2023-07-10 NOTE — Progress Notes (Incomplete)
***  In Progress*** ? ?  ?Advanced Heart Failure Clinic Note  ? ?HPI:  ? ?Today he returns to HF clinic for pharmacist medication titration. At last visit with MD ***.  ? ?Shortness of breath/dyspnea on exertion? {YES NO:22349}  ?Orthopnea/PND? {YES NO:22349} ?Edema? {YES NO:22349} ?Lightheadedness/dizziness? {YES NO:22349} ?Daily weights at home? {YES NO:22349} ?Blood pressure/heart rate monitoring at home? {YES NO:22349} ?Following low-sodium/fluid-restricted diet? {YES NO:22349} ? ?HF Medications: ? ? ?Has the patient been experiencing any side effects to the medications prescribed?  {YES NO:22349} ? ?Does the patient have any problems obtaining medications due to transportation or finances?   {YES NO:22349} ? ?Understanding of regimen: {excellent/good/fair/poor:19665} ?Understanding of indications: {excellent/good/fair/poor:19665} ?Potential of compliance: {excellent/good/fair/poor:19665} ?Patient understands to avoid NSAIDs. ?Patient understands to avoid decongestants. ?  ? ?Pertinent Lab Values: ?Serum creatinine ***, BUN ***, Potassium ***, Sodium ***, BNP ***, Magnesium ***, Digoxin ***  ? ?Vital Signs: ?Weight: *** (last clinic weight: ***) ?Blood pressure: ***  ?Heart rate: ***  ? ?Assessment/Plan: ?1. Chronic systolic CHF (EF ***), due to ***. NYHA class *** symptoms. ? ? - Basic disease state pathophysiology, medication indication, mechanism and side effects reviewed at length with patient and he verbalized understanding ? ?Follow up *** ? ? ?Audry Riles, PharmD, BCPS, BCCP, CPP ?Heart Failure Clinic Pharmacist ?[phone] ?  ?

## 2023-07-11 ENCOUNTER — Ambulatory Visit (HOSPITAL_COMMUNITY)
Admission: RE | Admit: 2023-07-11 | Discharge: 2023-07-11 | Disposition: A | Discharge status: Home / Self Care | Payer: No Typology Code available for payment source | Source: Ambulatory Visit | Attending: Cardiology | Admitting: Cardiology

## 2023-07-11 VITALS — BP 114/84 | HR 80 | Wt 137.8 lb

## 2023-07-11 DIAGNOSIS — Z950 Presence of cardiac pacemaker: Secondary | ICD-10-CM | POA: Diagnosis not present

## 2023-07-11 DIAGNOSIS — I455 Other specified heart block: Secondary | ICD-10-CM | POA: Insufficient documentation

## 2023-07-11 DIAGNOSIS — R5383 Other fatigue: Secondary | ICD-10-CM | POA: Insufficient documentation

## 2023-07-11 DIAGNOSIS — T465X5A Adverse effect of other antihypertensive drugs, initial encounter: Secondary | ICD-10-CM | POA: Insufficient documentation

## 2023-07-11 DIAGNOSIS — Z7901 Long term (current) use of anticoagulants: Secondary | ICD-10-CM | POA: Diagnosis not present

## 2023-07-11 DIAGNOSIS — I513 Intracardiac thrombosis, not elsewhere classified: Secondary | ICD-10-CM | POA: Insufficient documentation

## 2023-07-11 DIAGNOSIS — I5022 Chronic systolic (congestive) heart failure: Secondary | ICD-10-CM | POA: Diagnosis present

## 2023-07-11 DIAGNOSIS — I69311 Memory deficit following cerebral infarction: Secondary | ICD-10-CM | POA: Insufficient documentation

## 2023-07-11 MED ORDER — SACUBITRIL-VALSARTAN 24-26 MG PO TABS: 1.0000 | ORAL_TABLET | Freq: Two times a day (BID) | ORAL | 3 refills | Status: AC

## 2023-07-11 MED ORDER — CARVEDILOL 6.25 MG PO TABS: 6.2500 mg | ORAL_TABLET | Freq: Two times a day (BID) | ORAL | 3 refills | Status: AC

## 2023-07-11 NOTE — Progress Notes (Addendum)
 Advanced Heart Failure Clinic Note   PCP: Shon Hale, MD HF Cardiology: Dr. 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  Patient developed high grade heart block in 2021 and had a Medtronic PPM with left bundle lead placed.  He is now 99% LB-paced.  Cause of heart block is uncertain.  No history of sarcoidosis or Lyme disease.  CT chest in 07/2022 showed no pulmonary findings concerning for sarcoidosis. ECHO in 08/2019 showed LVEF 55-60%. Patient did well until 07/2022 when he had an acute PCA CVA.  ECHO in 07/2022 showed LVEF 20-25%, normal RV, mild TR. TEE 07/2022 showed LVEF 20-25%, no LV thrombus, and negative bubble study.  He was started on Eliquis due to concern for cardioembolism.    LHC/RHC in 09/2022 showed no significant CAD, normal filling pressures, CI 3.32. Cardiac MRI in 09/2022 showed mild LV dilation with EF 14%, septal-lateral dyssynchrony, normal RV, nonspecific inferior RV insertion site LGE, small area of mid-wall subepicardial LGE.    Despite left bundle lead, patient had very wide QRS. He had CS lead placed in 10/2022 with significant narrowing of QRS and improvement in symptoms/ exercise tolerance.    ECHO 02/2023 showed LVEF 25-30%, moderate RV dysfunction.     Had cardiac PET 02/2023 that showed LVEF 33% and no evidence of active inflammation/sarcoidosis.    On 06/16/2023, patient returned to HF clinic for followup of CHF. His weight was up 3 lbs. He denied any dyspnea with usual activities or problems walking up stairs. Denied lightheadedness, chest pain, and palpitations.     Today he returns to HF clinic for pharmacist medication titration. At last visit with MD his Sherryll Burger was increased to 49/51 mg BID. He reports feeling well overall. His only complaint is increased fatigue and drowsiness that started after his Entresto dose was  increased. He notes thats increasing his PO fluid intake did not help. The patient reports that he had similar intolerances to carvedilol when the dose was previously increased from 6.25 mg BID to 12.5 mg BID, but on a much larger magnitude and in addition to dizziness and lightheadedness. The patient denies experiencing any lightheadedness or dizziness after the increase in [place]. He denies SOB or DOE with regular activties.He reports no PND or orthopnea. He has no LEE. He denies any chest pain or palpitations. His appetite is okay, although he states he has struggled to gain any weight since being discharged from the hospital. His blood pressure at home is ~112/85 mmHg. He reports good adherence to all of his medications. He and his wife report no complications with medication cost. A Farxiga co-pay card was provided after the couple stated that they weren't sure if they had one on file at [facility].   HF Medications: Carvedilol 6.25 mg BID Entresto 49/51 mg BID Spironolactone 25 mg daily Farxiga 10 mg daily  Has the patient been experiencing any side effects to the medications prescribed?  Yes; significantly worsened fatigue after the Entresto dose was increased  Does the patient have any problems obtaining medications due to transportation or finances?   no  Understanding of regimen: excellent Understanding of indications: excellent Potential of compliance: excellent Patient understands to avoid NSAIDs. Patient understands to avoid decongestants.    Pertinent Lab Values: Labs (08/2022): K 4.5, creatinine 1.0 Labs (10/2022): K 5.1, creatinine 1.13 Labs (01/2023):  K 4.5, creatinine 1.07  Labs (02/2023): K 4.6, creatinine 0.99, BNP 36 Labs (05/2023): K 4.3, creatinine 1.01, BNP 29  Vital Signs: Weight: 137 lbs (last clinic weight: 141 lbs) Blood pressure: 114/84 mmHg (last BP: 110/72 mmHg) Heart rate: 80 bpm (last HR: 93 bpm)   Assessment/Plan: 1. High grade heart block: Patient has MDT  CRT-P device with CS lead.  He had left bundle lead initially, but QRS was still very wide.  QRS much narrower with CS lead.  He is pacer dependent (>99% BiV paced).  Cause of heart block is uncertain. Not known to have sarcoidosis or to have had Lyme disease. CTA chest in 07/2022 was not suggestive of pulmonary sarcoidosis. Cardiac PET 02/2023 showed no signs of active inflammation/sarcoidosis. LMNA cardiomyopathy or SCN5A cardiomyopathy remain possibilities though he has no significant family history of cardiomyopathy or heart block.   2. CVA: Acute PCA CVA in 07/2022 with some residual difficulty with memory and reading. 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  No atrial fibrillation detected and bubble study negative. Of note, he is a Factor V Leiden heterozygote. - Continue Eliquis due to concern for cardioembolic CVA from occult LV thrombus.  3. Chronic systolic CHF: Nonischemic cardiomyopathy.  ECHO in 07/2022 with LVEF 20-25%, normal RV, mild TR.  LHC/RHC in 09/2022 showed no significant coronary disease, normal filling pressures, preserved cardiac output.  Cardiomyopathy may have been triggered by long-term pacemaker-mediated dyssychrony (had LB lead initially but paced QRS was quite wide at 180 msec). CS lead placed in 10/2022 with significant narrowing of QRS.  Cardiac MRI in 09/2022 showed mild LV dilation with LVEF 14%, septal-lateral dyssynchrony, normal RV, nonspecific inferior RV insertion site LGE, small area of mid-wall subepicardial LGE. The subepicardial LGE may be related to prior viral myocarditis though given complete heart block, there was concern for cardiac sarcoidosis. Cardiac PET scan 02/2023 showed no evidence for active sarcoidosis, LVEF 33%. ECHO 02/2023 LVEF 25-30%, RV moderately reduced.  In the absence of cardiac sarcoidosis, heart block + dilated cardiomyopathy raises concern for a genetic cardiomyopathy such as LMNA or SCN5A, but he has no significant family history of  heart block/cardiomyopathy.   - NYHA class I, not volume overloaded by exam -Continue carvedilol 6.25 mg bid. He has not tolerated higher doses due to documented intolerance (fatigue, lightheadedness, dizziness). -Notes significant fatigue affecting his ADLs since Entresto was increased. Decrease Entresto back to 24/26 mg BID. -Continue spironolactone 25 mg daily. -Continue dapagliflozin 10 mg daily. -Has CRT-P. Followed by Dr. Nelly Laurence. Repeat ECHO 09/2023. If EF remains < 35%, need to consider upgrade to CRT-D.  -Previously discussed genetic testing with Dr. Shirlee Latch specifically to look for LMNA or SCN5A mutations.  Hopefully, this will be available in the near future in a cost-effective manner.    Follow-up with HF APP 2 months (09/15/2023)   Wilmer Floor, PharmD PGY2 Cardiology Pharmacy Resident  Karle Plumber, PharmD, BCPS, [facility], CPP Heart Failure Clinic Pharmacist [phone]

## 2023-07-11 NOTE — Patient Instructions (Addendum)
 It was a pleasure seeing you today!  MEDICATIONS: -We are changing your medications today -Decrease Entresto to 24/26 mg  (1 tablet) twice daily; you may take a half tablet twice daily of Entresto 49/51 mg -Call if you have questions about your medications.  NEXT APPOINTMENT: Return to clinic on 09/15/23 with the APP.  In general, to take care of your heart failure: -Limit your fluid intake to 2 Liters (half-gallon) per day.   -Limit your salt intake to ideally 2-3 grams (2000-3000 mg) per day. -Weigh yourself daily and record, and bring that "weight diary" to your next appointment.  (Weight gain of 2-3 pounds in 1 day typically means fluid weight.) -The medications for your heart are to help your heart and help you live longer.   -Please contact us before stopping any of your heart medications.  Call the clinic at [phone] with questions or to reschedule future appointments.

## 2023-07-12 ENCOUNTER — Other Ambulatory Visit (HOSPITAL_COMMUNITY): Payer: Self-pay | Admitting: Cardiology

## 2023-08-09 ENCOUNTER — Ambulatory Visit (INDEPENDENT_AMBULATORY_CARE_PROVIDER_SITE_OTHER): Payer: No Typology Code available for payment source

## 2023-08-09 DIAGNOSIS — I5022 Chronic systolic (congestive) heart failure: Secondary | ICD-10-CM

## 2023-08-10 LAB — CUP PACEART REMOTE DEVICE CHECK
Battery Remaining Longevity: 93 mo
Battery Voltage: 3.01 V
Brady Statistic AP VP Percent: 0.8 %
Brady Statistic AP VS Percent: 0.01 %
Brady Statistic AS VP Percent: 99.16 %
Brady Statistic AS VS Percent: 0.03 %
Brady Statistic RA Percent Paced: 0.8 %
Brady Statistic RV Percent Paced: 99.96 %
Date Time Interrogation Session: 20250324230132
Implantable Lead Connection Status: 753985
Implantable Lead Connection Status: 753985
Implantable Lead Connection Status: 753985
Implantable Lead Implant Date: 20210805
Implantable Lead Implant Date: 20210805
Implantable Lead Implant Date: 20240624
Implantable Lead Location: 753858
Implantable Lead Location: 753859
Implantable Lead Location: 753860
Implantable Lead Model: 3830
Implantable Lead Model: 4598
Implantable Lead Model: 5076
Implantable Pulse Generator Implant Date: 20240624
Lead Channel Impedance Value: 342 Ohm
Lead Channel Impedance Value: 342 Ohm
Lead Channel Impedance Value: 361 Ohm
Lead Channel Impedance Value: 380 Ohm
Lead Channel Impedance Value: 399 Ohm
Lead Channel Impedance Value: 399 Ohm
Lead Channel Impedance Value: 437 Ohm
Lead Channel Impedance Value: 456 Ohm
Lead Channel Impedance Value: 475 Ohm
Lead Channel Impedance Value: 589 Ohm
Lead Channel Impedance Value: 589 Ohm
Lead Channel Impedance Value: 646 Ohm
Lead Channel Impedance Value: 684 Ohm
Lead Channel Impedance Value: 684 Ohm
Lead Channel Pacing Threshold Amplitude: 0.5 V
Lead Channel Pacing Threshold Amplitude: 1.25 V
Lead Channel Pacing Threshold Amplitude: 1.75 V
Lead Channel Pacing Threshold Pulse Width: 0.4 ms
Lead Channel Pacing Threshold Pulse Width: 0.4 ms
Lead Channel Pacing Threshold Pulse Width: 0.4 ms
Lead Channel Sensing Intrinsic Amplitude: 2.25 mV
Lead Channel Sensing Intrinsic Amplitude: 2.25 mV
Lead Channel Sensing Intrinsic Amplitude: 7.75 mV
Lead Channel Setting Pacing Amplitude: 1.5 V
Lead Channel Setting Pacing Amplitude: 2.25 V
Lead Channel Setting Pacing Amplitude: 3 V
Lead Channel Setting Pacing Pulse Width: 0.4 ms
Lead Channel Setting Pacing Pulse Width: 0.4 ms
Lead Channel Setting Sensing Sensitivity: 1.2 mV
Zone Setting Status: 755011
Zone Setting Status: 755011

## 2023-08-12 ENCOUNTER — Other Ambulatory Visit (HOSPITAL_COMMUNITY): Payer: Self-pay | Admitting: Cardiology

## 2023-08-12 MED ORDER — SPIRONOLACTONE 25 MG PO TABS: 25.0000 mg | ORAL_TABLET | Freq: Every day | ORAL | 3 refills | Status: AC

## 2023-08-15 NOTE — Progress Notes (Unsigned)
  Electrophysiology Office Note:   ID:  Luke Jimenez, DOB [birth year], MRN [id number]  Primary Cardiologist: Donato Schultz, MD Electrophysiologist: Maurice Small, MD  {Click to update primary MD,subspecialty MD or APP then REFRESH:1}    History of Present Illness:   Luke Jimenez is a 58 y.o. male with h/o symptomatic bradycardia, chronic systolic CHF, h/o CVA,  seen today for routine electrophysiology followup.   Since last being seen in our clinic the patient reports doing ***.  he denies chest pain, palpitations, dyspnea, PND, orthopnea, nausea, vomiting, dizziness, syncope, edema, weight gain, or early satiety.   Review of systems complete and found to be negative unless listed in HPI.   EP Information / Studies Reviewed:    EKG is not ordered today. EKG from 06/16/2023 reviewed which showed ***       PPM Interrogation-  reviewed in detail today,  See PACEART report.  Arrhythmia/Device History Medtronic Dual Chamber PPM 12/2019 for second degree AV block   Physical Exam:   VS:  There were no vitals taken for this visit.   Wt Readings from Last 3 Encounters:  07/11/23 137 lb 12.8 oz (62.5 kg)  06/16/23 141 lb (64 kg)  03/17/23 138 lb (62.6 kg)     GEN: No acute distress  NECK: No JVD; No carotid bruits CARDIAC: {EPRHYTHM:28826}, no murmurs, rubs, gallops RESPIRATORY:  Clear to auscultation without rales, wheezing or rhonchi  ABDOMEN: Soft, non-tender, non-distended EXTREMITIES:  {EDEMA LEVEL:28147::"No"} edema; No deformity   ASSESSMENT AND PLAN:    Symptomatic bradycardia s/p Medtronic PPM  Normal PPM function See Pace Art report No changes today  Chronic systolic CHF Echo 02/2023 with LVEF 25-30%.  Per notes plan for repeat Echo this summer, if EF remains <35% may need to consider addition of HV lead. He currently has an RA, RV(left bundle), and LV/CS lead in place.   {Click here to Review PMH, Prob List, Meds, Allergies, SHx, FHx  :1}   Disposition:   Follow up  with {EPPROVIDERS:28135} {EPFOLLOW UP:28173}  Signed, Graciella Freer, PA-C

## 2023-08-16 ENCOUNTER — Encounter: Payer: Self-pay | Admitting: Student

## 2023-08-16 ENCOUNTER — Ambulatory Visit: Attending: Student | Admitting: Student

## 2023-08-16 VITALS — BP 110/80 | HR 92 | Ht 70.5 in | Wt 137.4 lb

## 2023-08-16 DIAGNOSIS — I442 Atrioventricular block, complete: Secondary | ICD-10-CM

## 2023-08-16 DIAGNOSIS — I5022 Chronic systolic (congestive) heart failure: Secondary | ICD-10-CM | POA: Diagnosis not present

## 2023-08-16 DIAGNOSIS — I420 Dilated cardiomyopathy: Secondary | ICD-10-CM

## 2023-08-16 LAB — CUP PACEART INCLINIC DEVICE CHECK
Battery Remaining Longevity: 95 mo
Battery Voltage: 3.01 V
Brady Statistic AP VP Percent: 0.75 %
Brady Statistic AP VS Percent: 0.01 %
Brady Statistic AS VP Percent: 99.21 %
Brady Statistic AS VS Percent: 0.03 %
Brady Statistic RA Percent Paced: 0.76 %
Brady Statistic RV Percent Paced: 99.96 %
Date Time Interrogation Session: 20250401085624
Implantable Lead Connection Status: 753985
Implantable Lead Connection Status: 753985
Implantable Lead Connection Status: 753985
Implantable Lead Implant Date: 20210805
Implantable Lead Implant Date: 20210805
Implantable Lead Implant Date: 20240624
Implantable Lead Location: 753858
Implantable Lead Location: 753859
Implantable Lead Location: 753860
Implantable Lead Model: 3830
Implantable Lead Model: 4598
Implantable Lead Model: 5076
Implantable Pulse Generator Implant Date: 20240624
Lead Channel Impedance Value: 361 Ohm
Lead Channel Impedance Value: 380 Ohm
Lead Channel Impedance Value: 380 Ohm
Lead Channel Impedance Value: 399 Ohm
Lead Channel Impedance Value: 418 Ohm
Lead Channel Impedance Value: 437 Ohm
Lead Channel Impedance Value: 437 Ohm
Lead Channel Impedance Value: 494 Ohm
Lead Channel Impedance Value: 494 Ohm
Lead Channel Impedance Value: 646 Ohm
Lead Channel Impedance Value: 684 Ohm
Lead Channel Impedance Value: 703 Ohm
Lead Channel Impedance Value: 722 Ohm
Lead Channel Impedance Value: 741 Ohm
Lead Channel Pacing Threshold Amplitude: 0.625 V
Lead Channel Pacing Threshold Amplitude: 1.25 V
Lead Channel Pacing Threshold Amplitude: 1.875 V
Lead Channel Pacing Threshold Pulse Width: 0.4 ms
Lead Channel Pacing Threshold Pulse Width: 0.4 ms
Lead Channel Pacing Threshold Pulse Width: 0.4 ms
Lead Channel Sensing Intrinsic Amplitude: 2 mV
Lead Channel Sensing Intrinsic Amplitude: 2.625 mV
Lead Channel Sensing Intrinsic Amplitude: 7.75 mV
Lead Channel Setting Pacing Amplitude: 1.5 V
Lead Channel Setting Pacing Amplitude: 2.25 V
Lead Channel Setting Pacing Amplitude: 3 V
Lead Channel Setting Pacing Pulse Width: 0.4 ms
Lead Channel Setting Pacing Pulse Width: 0.4 ms
Lead Channel Setting Sensing Sensitivity: 1.2 mV
Zone Setting Status: 755011
Zone Setting Status: 755011

## 2023-08-16 NOTE — Patient Instructions (Signed)
 Medication Instructions:  Your physician recommends that you continue on your current medications as directed. Please refer to the Current Medication list given to you today.  *If you need a refill on your cardiac medications before your next appointment, please call your pharmacy*  Lab Work: None ordered If you have labs (blood work) drawn today and your tests are completely normal, you will receive your results only by: MyChart Message (if you have MyChart) OR A paper copy in the mail If you have any lab test that is abnormal or we need to change your treatment, we will call you to review the results.  Follow-Up: At [facility], you and your health needs are our priority.  As part of our continuing mission to provide you with exceptional heart care, our providers are all part of one team.  This team includes your primary Cardiologist (physician) and Advanced Practice Providers or APPs (Physician Assistants and Nurse Practitioners) who all work together to provide you with the care you need, when you need it.  Your next appointment:   1 year(s)  Provider:   Casimiro Needle "Mardelle Matte" Lanna Poche, PA-C       1st Floor: - Lobby - Registration  - Pharmacy  - Lab - Cafe  2nd Floor: - PV Lab - Diagnostic Testing (echo, CT, nuclear med)  3rd Floor: - Vacant  4th Floor: - TCTS (cardiothoracic surgery) - AFib Clinic - Structural Heart Clinic - Vascular Surgery  - Vascular Ultrasound  5th Floor: - HeartCare Cardiology (general and EP) - Clinical Pharmacy for coumadin, hypertension, lipid, weight-loss medications, and med management appointments    Valet parking services will be available as well.

## 2023-08-19 ENCOUNTER — Encounter: Payer: Self-pay | Admitting: Cardiovascular Disease

## 2023-08-25 ENCOUNTER — Encounter: Payer: Self-pay | Admitting: Cardiovascular Disease

## 2023-09-05 NOTE — Progress Notes (Signed)
 ADVANCED HF CLINIC NOTE   PCP: Ransom Byers, MD HF Cardiology: Dr. Mitzie Anda  58 y.o. 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  Patient developed high grade heart block in 2021 and had a Medtronic PPM with left bundle lead placed. Cause of heart block is uncertain.  No history of sarcoidosis or Lyme disease.  CT chest in 3/24 showed no pulmonary findings concerning for sarcoidosis.   Echo 4/21: EF 55-60%.  Patient did well until 3/24 when he had an acute PCA CVA.  Echo 3/24: EF 20-25%, normal RV, mild TR.  TEE showed EF 20-25%, no LV thrombus, and negative bubble study.  He was started on Eliquis  due to concern for cardioembolism.   LHC/RHC 5/24: no significant CAD, normal filling pressures, CI 3.32. Cardiac MRI 5/24: mild LV dilation with EF 14%, septal-lateral dyssynchrony, normal RV, nonspecific inferior RV insertion site LGE, small area of mid-wall subepicardial LGE.   Despite left bundle lead, patient had very wide QRS.  He had CS lead placed in 6/24 with significant narrowing of QRS and improvement in symptoms/ exercise tolerance.   Echo 10/24: EF 25-30%, moderate RV dysfunction.    Cardiac PET 10/24: EF 33% and no evidence of active inflammation/sarcoidosis.   Today he returns for AHF follow up with his wife. They have been very busy taking care of their 39 year old grandson full time as their daughter is working on her pediatric residency and her husband is deployed. Overall feeling ok, just tired from waking up during the night to occasionally give grandson a bottle. Denies palpitations, CP, edema, or PND/Orthopnea. Occasional dizziness with brisk movement. Denies SOB. Appetite ok, they watch what they eat. No fever or chills. Weight at home 133-136 pounds. Taking all medications. Denies ETOH, tobacco or drug use.   Medtronic device interrogation: 100% BiV pacing, stable thoracic impedance and Optivol. Active 5.5 hr/day.    PMH: 1.  Factor V Leiden heterozygote 2. Seizure disorder 3. High grade heart block: Medtronic PPM with left bundle lead in 2021.  - Despite left bundle lead, patient had very wide QRS.  He had CS lead placed in 6/24 with significant narrowing of QRS.  4. Hypothyroidism 5. Hyperlipidemia 6. CVA: 3/24 acute PCA CVA, started on Eliquis  with cardiomyopathy.  - Negative bubble study.  7. Chronic systolic CHF: Echo in 4/21 with EF 55-60%.  Echo (3/24) with EF 20-25%, normal RV, mild TR. - TEE (3/24): EF 20-25%, no LV thrombus, negative bubble study.  - LHC/RHC (5/24): No significant CAD; mean RA 1, PA 22/4, mean PCWP 11, CI 3.32 - Cardiac MRI (5/24): Mild LV dilation with EF 14%, septal-lateral dyssynchrony, normal RV, nonspecific inferior RV insertion site LGE, small area of mid-wall subepicardial LGE.  - Echo (10/24): EF 25-30%, moderate RV dysfunction.  - Cardiac PET (10/24): EF 33% and no evidence of active inflammation/sarcoidosis.   FH: No cardiomyopathy or sarcoidosis.  Parkinsons in father and brother.   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    ROS: All systems reviewed and negative except as per HPI.   Current Outpatient Medications  Medication Sig Dispense Refill   apixaban  (ELIQUIS ) 5 MG TABS tablet Take 1 tablet (5 mg total) by mouth 2 (two) times daily. 180 tablet 3   Ascorbic Acid (VITAMIN C) 1000 MG tablet Take 1,000 mg by mouth daily.     carvedilol  (COREG ) 6.25 MG tablet Take 1 tablet (  6.25 mg total) by mouth 2 (two) times daily with a meal. HOLD for SBP less than 90 180 tablet 3   dapagliflozin  propanediol (FARXIGA ) 10 MG TABS tablet Take 1 tablet (10 mg total) by mouth daily before breakfast. 90 tablet 3   lacosamide  (VIMPAT ) 200 MG TABS tablet Take 1 tablet (200 mg total) by mouth 2 (two) times daily. 60 tablet 2   sacubitril -valsartan  (ENTRESTO ) 24-26 MG Take 1 tablet by mouth 2 (two) times daily. 180 tablet 3    spironolactone  (ALDACTONE ) 25 MG tablet Take 1 tablet (25 mg total) by mouth daily. 90 tablet 3   testosterone cypionate (DEPOTESTOSTERONE CYPIONATE) 200 MG/ML injection Inject 0.6 mLs into the muscle See admin instructions. Every 10 days     thyroid  (ARMOUR) 60 MG tablet Take 60 mg by mouth daily before breakfast.     No current facility-administered medications for this encounter.   BP 106/82   Pulse 84   Ht 5\' 10"  (1.778 m)   Wt 61.6 kg (135 lb 12.8 oz)   SpO2 98%   BMI 19.49 kg/m  PHYSICAL EXAM: General:  well appearing.  No respiratory difficulty. Walked into clinic Neck: supple. JVD flat cm.  Cor: PMI nondisplaced. Regular rate & rhythm. No rubs, gallops or murmurs. Lungs: clear Extremities: no cyanosis, clubbing, rash, edema  Neuro: alert & oriented x 3. Moves all 4 extremities w/o difficulty. Affect pleasant.   Wt Readings from Last 3 Encounters:  09/15/23 61.6 kg (135 lb 12.8 oz)  08/16/23 62.3 kg (137 lb 6.4 oz)  07/11/23 62.5 kg (137 lb 12.8 oz)    Assessment/Plan: 1. High grade heart block: Patient has MDT CRT-P device with CS lead.  He had left bundle lead initially, but QRS was still very wide.  QRS much narrower with CS lead.  He is pacer dependent (>99% BiV paced).  Cause of heart block is uncertain.  Not known to have sarcoidosis or to have had Lyme disease.  CTA chest in 3/24 was not suggestive of pulmonary sarcoidosis. Cardiac PET 10/24 showed no signs of active inflammation/sarcoidosis. LMNA cardiomyopathy or SCN5A cardiomyopathy remain possibilities though he has no significant family history of cardiomyopathy or heart block.   2. CVA: Acute PCA CVA in 3/24 with some residual difficulty with memory and reading. 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  No atrial fibrillation detected and bubble study negative. Of note, he is a Factor V Leiden heterozygote.  - Continue Eliquis  due to concern for cardioembolic CVA from occult LV thrombus.   3. Chronic  systolic CHF: Nonischemic cardiomyopathy.  Echo 3/24: EF 20-25%, normal RV, mild TR.  LHC/RHC 5/24: no significant coronary disease, normal filling pressures, preserved CO.  Cardiomyopathy may have been triggered by long-term pacemaker-mediated dyssychrony (had LB lead initially but paced QRS was quite wide at 180 msec). CS lead placed in 6/24 with significant narrowing of QRS.  Cardiac MRI 5/24' mild LV dilation with EF 14%, septal-lateral dyssynchrony, normal RV, nonspecific inferior RV insertion site LGE, small area of mid-wall subepicardial LGE. The subepicardial LGE may be related to prior viral myocarditis though given CHB, there was concern for cardiac sarcoidosis. Cardiac PET scan 10/24 showed no evidence for active sarcoidosis, LVEF 33%. Echo 10/24 EF 25-30%, RV moderately reduced.  In the absence of cardiac sarcoidosis, heart block + dilated cardiomyopathy raises concern for a genetic cardiomyopathy such as LMNA or SCN5A, but he has no significant family history of heart block/cardiomyopathy.   - NYHA class I, not volume overloaded  by exam or Optivol.  - Continue Coreg  6.25 mg bid. He did not tolerate higher dose  - Continue dapagliflozin  10 mg daily - Continue spironolactone  25 mg daily.   - Continue Entresto  to 24-26 bid.  Did not tolerate higher dose.  - Has CRT-P. Followed by Dr. Arlester Ladd. Update echo.  If EF remains < 35%, need to consider upgrade to CRT-D.  - We discussed genetic testing specifically to look for LMNA or SCN5A mutations.  Hopefully, this will be available in the near future in a cost-effective manner.  -Labs today  Follow up in 6 months with Dr. Mitzie Anda + echo    Sheryl Donna AGACNP-BC  09/15/2023

## 2023-09-14 ENCOUNTER — Telehealth (HOSPITAL_COMMUNITY): Payer: Self-pay | Admitting: *Deleted

## 2023-09-14 NOTE — Telephone Encounter (Signed)
 Called to confirm/remind patient of their appointment at the Advanced Heart Failure Clinic on 08/19/23***.   Appointment:   [x] Confirmed  [] Left mess   [] No answer/No voice mail  [] Phone not in service  Patient reminded to bring all medications and/or complete list.  Confirmed patient has transportation. Gave directions, instructed to utilize valet parking.

## 2023-09-15 ENCOUNTER — Encounter (HOSPITAL_COMMUNITY): Payer: Self-pay

## 2023-09-15 ENCOUNTER — Ambulatory Visit (HOSPITAL_COMMUNITY)
Admission: RE | Admit: 2023-09-15 | Discharge: 2023-09-15 | Disposition: A | Discharge status: Home / Self Care | Payer: No Typology Code available for payment source | Source: Ambulatory Visit | Attending: Internal Medicine | Admitting: Internal Medicine

## 2023-09-15 VITALS — BP 106/82 | HR 84 | Ht 70.0 in | Wt 135.8 lb

## 2023-09-15 DIAGNOSIS — I455 Other specified heart block: Secondary | ICD-10-CM | POA: Diagnosis present

## 2023-09-15 DIAGNOSIS — I361 Nonrheumatic tricuspid (valve) insufficiency: Secondary | ICD-10-CM | POA: Diagnosis not present

## 2023-09-15 DIAGNOSIS — I442 Atrioventricular block, complete: Secondary | ICD-10-CM | POA: Diagnosis not present

## 2023-09-15 DIAGNOSIS — Z950 Presence of cardiac pacemaker: Secondary | ICD-10-CM | POA: Insufficient documentation

## 2023-09-15 DIAGNOSIS — I6310 Cerebral infarction due to embolism of unspecified precerebral artery: Secondary | ICD-10-CM

## 2023-09-15 DIAGNOSIS — Z7901 Long term (current) use of anticoagulants: Secondary | ICD-10-CM | POA: Insufficient documentation

## 2023-09-15 DIAGNOSIS — I5022 Chronic systolic (congestive) heart failure: Secondary | ICD-10-CM

## 2023-09-15 DIAGNOSIS — I428 Other cardiomyopathies: Secondary | ICD-10-CM | POA: Insufficient documentation

## 2023-09-15 DIAGNOSIS — Z8673 Personal history of transient ischemic attack (TIA), and cerebral infarction without residual deficits: Secondary | ICD-10-CM | POA: Diagnosis not present

## 2023-09-15 DIAGNOSIS — I509 Heart failure, unspecified: Secondary | ICD-10-CM | POA: Diagnosis present

## 2023-09-15 DIAGNOSIS — Z79899 Other long term (current) drug therapy: Secondary | ICD-10-CM | POA: Diagnosis not present

## 2023-09-15 LAB — BASIC METABOLIC PANEL WITH GFR
Anion gap: 7 (ref 5–15)
BUN: 23 mg/dL — ABNORMAL HIGH (ref 6–20)
CO2: 26 mmol/L (ref 22–32)
Calcium: 9.5 mg/dL (ref 8.9–10.3)
Chloride: 105 mmol/L (ref 98–111)
Creatinine, Ser: 1.01 mg/dL (ref 0.61–1.24)
GFR, Estimated: >60 mL/min (ref >60)
Glucose, Bld: 86 mg/dL (ref 70–99)
Potassium: 4.4 mmol/L (ref 3.5–5.1)
Sodium: 138 mmol/L (ref 135–145)

## 2023-09-15 LAB — BRAIN NATRIURETIC PEPTIDE: B Natriuretic Peptide: 21.7 pg/mL (ref 0.0–100.0)

## 2023-09-15 NOTE — Patient Instructions (Signed)
 Medication Changes:  No Changes In Medications at this time.   Lab Work:  Labs done today, your results will be available in MyChart, we will contact you for abnormal readings.  Follow-Up in: 6 MONTHS WITH AN ECHO PLEASE CALL OUR OFFICE AROUND AUGUST TO GET SCHEDULED FOR YOUR APPOINTMENT. PHONE NUMBER IS [phone] OPTION 2   At the Advanced Heart Failure Clinic, you and your health needs are our priority. We have a designated team specialized in the treatment of Heart Failure. This Care Team includes your primary Heart Failure Specialized Cardiologist (physician), Advanced Practice Providers (APPs- Physician Assistants and Nurse Practitioners), and Pharmacist who all work together to provide you with the care you need, when you need it.   You may see any of the following providers on your designated Care Team at your next follow up:  Dr. Jules Oar Dr. Peder Bourdon Dr. Alwin Baars Dr. Judyth Nunnery Nieves Bars, NP Ruddy Corral, [place] [facility] [place], [place] Dennise Fitz, NP [place] Lee, NP Luster Salters, PharmD   Please be sure to bring in all your medications bottles to every appointment.   Need to Contact Us :  If you have any questions or concerns before your next appointment please send us  a message through [place] or call our office at [phone].    TO LEAVE A MESSAGE FOR THE NURSE SELECT OPTION 2, PLEASE LEAVE A MESSAGE INCLUDING: YOUR NAME DATE OF BIRTH CALL BACK NUMBER REASON FOR CALL**this is important as we prioritize the call backs  YOU WILL RECEIVE A CALL BACK THE SAME DAY AS LONG AS YOU CALL BEFORE 4:00 PM

## 2023-09-23 NOTE — Progress Notes (Signed)
 Remote pacemaker transmission.

## 2023-09-23 NOTE — Addendum Note (Signed)
 Addended by: Lott Rouleau A on: 09/23/2023 03:14 PM   Modules accepted: Orders

## 2023-09-29 ENCOUNTER — Encounter: Payer: Self-pay | Admitting: Internal Medicine

## 2023-11-08 ENCOUNTER — Ambulatory Visit (INDEPENDENT_AMBULATORY_CARE_PROVIDER_SITE_OTHER): Payer: No Typology Code available for payment source

## 2023-11-08 DIAGNOSIS — I5022 Chronic systolic (congestive) heart failure: Secondary | ICD-10-CM

## 2023-11-08 LAB — CUP PACEART REMOTE DEVICE CHECK
Battery Remaining Longevity: 92 mo
Battery Voltage: 3 V
Brady Statistic AP VP Percent: 1.38 %
Brady Statistic AP VS Percent: 0.01 %
Brady Statistic AS VP Percent: 98.57 %
Brady Statistic AS VS Percent: 0.04 %
Brady Statistic RA Percent Paced: 1.38 %
Brady Statistic RV Percent Paced: 99.95 %
Date Time Interrogation Session: 20250623201943
Implantable Lead Connection Status: 753985
Implantable Lead Connection Status: 753985
Implantable Lead Connection Status: 753985
Implantable Lead Implant Date: 20210805
Implantable Lead Implant Date: 20210805
Implantable Lead Implant Date: 20240624
Implantable Lead Location: 753858
Implantable Lead Location: 753859
Implantable Lead Location: 753860
Implantable Lead Model: 3830
Implantable Lead Model: 4598
Implantable Lead Model: 5076
Implantable Pulse Generator Implant Date: 20240624
Lead Channel Impedance Value: 342 Ohm
Lead Channel Impedance Value: 361 Ohm
Lead Channel Impedance Value: 361 Ohm
Lead Channel Impedance Value: 399 Ohm
Lead Channel Impedance Value: 418 Ohm
Lead Channel Impedance Value: 418 Ohm
Lead Channel Impedance Value: 437 Ohm
Lead Channel Impedance Value: 494 Ohm
Lead Channel Impedance Value: 494 Ohm
Lead Channel Impedance Value: 646 Ohm
Lead Channel Impedance Value: 665 Ohm
Lead Channel Impedance Value: 684 Ohm
Lead Channel Impedance Value: 703 Ohm
Lead Channel Impedance Value: 703 Ohm
Lead Channel Pacing Threshold Amplitude: 0.5 V
Lead Channel Pacing Threshold Amplitude: 1.125 V
Lead Channel Pacing Threshold Amplitude: 1.75 V
Lead Channel Pacing Threshold Pulse Width: 0.4 ms
Lead Channel Pacing Threshold Pulse Width: 0.4 ms
Lead Channel Pacing Threshold Pulse Width: 0.4 ms
Lead Channel Sensing Intrinsic Amplitude: 2.375 mV
Lead Channel Sensing Intrinsic Amplitude: 2.375 mV
Lead Channel Sensing Intrinsic Amplitude: 7.75 mV
Lead Channel Setting Pacing Amplitude: 1.5 V
Lead Channel Setting Pacing Amplitude: 2.25 V
Lead Channel Setting Pacing Amplitude: 3 V
Lead Channel Setting Pacing Pulse Width: 0.4 ms
Lead Channel Setting Pacing Pulse Width: 0.4 ms
Lead Channel Setting Sensing Sensitivity: 1.2 mV
Zone Setting Status: 755011
Zone Setting Status: 755011

## 2023-11-16 ENCOUNTER — Ambulatory Visit: Payer: Self-pay | Admitting: Cardiovascular Disease

## 2024-01-19 ENCOUNTER — Ambulatory Visit (HOSPITAL_COMMUNITY): Payer: Self-pay | Admitting: Cardiology

## 2024-01-19 ENCOUNTER — Encounter (HOSPITAL_COMMUNITY): Payer: Self-pay | Admitting: Cardiology

## 2024-01-19 ENCOUNTER — Ambulatory Visit (HOSPITAL_COMMUNITY)
Admission: RE | Admit: 2024-01-19 | Discharge: 2024-01-19 | Disposition: A | Discharge status: Home / Self Care | Source: Ambulatory Visit | Attending: Internal Medicine | Admitting: Internal Medicine

## 2024-01-19 ENCOUNTER — Ambulatory Visit (HOSPITAL_BASED_OUTPATIENT_CLINIC_OR_DEPARTMENT_OTHER)
Admission: RE | Admit: 2024-01-19 | Discharge: 2024-01-19 | Disposition: A | Discharge status: Home / Self Care | Source: Ambulatory Visit | Attending: Cardiology | Admitting: Cardiology

## 2024-01-19 VITALS — BP 100/66 | HR 111 | Ht 70.0 in | Wt 138.2 lb

## 2024-01-19 DIAGNOSIS — I5022 Chronic systolic (congestive) heart failure: Secondary | ICD-10-CM | POA: Diagnosis present

## 2024-01-19 DIAGNOSIS — Z8673 Personal history of transient ischemic attack (TIA), and cerebral infarction without residual deficits: Secondary | ICD-10-CM | POA: Diagnosis not present

## 2024-01-19 DIAGNOSIS — D6851 Activated protein C resistance: Secondary | ICD-10-CM | POA: Insufficient documentation

## 2024-01-19 DIAGNOSIS — Z950 Presence of cardiac pacemaker: Secondary | ICD-10-CM | POA: Diagnosis not present

## 2024-01-19 DIAGNOSIS — I428 Other cardiomyopathies: Secondary | ICD-10-CM | POA: Insufficient documentation

## 2024-01-19 DIAGNOSIS — Z7901 Long term (current) use of anticoagulants: Secondary | ICD-10-CM | POA: Diagnosis not present

## 2024-01-19 DIAGNOSIS — I340 Nonrheumatic mitral (valve) insufficiency: Secondary | ICD-10-CM | POA: Diagnosis not present

## 2024-01-19 DIAGNOSIS — Z79899 Other long term (current) drug therapy: Secondary | ICD-10-CM | POA: Diagnosis not present

## 2024-01-19 LAB — LIPID PANEL
Cholesterol: 239 mg/dL — ABNORMAL HIGH (ref 0–200)
HDL: 61 mg/dL (ref >40)
LDL Cholesterol: 154 mg/dL — ABNORMAL HIGH (ref 0–99)
Total CHOL/HDL Ratio: 3.9 ratio
Triglycerides: 119 mg/dL (ref <150)
VLDL: 24 mg/dL (ref 0–40)

## 2024-01-19 LAB — ECHOCARDIOGRAM COMPLETE
Area-P 1/2: 4.21 cm2
S' Lateral: 3.9 cm
Single Plane A2C EF: 43.2 %

## 2024-01-19 LAB — CBC
HCT: 53.7 % — ABNORMAL HIGH (ref 39.0–52.0)
Hemoglobin: 18.3 g/dL — ABNORMAL HIGH (ref 13.0–17.0)
MCH: 32.4 pg (ref 26.0–34.0)
MCHC: 34.1 g/dL (ref 30.0–36.0)
MCV: 95.2 fL (ref 80.0–100.0)
Platelets: 244 K/uL (ref 150–400)
RBC: 5.64 MIL/uL (ref 4.22–5.81)
RDW: 12.7 % (ref 11.5–15.5)
WBC: 8.3 K/uL (ref 4.0–10.5)
nRBC: 0 % (ref 0.0–0.2)

## 2024-01-19 LAB — BASIC METABOLIC PANEL WITH GFR
Anion gap: 10 (ref 5–15)
BUN: 27 mg/dL — ABNORMAL HIGH (ref 6–20)
CO2: 27 mmol/L (ref 22–32)
Calcium: 9.6 mg/dL (ref 8.9–10.3)
Chloride: 101 mmol/L (ref 98–111)
Creatinine, Ser: 1.09 mg/dL (ref 0.61–1.24)
GFR, Estimated: >60 mL/min (ref >60)
Glucose, Bld: 89 mg/dL (ref 70–99)
Potassium: 4.7 mmol/L (ref 3.5–5.1)
Sodium: 138 mmol/L (ref 135–145)

## 2024-01-19 LAB — BRAIN NATRIURETIC PEPTIDE: B Natriuretic Peptide: 12.6 pg/mL (ref 0.0–100.0)

## 2024-01-19 NOTE — Patient Instructions (Signed)
 There has been no changes to your medications.  Labs done today, your results will be available in MyChart, we will contact you for abnormal readings.  Repeat blood work in 3 months.  Genetic testing has been collected, this has to be sent to [place]  for processing and can take 1-2 weeks for us  to get results back.  We will let you know the results once reviewed by your provider.  Your physician recommends that you schedule a follow-up appointment in: 6 months.  If you have any questions or concerns before your next appointment please send us  a message through [place] or call our office at [phone].    TO LEAVE A MESSAGE FOR THE NURSE SELECT OPTION 2, PLEASE LEAVE A MESSAGE INCLUDING: YOUR NAME DATE OF BIRTH CALL BACK NUMBER REASON FOR CALL**this is important as we prioritize the call backs  YOU WILL RECEIVE A CALL BACK THE SAME DAY AS LONG AS YOU CALL BEFORE 4:00 PM  At the Advanced Heart Failure Clinic, you and your health needs are our priority. As part of our continuing mission to provide you with exceptional heart care, we have created designated Provider Care Teams. These Care Teams include your primary Cardiologist (physician) and Advanced Practice Providers (APPs- Physician Assistants and Nurse Practitioners) who all work together to provide you with the care you need, when you need it.   You may see any of the following providers on your designated Care Team at your next follow up: Dr Toribio Fuel Dr Ezra Shuck Dr. Ria Commander Dr. Morene Brownie Amy Lenetta, NP Caffie Shed, [place] [facility] [place], [place] Beckey Coe, NP [place] Lee, NP Ellouise Class, NP Tinnie Redman, PharmD Jaun Bash, PharmD   Please be sure to bring in all your medications bottles to every appointment.    Thank you for choosing [facility]

## 2024-01-19 NOTE — Progress Notes (Signed)
 ADVANCED HF CLINIC NOTE   PCP: Chrystal Lamarr RAMAN, MD HF Cardiology: Dr. Rolan  Chief complaint: CHF  58 y.o. 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  Patient developed high grade heart block in 2021 and had a Medtronic PPM with left bundle lead placed. Cause of heart block is uncertain.  No history of sarcoidosis or Lyme disease.  CT chest in 3/24 showed no pulmonary findings concerning for sarcoidosis.   Echo 4/21: EF 55-60%.  Patient did well until 3/24 when he had an acute PCA CVA.  Echo 3/24: EF 20-25%, normal RV, mild TR.  TEE showed EF 20-25%, no LV thrombus, and negative bubble study.  He was started on Eliquis  due to concern for cardioembolism.   LHC/RHC 5/24: no significant CAD, normal filling pressures, CI 3.32. Cardiac MRI 5/24: mild LV dilation with EF 14%, septal-lateral dyssynchrony, normal RV, nonspecific inferior RV insertion site LGE, small area of mid-wall subepicardial LGE.   Despite left bundle lead, patient had very wide QRS.  He had CS lead placed in 6/24 with significant narrowing of QRS and improvement in symptoms/ exercise tolerance.   Echo 10/24: EF 25-30%, moderate RV dysfunction.    Cardiac PET 10/24: EF 33% and no evidence of active inflammation/sarcoidosis.   Echo was done today and reviewed, EF 40-45%, mild RV dysfunction, normal IVC.    Today he returns for AHF follow up with his wife. He has been stable symptomatically. No lightheadedness.  Can walk up to 3 miles without dyspnea.  Spent several months in [place]  taking care of his grandson while his daughter is in her medical residency.  Was able to play with his grandson with no problems.  No chest pain.  No orthopnea/PND.    ECG (personally reviewed): NSR, BiV pacing  Labs (5/25): BNP 22, K 4.4, creatinine 1.01  PMH: 1. Factor V Leiden heterozygote 2. Seizure disorder 3. High grade heart block: Medtronic PPM with left bundle lead in 2021.  - Despite  left bundle lead, patient had very wide QRS.  He had CS lead placed in 6/24 with significant narrowing of QRS.  4. Hypothyroidism 5. Hyperlipidemia 6. CVA: 3/24 acute PCA CVA, started on Eliquis  with cardiomyopathy.  - Negative bubble study.  7. Chronic systolic CHF: Echo in 4/21 with EF 55-60%.  Echo (3/24) with EF 20-25%, normal RV, mild TR.  Has MDT CRT-P.  - TEE (3/24): EF 20-25%, no LV thrombus, negative bubble study.  - LHC/RHC (5/24): No significant CAD; mean RA 1, PA 22/4, mean PCWP 11, CI 3.32 - Cardiac MRI (5/24): Mild LV dilation with EF 14%, septal-lateral dyssynchrony, normal RV, nonspecific inferior RV insertion site LGE, small area of mid-wall subepicardial LGE.  - Echo (10/24): EF 25-30%, moderate RV dysfunction.  - Cardiac PET (10/24): EF 33% and no evidence of active inflammation/sarcoidosis.  - Echo (9/25): EF 40-45%, mild RV dysfunction, normal IVC.   FH: No cardiomyopathy or sarcoidosis.  Parkinsons in father and brother.   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    ROS: All systems reviewed and negative except as per HPI.   Current Outpatient Medications  Medication Sig Dispense Refill   apixaban  (ELIQUIS ) 5 MG TABS tablet Take 1 tablet (5 mg total) by mouth 2 (two) times daily. 180 tablet 3   Ascorbic Acid (VITAMIN C) 1000 MG tablet Take 1,000 mg by mouth daily. (Patient taking differently: Take 1,000 mg by  mouth daily. As needed Seasonal)     carvedilol  (COREG ) 6.25 MG tablet Take 1 tablet (6.25 mg total) by mouth 2 (two) times daily with a meal. HOLD for SBP less than 90 180 tablet 3   dapagliflozin  propanediol (FARXIGA ) 10 MG TABS tablet Take 1 tablet (10 mg total) by mouth daily before breakfast. 90 tablet 3   lacosamide  (VIMPAT ) 200 MG TABS tablet Take 1 tablet (200 mg total) by mouth 2 (two) times daily. 60 tablet 2   sacubitril -valsartan  (ENTRESTO ) 24-26 MG Take 1 tablet by mouth 2 (two) times daily. 180  tablet 3   spironolactone  (ALDACTONE ) 25 MG tablet Take 1 tablet (25 mg total) by mouth daily. 90 tablet 3   testosterone cypionate (DEPOTESTOSTERONE CYPIONATE) 200 MG/ML injection Inject 0.6 mLs into the muscle See admin instructions. Every 10 days     thyroid  (ARMOUR) 60 MG tablet Take 60 mg by mouth daily before breakfast.     No current facility-administered medications for this encounter.   BP 100/66   Pulse (!) 111   Ht 5' 10 (1.778 m)   Wt 62.7 kg (138 lb 3.2 oz)   SpO2 98%   BMI 19.83 kg/m  PHYSICAL EXAM: General: NAD Neck: No JVD, no thyromegaly or thyroid  nodule.  Lungs: Clear to auscultation bilaterally with normal respiratory effort. CV: Nondisplaced PMI.  Heart regular S1/S2, no S3/S4, no murmur.  No peripheral edema.   Abdomen: Soft, nontender, no hepatosplenomegaly, no distention.  Skin: Intact without lesions or rashes.  Neurologic: Alert and oriented x 3.  Psych: Normal affect. Extremities: No clubbing or cyanosis.  HEENT: Normal.   Wt Readings from Last 3 Encounters:  01/19/24 62.7 kg (138 lb 3.2 oz)  09/15/23 61.6 kg (135 lb 12.8 oz)  08/16/23 62.3 kg (137 lb 6.4 oz)    Assessment/Plan: 1. High grade heart block: Patient has MDT CRT-P device with CS lead.  He had left bundle lead initially, but QRS was still very wide.  QRS much narrower with CS lead.  He is pacer dependent (>99% BiV paced).  Cause of heart block is uncertain.  Not known to have sarcoidosis or to have had Lyme disease.  CTA chest in 3/24 was not suggestive of pulmonary sarcoidosis. Cardiac PET 10/24 showed no signs of active inflammation/sarcoidosis. LMNA cardiomyopathy or SCN5A cardiomyopathy remain possibilities though he has no significant family history of cardiomyopathy or heart block.  - I will arrange for Prevention genetic testing for heritable cardiomyopathies.  We discussed implications of genetic testing.  2. CVA: Acute PCA CVA in 3/24 with some residual difficulty with memory and  reading. 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  No atrial fibrillation detected and bubble study negative. Of note, he is a Factor V Leiden heterozygote.  - Continue Eliquis  due to concern for cardioembolic CVA from occult LV thrombus.  Some improvement in EF today but would continue Eliquis  with Factor V Leiden. CBC today.  3. Chronic systolic CHF: Nonischemic cardiomyopathy, MDT CRT-P.  Echo 3/24: EF 20-25%, normal RV, mild TR.  LHC/RHC 5/24: no significant coronary disease, normal filling pressures, preserved CO.  Cardiomyopathy may have been triggered by long-term pacemaker-mediated dyssychrony (had LB lead initially but paced QRS was quite wide at 180 msec). CS lead placed in 6/24 with significant narrowing of QRS.  Cardiac MRI 5/24 with mild LV dilation with EF 14%, septal-lateral dyssynchrony, normal RV, nonspecific inferior RV insertion site LGE, small area of mid-wall subepicardial LGE. 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 Cardiac PET scan 10/24 showed no evidence for active sarcoidosis, LVEF 33%. Echo 10/24 with EF 25-30%, RV moderately reduced.  Echo today showed some improvement with EF 40-45%, mild RV dysfunction, normal IVC.   In the absence of cardiac sarcoidosis, heart block + dilated cardiomyopathy raises concern for a genetic cardiomyopathy such as LMNA or SCN5A, but he has no significant family history of heart block/cardiomyopathy.  NYHA class I, not volume overloaded on exam.  - Continue Coreg  6.25 mg bid. He did not tolerate higher dose due to orthostatic symptoms.  - Continue dapagliflozin  10 mg daily - Continue spironolactone  25 mg daily.  BMET/BNP today.  - Continue Entresto  to 24-26 bid.  He did not tolerate higher dose due to orthostatic symptoms.  - With improvement in EF, does not need upgrade to CRT-D.  - We discussed genetic testing specifically to look for heritable  cardiomyopathies that could be associated with CHB and cardiomyopathy.  I will send Prevention genetic testing for cardiomyopathies.  This is not an exhaustive screen but does cover some common heritable cardiomyopathies. We discussed implications of genetic testing.  4. Will check lipids today.   Follow up in 6 months with APP  I spent 32 minutes reviewing records, interviewing/examining patient, and managing orders.    Ezra Shuck  01/19/2024

## 2024-01-19 NOTE — Progress Notes (Signed)
  Echocardiogram 2D Echocardiogram has been performed.  Luke Jimenez, RDCS 01/19/2024, 1:30 PM

## 2024-01-19 NOTE — Addendum Note (Signed)
 Encounter addended by: Rolan Ezra RAMAN, MD on: 01/19/2024 2:34 PM  Actions taken: Clinical Note Signed, Level of Service modified

## 2024-01-20 NOTE — Telephone Encounter (Addendum)
 Patient called and informed of Dr. Orvilla recommendations.  For now he wishes to hold off on the medication. Will notify provider.  ----- Message from Ezra Shuck sent at 01/19/2024  4:13 PM EDT ----- LDL is very high.  For long-term health, would recommend Crestor  10 mg daily with lipids/LFTs in 2 months.  ----- Message ----- From: Interface, Lab In [place] Sent: 01/19/2024   2:40 PM EDT To: Ezra GORMAN Shuck, MD

## 2024-02-07 ENCOUNTER — Ambulatory Visit (INDEPENDENT_AMBULATORY_CARE_PROVIDER_SITE_OTHER): Payer: No Typology Code available for payment source

## 2024-02-07 DIAGNOSIS — I5022 Chronic systolic (congestive) heart failure: Secondary | ICD-10-CM

## 2024-02-07 LAB — CUP PACEART REMOTE DEVICE CHECK
Battery Remaining Longevity: 91 mo
Battery Voltage: 3 V
Brady Statistic AP VP Percent: 0.39 %
Brady Statistic AP VS Percent: 0.01 %
Brady Statistic AS VP Percent: 99.41 %
Brady Statistic AS VS Percent: 0.2 %
Brady Statistic RA Percent Paced: 0.4 %
Brady Statistic RV Percent Paced: 99.79 %
Date Time Interrogation Session: 20250922231812
Implantable Lead Connection Status: 753985
Implantable Lead Connection Status: 753985
Implantable Lead Connection Status: 753985
Implantable Lead Implant Date: 20210805
Implantable Lead Implant Date: 20210805
Implantable Lead Implant Date: 20240624
Implantable Lead Location: 753858
Implantable Lead Location: 753859
Implantable Lead Location: 753860
Implantable Lead Model: 3830
Implantable Lead Model: 4598
Implantable Lead Model: 5076
Implantable Pulse Generator Implant Date: 20240624
Lead Channel Impedance Value: 361 Ohm
Lead Channel Impedance Value: 380 Ohm
Lead Channel Impedance Value: 380 Ohm
Lead Channel Impedance Value: 437 Ohm
Lead Channel Impedance Value: 437 Ohm
Lead Channel Impedance Value: 456 Ohm
Lead Channel Impedance Value: 475 Ohm
Lead Channel Impedance Value: 513 Ohm
Lead Channel Impedance Value: 513 Ohm
Lead Channel Impedance Value: 684 Ohm
Lead Channel Impedance Value: 684 Ohm
Lead Channel Impedance Value: 722 Ohm
Lead Channel Impedance Value: 779 Ohm
Lead Channel Impedance Value: 779 Ohm
Lead Channel Pacing Threshold Amplitude: 0.5 V
Lead Channel Pacing Threshold Amplitude: 1.125 V
Lead Channel Pacing Threshold Amplitude: 1.625 V
Lead Channel Pacing Threshold Pulse Width: 0.4 ms
Lead Channel Pacing Threshold Pulse Width: 0.4 ms
Lead Channel Pacing Threshold Pulse Width: 0.4 ms
Lead Channel Sensing Intrinsic Amplitude: 2.375 mV
Lead Channel Sensing Intrinsic Amplitude: 2.375 mV
Lead Channel Sensing Intrinsic Amplitude: 7.75 mV
Lead Channel Setting Pacing Amplitude: 1.5 V
Lead Channel Setting Pacing Amplitude: 2.25 V
Lead Channel Setting Pacing Amplitude: 3 V
Lead Channel Setting Pacing Pulse Width: 0.4 ms
Lead Channel Setting Pacing Pulse Width: 0.4 ms
Lead Channel Setting Sensing Sensitivity: 1.2 mV
Zone Setting Status: 755011
Zone Setting Status: 755011

## 2024-02-08 NOTE — Progress Notes (Signed)
 Remote PPM Transmission

## 2024-02-21 ENCOUNTER — Ambulatory Visit: Payer: Self-pay | Admitting: Cardiovascular Disease

## 2024-03-05 ENCOUNTER — Telehealth (HOSPITAL_COMMUNITY): Payer: Self-pay

## 2024-03-05 NOTE — Telephone Encounter (Signed)
 Restrictions forms faxed to [facility] on 03/05/2024 at [phone]. My chart message sent to patient to inform them

## 2024-04-04 ENCOUNTER — Telehealth (HOSPITAL_COMMUNITY): Payer: Self-pay

## 2024-04-04 DIAGNOSIS — I420 Dilated cardiomyopathy: Secondary | ICD-10-CM

## 2024-04-04 NOTE — Telephone Encounter (Signed)
 Called patient and spouse to inform them of his genetic results. Gave Dr.McLean's recommendations to get referred to Dr. Fairy. Both agreed and happy for referral to sent.

## 2024-04-08 ENCOUNTER — Other Ambulatory Visit (HOSPITAL_COMMUNITY): Payer: Self-pay | Admitting: Cardiology

## 2024-04-19 ENCOUNTER — Other Ambulatory Visit (HOSPITAL_COMMUNITY)

## 2024-04-23 ENCOUNTER — Other Ambulatory Visit (HOSPITAL_COMMUNITY): Payer: Self-pay | Admitting: Cardiology

## 2024-04-27 ENCOUNTER — Ambulatory Visit (HOSPITAL_COMMUNITY): Admission: RE | Admit: 2024-04-27 | Discharge: 2024-04-27 | Attending: Internal Medicine

## 2024-04-27 DIAGNOSIS — I5022 Chronic systolic (congestive) heart failure: Secondary | ICD-10-CM

## 2024-04-27 LAB — BASIC METABOLIC PANEL WITH GFR
Anion gap: 10 (ref 5–15)
BUN: 23 mg/dL — ABNORMAL HIGH (ref 6–20)
CO2: 27 mmol/L (ref 22–32)
Calcium: 9.8 mg/dL (ref 8.9–10.3)
Chloride: 101 mmol/L (ref 98–111)
Creatinine, Ser: 0.99 mg/dL (ref 0.61–1.24)
GFR, Estimated: >60 mL/min (ref >60)
Glucose, Bld: 83 mg/dL (ref 70–99)
Potassium: 4.4 mmol/L (ref 3.5–5.1)
Sodium: 138 mmol/L (ref 135–145)

## 2024-05-08 ENCOUNTER — Ambulatory Visit: Payer: No Typology Code available for payment source

## 2024-05-08 DIAGNOSIS — I420 Dilated cardiomyopathy: Secondary | ICD-10-CM | POA: Diagnosis not present

## 2024-05-11 LAB — CUP PACEART REMOTE DEVICE CHECK
Battery Remaining Longevity: 84 mo
Battery Voltage: 2.99 V
Brady Statistic AP VP Percent: 0.57 %
Brady Statistic AP VS Percent: 0.01 %
Brady Statistic AS VP Percent: 99.12 %
Brady Statistic AS VS Percent: 0.3 %
Brady Statistic RA Percent Paced: 0.59 %
Brady Statistic RV Percent Paced: 99.69 %
Date Time Interrogation Session: 20251224001714
Implantable Lead Connection Status: 753985
Implantable Lead Connection Status: 753985
Implantable Lead Connection Status: 753985
Implantable Lead Implant Date: 20210805
Implantable Lead Implant Date: 20210805
Implantable Lead Implant Date: 20240624
Implantable Lead Location: 753858
Implantable Lead Location: 753859
Implantable Lead Location: 753860
Implantable Lead Model: 3830
Implantable Lead Model: 4598
Implantable Lead Model: 5076
Implantable Pulse Generator Implant Date: 20240624
Lead Channel Impedance Value: 342 Ohm
Lead Channel Impedance Value: 342 Ohm
Lead Channel Impedance Value: 380 Ohm
Lead Channel Impedance Value: 399 Ohm
Lead Channel Impedance Value: 399 Ohm
Lead Channel Impedance Value: 418 Ohm
Lead Channel Impedance Value: 437 Ohm
Lead Channel Impedance Value: 456 Ohm
Lead Channel Impedance Value: 494 Ohm
Lead Channel Impedance Value: 646 Ohm
Lead Channel Impedance Value: 665 Ohm
Lead Channel Impedance Value: 703 Ohm
Lead Channel Impedance Value: 703 Ohm
Lead Channel Impedance Value: 741 Ohm
Lead Channel Pacing Threshold Amplitude: 0.5 V
Lead Channel Pacing Threshold Amplitude: 1.125 V
Lead Channel Pacing Threshold Amplitude: 1.625 V
Lead Channel Pacing Threshold Pulse Width: 0.4 ms
Lead Channel Pacing Threshold Pulse Width: 0.4 ms
Lead Channel Pacing Threshold Pulse Width: 0.4 ms
Lead Channel Sensing Intrinsic Amplitude: 2.375 mV
Lead Channel Sensing Intrinsic Amplitude: 2.375 mV
Lead Channel Sensing Intrinsic Amplitude: 7.75 mV
Lead Channel Setting Pacing Amplitude: 1.5 V
Lead Channel Setting Pacing Amplitude: 2.25 V
Lead Channel Setting Pacing Amplitude: 3 V
Lead Channel Setting Pacing Pulse Width: 0.4 ms
Lead Channel Setting Pacing Pulse Width: 0.4 ms
Lead Channel Setting Sensing Sensitivity: 1.2 mV
Zone Setting Status: 755011
Zone Setting Status: 755011

## 2024-05-11 NOTE — Progress Notes (Signed)
 Remote PPM Transmission

## 2024-05-15 ENCOUNTER — Other Ambulatory Visit (HOSPITAL_COMMUNITY): Payer: Self-pay | Admitting: Cardiology

## 2024-05-16 ENCOUNTER — Ambulatory Visit: Payer: Self-pay | Admitting: Cardiovascular Disease

## 2024-07-18 ENCOUNTER — Encounter (HOSPITAL_COMMUNITY)
